# Patient Record
Sex: Female | Born: 1935
Health system: Southern US, Community
[De-identification: ages and names within clinical notes are randomized; demographics above are authoritative.]

## PROBLEM LIST (undated history)

## (undated) DIAGNOSIS — F0390 Unspecified dementia without behavioral disturbance: Secondary | ICD-10-CM

## (undated) DIAGNOSIS — M660 Rupture of popliteal cyst: Secondary | ICD-10-CM

## (undated) DIAGNOSIS — M5136 Other intervertebral disc degeneration, lumbar region: Secondary | ICD-10-CM

## (undated) DIAGNOSIS — I10 Essential (primary) hypertension: Secondary | ICD-10-CM

## (undated) DIAGNOSIS — M51369 Other intervertebral disc degeneration, lumbar region without mention of lumbar back pain or lower extremity pain: Secondary | ICD-10-CM

## (undated) DIAGNOSIS — N200 Calculus of kidney: Secondary | ICD-10-CM

## (undated) DIAGNOSIS — G243 Spasmodic torticollis: Secondary | ICD-10-CM

## (undated) DIAGNOSIS — Z8619 Personal history of other infectious and parasitic diseases: Secondary | ICD-10-CM

## (undated) DIAGNOSIS — I2510 Atherosclerotic heart disease of native coronary artery without angina pectoris: Secondary | ICD-10-CM

## (undated) DIAGNOSIS — E785 Hyperlipidemia, unspecified: Secondary | ICD-10-CM

## (undated) HISTORY — PX: CHOLECYSTECTOMY: SHX55

## (undated) HISTORY — PX: CARPAL TUNNEL RELEASE: SHX101

## (undated) HISTORY — PX: KIDNEY STONE SURGERY: SHX686

## (undated) HISTORY — PX: ABDOMINAL HYSTERECTOMY: SHX81

## (undated) HISTORY — PX: LUMBAR LAMINECTOMY: SHX95

---

## 1996-03-23 HISTORY — PX: CORONARY ARTERY BYPASS GRAFT: SHX141

## 2003-03-14 ENCOUNTER — Encounter (HOSPITAL_COMMUNITY): Admission: RE | Admit: 2003-03-14 | Discharge: 2003-03-23 | Payer: Self-pay | Admitting: Orthopedic Surgery

## 2003-03-27 ENCOUNTER — Encounter (HOSPITAL_COMMUNITY): Admission: RE | Admit: 2003-03-27 | Discharge: 2003-04-26 | Payer: Self-pay | Admitting: Orthopedic Surgery

## 2010-06-19 DIAGNOSIS — I498 Other specified cardiac arrhythmias: Secondary | ICD-10-CM

## 2011-05-22 DIAGNOSIS — R0602 Shortness of breath: Secondary | ICD-10-CM

## 2011-05-22 DIAGNOSIS — R079 Chest pain, unspecified: Secondary | ICD-10-CM

## 2011-05-25 ENCOUNTER — Inpatient Hospital Stay (HOSPITAL_COMMUNITY)
Admission: AD | Admit: 2011-05-25 | Discharge: 2011-05-27 | DRG: 287 | Disposition: A | Discharge status: Home / Self Care | Payer: Medicare Other | Source: Other Acute Inpatient Hospital | Attending: Cardiology | Admitting: Cardiology

## 2011-05-25 ENCOUNTER — Encounter (HOSPITAL_COMMUNITY): Payer: Self-pay | Admitting: Cardiology

## 2011-05-25 DIAGNOSIS — Z951 Presence of aortocoronary bypass graft: Secondary | ICD-10-CM

## 2011-05-25 DIAGNOSIS — Z79899 Other long term (current) drug therapy: Secondary | ICD-10-CM

## 2011-05-25 DIAGNOSIS — E785 Hyperlipidemia, unspecified: Secondary | ICD-10-CM

## 2011-05-25 DIAGNOSIS — I2510 Atherosclerotic heart disease of native coronary artery without angina pectoris: Secondary | ICD-10-CM

## 2011-05-25 DIAGNOSIS — K449 Diaphragmatic hernia without obstruction or gangrene: Secondary | ICD-10-CM | POA: Diagnosis present

## 2011-05-25 DIAGNOSIS — D649 Anemia, unspecified: Secondary | ICD-10-CM | POA: Diagnosis present

## 2011-05-25 DIAGNOSIS — I10 Essential (primary) hypertension: Secondary | ICD-10-CM | POA: Diagnosis present

## 2011-05-25 DIAGNOSIS — M5136 Other intervertebral disc degeneration, lumbar region: Secondary | ICD-10-CM | POA: Insufficient documentation

## 2011-05-25 DIAGNOSIS — K219 Gastro-esophageal reflux disease without esophagitis: Secondary | ICD-10-CM | POA: Diagnosis present

## 2011-05-25 DIAGNOSIS — R079 Chest pain, unspecified: Principal | ICD-10-CM | POA: Diagnosis present

## 2011-05-25 DIAGNOSIS — F4321 Adjustment disorder with depressed mood: Secondary | ICD-10-CM | POA: Diagnosis present

## 2011-05-25 DIAGNOSIS — Z7982 Long term (current) use of aspirin: Secondary | ICD-10-CM

## 2011-05-25 DIAGNOSIS — Z833 Family history of diabetes mellitus: Secondary | ICD-10-CM

## 2011-05-25 DIAGNOSIS — F0390 Unspecified dementia without behavioral disturbance: Secondary | ICD-10-CM | POA: Insufficient documentation

## 2011-05-25 DIAGNOSIS — N200 Calculus of kidney: Secondary | ICD-10-CM | POA: Insufficient documentation

## 2011-05-25 HISTORY — DX: Hyperlipidemia, unspecified: E78.5

## 2011-05-25 HISTORY — DX: Calculus of kidney: N20.0

## 2011-05-25 HISTORY — DX: Spasmodic torticollis: G24.3

## 2011-05-25 HISTORY — DX: Other intervertebral disc degeneration, lumbar region without mention of lumbar back pain or lower extremity pain: M51.369

## 2011-05-25 HISTORY — DX: Essential (primary) hypertension: I10

## 2011-05-25 HISTORY — DX: Unspecified dementia, unspecified severity, without behavioral disturbance, psychotic disturbance, mood disturbance, and anxiety: F03.90

## 2011-05-25 HISTORY — DX: Atherosclerotic heart disease of native coronary artery without angina pectoris: I25.10

## 2011-05-25 HISTORY — DX: Other intervertebral disc degeneration, lumbar region: M51.36

## 2011-05-25 HISTORY — DX: Personal history of other infectious and parasitic diseases: Z86.19

## 2011-05-25 HISTORY — DX: Rupture of popliteal cyst: M66.0

## 2011-05-25 LAB — CARDIAC PANEL(CRET KIN+CKTOT+MB+TROPI): Total CK: 167 U/L (ref 7–177)

## 2011-05-25 LAB — COMPREHENSIVE METABOLIC PANEL
ALT: 10 U/L (ref 0–35)
Alkaline Phosphatase: 78 U/L (ref 39–117)
CO2: 25 mEq/L (ref 19–32)
Calcium: 9 mg/dL (ref 8.4–10.5)
GFR calc Af Amer: 53 mL/min — ABNORMAL LOW (ref >90)
GFR calc non Af Amer: 46 mL/min — ABNORMAL LOW (ref >90)
Glucose, Bld: 109 mg/dL — ABNORMAL HIGH (ref 70–99)
Sodium: 139 mEq/L (ref 135–145)
Total Bilirubin: 0.2 mg/dL — ABNORMAL LOW (ref 0.3–1.2)

## 2011-05-25 LAB — CBC
HCT: 29.3 % — ABNORMAL LOW (ref 36.0–46.0)
MCV: 86.4 fL (ref 78.0–100.0)
Platelets: 217 10*3/uL (ref 150–400)
RBC: 3.39 MIL/uL — ABNORMAL LOW (ref 3.87–5.11)
WBC: 4.4 10*3/uL (ref 4.0–10.5)

## 2011-05-25 LAB — DIFFERENTIAL
Eosinophils Relative: 3 % (ref 0–5)
Lymphocytes Relative: 36 % (ref 12–46)
Lymphs Abs: 1.6 10*3/uL (ref 0.7–4.0)

## 2011-05-25 LAB — MRSA PCR SCREENING: MRSA by PCR: NEGATIVE

## 2011-05-25 MED ORDER — NITROGLYCERIN 0.4 MG SL SUBL: 0.4000 mg | SUBLINGUAL_TABLET | SUBLINGUAL | Status: DC | PRN

## 2011-05-25 MED ORDER — DIAZEPAM 5 MG PO TABS
5.0000 mg | ORAL_TABLET | ORAL | Status: AC
  Administered 2011-05-26: 5 mg via ORAL
  Filled 2011-05-25: qty 1

## 2011-05-25 MED ORDER — ASPIRIN 81 MG PO CHEW: 81.0000 mg | CHEWABLE_TABLET | Freq: Every day | ORAL | Status: DC

## 2011-05-25 MED ORDER — SODIUM CHLORIDE 0.9 % IV SOLN
INTRAVENOUS | Status: DC
  Administered 2011-05-25: via INTRAVENOUS

## 2011-05-25 MED ORDER — ASPIRIN EC 81 MG PO TBEC
81.0000 mg | DELAYED_RELEASE_TABLET | Freq: Every day | ORAL | Status: DC
  Administered 2011-05-26 – 2011-05-27 (×2): 81 mg via ORAL
  Filled 2011-05-25 (×2): qty 1

## 2011-05-25 MED ORDER — OMEGA-3-ACID ETHYL ESTERS 1 G PO CAPS
2.0000 g | ORAL_CAPSULE | Freq: Two times a day (BID) | ORAL | Status: DC
  Administered 2011-05-25 – 2011-05-27 (×4): 2 g via ORAL
  Filled 2011-05-25 (×6): qty 2

## 2011-05-25 MED ORDER — LISINOPRIL 20 MG PO TABS
20.0000 mg | ORAL_TABLET | Freq: Every day | ORAL | Status: DC
  Administered 2011-05-25 – 2011-05-27 (×3): 20 mg via ORAL
  Filled 2011-05-25 (×3): qty 1

## 2011-05-25 MED ORDER — ACETAMINOPHEN 325 MG PO TABS: 650.0000 mg | ORAL_TABLET | ORAL | Status: DC | PRN

## 2011-05-25 MED ORDER — ONDANSETRON HCL 4 MG/2ML IJ SOLN
4.0000 mg | Freq: Four times a day (QID) | INTRAMUSCULAR | Status: DC | PRN
  Administered 2011-05-25: 4 mg via INTRAVENOUS
  Filled 2011-05-25: qty 2

## 2011-05-25 MED ORDER — MECLIZINE HCL 12.5 MG PO TABS
12.5000 mg | ORAL_TABLET | Freq: Every day | ORAL | Status: DC | PRN
  Filled 2011-05-25: qty 1

## 2011-05-25 MED ORDER — NITROGLYCERIN IN D5W 200-5 MCG/ML-% IV SOLN
10.0000 ug/min | INTRAVENOUS | Status: DC
  Administered 2011-05-25: 10 ug/min via INTRAVENOUS

## 2011-05-25 MED ORDER — PANTOPRAZOLE SODIUM 40 MG PO TBEC
40.0000 mg | DELAYED_RELEASE_TABLET | Freq: Every day | ORAL | Status: DC
  Administered 2011-05-25 – 2011-05-27 (×3): 40 mg via ORAL
  Filled 2011-05-25 (×3): qty 1

## 2011-05-25 MED ORDER — ENOXAPARIN SODIUM 40 MG/0.4ML ~~LOC~~ SOLN
40.0000 mg | SUBCUTANEOUS | Status: DC
  Administered 2011-05-25 – 2011-05-26 (×2): 40 mg via SUBCUTANEOUS
  Filled 2011-05-25 (×4): qty 0.4

## 2011-05-25 MED ORDER — GEMFIBROZIL 600 MG PO TABS
600.0000 mg | ORAL_TABLET | Freq: Two times a day (BID) | ORAL | Status: DC
  Administered 2011-05-26 – 2011-05-27 (×3): 600 mg via ORAL
  Filled 2011-05-25 (×6): qty 1

## 2011-05-25 MED ORDER — VITAMIN C 500 MG PO TABS
500.0000 mg | ORAL_TABLET | Freq: Every day | ORAL | Status: DC
  Administered 2011-05-25 – 2011-05-27 (×3): 500 mg via ORAL
  Filled 2011-05-25 (×3): qty 1

## 2011-05-25 NOTE — H&P (Signed)
Admit date: 05/25/2011 Name:  Gloria Rowland Medical record number: [id number] DOB/Age:  76-Jul-1934  76 y.o.  Primary Cardiologist: Dr. Viann Fish  Primary Physician: Dr. Loreen Freud  Chief complaint/reason for admission:  Chest pain and shortness of breath  HPI:  This 78 year old female has a history of coronary artery disease with mammary bypass grafting to the LAD and diagonal branch in 1998. At that time she had insignificant disease involving her right coronary artery and circumflex. She said negative Cardiolite since then the last being in 2008. She has had significant anxiety as well as chronic low back pain and has been diagnosed with dementia. She has been on Aricept and Celexa over the past year. She developed dyspnea while in church about a week ago and presented to [facility] on March 1 with worsening dyspnea and was noted to be hyperventilating. She complained of anterior chest pain and pressure like pain radiating through to her back. She had a mildly elevated d-dimer but a negative CT scan and had an echocardiogram during pain that was normal. Over the weekend she is continued to complain of chest discomfort with normal EKGs during pain and was transferred here for further evaluation.   Past Medical History  Diagnosis Date  . CAD (coronary artery disease)   . Hyperlipidemia   . Hypertension   . Nephrolithiasis   . Lumbar degenerative disc disease   . Spasmodic torticollis   . History of hepatitis   . Baker's cyst, ruptured     Past Surgical History  Procedure Date  . Coronary artery bypass graft 1998    LIMA to LAD-Dx  . Carpal tunnel release   . Abdominal hysterectomy   . Lumbar laminectomy   . Kidney stone surgery   . Cholecystectomy     Allergies: is allergic to aspirin and codeine.    Medications:  Prior to Admission medications   Medication Sig Start Date End Date Taking? Authorizing Provider  aspirin 81 MG chewable tablet Chew 81 mg by mouth  daily.   Yes Historical Provider, MD  citalopram (CELEXA) 10 MG tablet Take 10 mg by mouth daily.   Yes Historical Provider, MD  donepezil (ARICEPT) 10 MG tablet Take 10 mg by mouth daily.   Yes Historical Provider, MD  gemfibrozil (LOPID) 600 MG tablet Take 600 mg by mouth 2 (two) times daily before a meal.   Yes Historical Provider, MD  lisinopril (PRINIVIL,ZESTRIL) 20 MG tablet Take 20 mg by mouth daily.   Yes Historical Provider, MD  meclizine (ANTIVERT) 12.5 MG tablet Take 12.5 mg by mouth daily as needed. For headache,dizziness   Yes Historical Provider, MD  nitroGLYCERIN (NITROSTAT) 0.4 MG SL tablet Place 0.4 mg under the tongue every 5 (five) minutes as needed. For chest pain   Yes Historical Provider, MD  omega-3 acid ethyl esters (LOVAZA) 1 G capsule Take 2 g by mouth 2 (two) times daily.   Yes Historical Provider, MD  omeprazole (PRILOSEC) 20 MG capsule Take 20 mg by mouth 2 (two) times daily.   Yes Historical Provider, MD  vitamin C (ASCORBIC ACID) 500 MG tablet Take 500 mg by mouth daily.   Yes Historical Provider, MD   Family History:  Positive for heart disease and diabetes  Social History:   reports that she has never smoked. She has never used smokeless tobacco. She reports that she does not drink alcohol or use illicit drugs.   History   Social History Narrative   Divorced, remarried.  Review of Systems:  Psychological ROS: Significant history of depression as well as anxiety over the past. Respiratory ROS: She has significant dyspnea and exertion gives out with most any level of activity. Cardiovascular ROS: She does not have any significant palpitations but has had some mild bradycardia. No PND or orthopnea. Gastrointestinal ROS: She does have some reflux and has significant burping at times. Genito-Urinary ROS: She has urinary incontinence and wears a pessary. Musculoskeletal ROS: Significant low back pain that she is limited with and has affected her  gait Neurological ROS: She does have some memory loss and was placed on Aricept but was recently taken off of this. Other than as noted above, the remainder of the review of systems is normal  Physical Exam: BP 118/63  Pulse 55  Temp(Src) 98 F (36.7 C) (Oral)  Resp 14  Ht 5\' 5"  (1.651 m)  Wt 65 kg (143 lb 4.8 oz)  BMI 23.85 kg/m2  SpO2 99% General appearance: alert, appears older than stated age and no distress Head: Normocephalic, without obvious abnormality, atraumatic Eyes: conjunctivae/corneas clear. PERRL, EOM's intact. Fundi benign. Throat: lips, mucosa, and tongue normal; teeth and gums normal Neck: no adenopathy, no carotid bruit, no JVD and supple, symmetrical, trachea midline Back: symmetric, no curvature. ROM normal. No CVA tenderness. Lungs: clear to auscultation bilaterally Heart: regular rate and rhythm, S1, S2 normal, no murmur, click, rub or gallop Abdomen: soft, non-tender; bowel sounds normal; no masses,  no organomegaly Pelvic: deferred Extremities: No edema present. Complains of mild tenderness in her lower legs Pulses: Posterior tibial pulses are 2+ bilaterally dorsalis pedis are 1+, no femoral bruits noted Skin: Skin color, texture, turgor normal. No rashes or lesions Neurologic: Grossly normal she is oriented to date as well as the hospital  EKG: Normal  IMPRESSIONS:  1. Ongoing chest pain with some features suggestive of unstable angina. Despite a negative echocardiogram and EKG during pain, she has ongoing symptoms of dyspnea and chest pain and we will need to reassess her for coronary artery disease. 2. Anemia 3. Hypertension 4. Hyperlipidemia under treatment 5. Dementia 6 French anxiety and depression 7. History of coronary artery disease with severe LAD disease treated with mammary graft to the LAD  PLAN: She is admitted to a step down bed because of ongoing chest pain. We will plan to proceed with cardiac catheterization in the morning.  Cardiaccatheterization was discussed with the patient fully including risks of myocardial infarction, death, stroke, bleeding, arrhythmia, dye allergy, renal insufficiency or bleeding.  The patient understands and is willing to proceed. She does have anemia and we will need to reevaluate her for this. Add proton pump inhibitor.   Signed: Darden Palmer MD [facility]  05/25/2011, 5:33 PM

## 2011-05-26 ENCOUNTER — Encounter (HOSPITAL_COMMUNITY)
Admission: AD | Disposition: A | Discharge status: Home / Self Care | Payer: Self-pay | Source: Other Acute Inpatient Hospital | Attending: Cardiology

## 2011-05-26 ENCOUNTER — Encounter (HOSPITAL_COMMUNITY): Payer: Self-pay | Admitting: Cardiology

## 2011-05-26 HISTORY — PX: LEFT HEART CATHETERIZATION WITH CORONARY/GRAFT ANGIOGRAM: SHX5450

## 2011-05-26 LAB — LIPID PANEL
Cholesterol: 187 mg/dL (ref 0–200)
LDL Cholesterol: 97 mg/dL (ref 0–99)
VLDL: 50 mg/dL — ABNORMAL HIGH (ref 0–40)

## 2011-05-26 SURGERY — LEFT HEART CATHETERIZATION WITH CORONARY/GRAFT ANGIOGRAM
Anesthesia: LOCAL

## 2011-05-26 MED ORDER — MAGNESIUM HYDROXIDE 400 MG/5ML PO SUSP
30.0000 mL | Freq: Every day | ORAL | Status: DC | PRN
  Administered 2011-05-26: 30 mL via ORAL
  Filled 2011-05-26: qty 30

## 2011-05-26 MED ORDER — ACETAMINOPHEN 325 MG PO TABS: 650.0000 mg | ORAL_TABLET | ORAL | Status: DC | PRN

## 2011-05-26 MED ORDER — HEPARIN (PORCINE) IN NACL 2-0.9 UNIT/ML-% IJ SOLN
INTRAMUSCULAR | Status: AC
  Filled 2011-05-26: qty 2000

## 2011-05-26 MED ORDER — AMLODIPINE BESYLATE 5 MG PO TABS
5.0000 mg | ORAL_TABLET | Freq: Every day | ORAL | Status: DC
  Administered 2011-05-26 – 2011-05-27 (×2): 5 mg via ORAL
  Filled 2011-05-26 (×2): qty 1

## 2011-05-26 MED ORDER — NITROGLYCERIN 0.2 MG/ML ON CALL CATH LAB
INTRAVENOUS | Status: AC
  Filled 2011-05-26: qty 1

## 2011-05-26 MED ORDER — SODIUM CHLORIDE 0.9 % IV SOLN: 1.0000 mL/kg/h | INTRAVENOUS | Status: DC

## 2011-05-26 MED ORDER — LIDOCAINE HCL (PF) 1 % IJ SOLN
INTRAMUSCULAR | Status: AC
  Filled 2011-05-26: qty 30

## 2011-05-26 MED ORDER — SODIUM CHLORIDE 0.9 % IV SOLN
1.0000 mL/kg/h | INTRAVENOUS | Status: AC
  Administered 2011-05-26: 65 mL/h via INTRAVENOUS

## 2011-05-26 MED FILL — Morphine Sulfate Inj 10 MG/ML: INTRAMUSCULAR | Qty: 1 | Status: AC

## 2011-05-26 MED FILL — Nitroglycerin SL Tab 0.4 MG: SUBLINGUAL | Qty: 1 | Status: AC

## 2011-05-26 MED FILL — Nitroglycerin IV Soln 200 MCG/ML in D5W: INTRAVENOUS | Qty: 250 | Status: AC

## 2011-05-26 NOTE — Progress Notes (Signed)
Pt returned back from Cath Lab, VSS, pt resting and comfortable, pt assessment done

## 2011-05-26 NOTE — Consult Note (Signed)
[facility] Consult Note  Referring Provider: No ref. provider found Primary Care Physician:  Ignatius Specking., MD, MD Primary Gastroenterologist:  Dr.  Antony Contras Complaint: Shortness of breath and chest tightness HPI: Gloria Rowland is an 78 y.o. white female.  who was transferred here from [facility] after a workup for the above symptoms. She first noted some very transient chest pain while in church 9 days ago. This subsided but since then she began to notice a chest tightness radiating to the back with shortness of breath. She notes no radiation to the arms and no association to meals or bowel movements. She does not have any dysphagia. She has chronic reflux and is familiar with those symptoms and they are well suppressed on Prevacid that she has been taking twice a day for some time. After her symptoms persisted she was seen by her primary physician who admitted her for a work up about 4 days ago. She had several tests outlined by Dr. Donnie Aho apparently including an abdominal and possibly chest CT scan and echocardiogram there were all unrevealing. It was decided to transfer her here for cardiac catheterization which was done today and is apparently normal.  Her cardiac catheterization apparently revealed no specific abnormality we're consulted for possible esophageal or GI source of her pain. She's had a cholecystectomy in the remote past. As mentioned above there is no association of her pain to meals and she has been able to eat normally previous and clearly distinguish as her current symptoms from familiar reflux.     Past Medical History  Diagnosis Date  . CAD (coronary artery disease)   . Hyperlipidemia   . Hypertension   . Nephrolithiasis   . Lumbar degenerative disc disease   . Spasmodic torticollis   . History of hepatitis   . Baker's cyst, ruptured     Past Surgical History  Procedure Date  . Coronary artery bypass graft 1998    LIMA to LAD-Dx  . Carpal  tunnel release   . Abdominal hysterectomy   . Lumbar laminectomy   . Kidney stone surgery   . Cholecystectomy     Medications Prior to Admission  Medication Dose Route Frequency Provider Last Rate Last Dose  . 0.9 %  sodium chloride infusion  1 mL/kg/hr Intravenous Continuous W Ashley Royalty., MD 65 mL/hr at 05/26/11 1050 65 mL/hr at 05/26/11 1050  . acetaminophen (TYLENOL) tablet 650 mg  650 mg Oral Q4H PRN W Ashley Royalty., MD      . aspirin EC tablet 81 mg  81 mg Oral Daily W Ashley Royalty., MD   81 mg at 05/26/11 1049  . diazepam (VALIUM) tablet 5 mg  5 mg Oral On Call W Ashley Royalty., MD   5 mg at 05/26/11 [phone]  . enoxaparin (LOVENOX) injection 40 mg  40 mg Subcutaneous Q24H W Ashley Royalty., MD   40 mg at 05/25/11 1837  . gemfibrozil (LOPID) tablet 600 mg  600 mg Oral BID AC W Ashley Royalty., MD   600 mg at 05/26/11 1049  . heparin 2-0.9 UNIT/ML-% infusion           . lidocaine (XYLOCAINE) 1 % injection           . lisinopril (PRINIVIL,ZESTRIL) tablet 20 mg  20 mg Oral Daily W Ashley Royalty., MD   20 mg at 05/26/11 1049  . meclizine (ANTIVERT) tablet 12.5 mg  12.5 mg Oral Daily PRN W Viann Fish  Montez Hageman., MD      . nitroGLYCERIN (NITROSTAT) SL tablet 0.4 mg  0.4 mg Sublingual Q5 Min x 3 PRN W Ashley Royalty., MD      . nitroGLYCERIN (NTG ON-CALL) 0.2 mg/mL injection           . nitroGLYCERIN 0.2 mg/mL in dextrose 5 % infusion  10 mcg/min Intravenous Continuous W Ashley Royalty., MD 3 mL/hr at 05/26/11 0600 10 mcg/min at 05/26/11 0600  . omega-3 acid ethyl esters (LOVAZA) capsule 2 g  2 g Oral BID W Ashley Royalty., MD   2 g at 05/26/11 1049  . ondansetron (ZOFRAN) injection 4 mg  4 mg Intravenous Q6H PRN W Ashley Royalty., MD   4 mg at 05/25/11 2013  . pantoprazole (PROTONIX) EC tablet 40 mg  40 mg Oral Q1200 W Ashley Royalty., MD   40 mg at 05/25/11 1836  . vitamin C (ASCORBIC ACID) tablet 500 mg  500 mg Oral Daily W Ashley Royalty., MD    500 mg at 05/26/11 1049  . DISCONTD: 0.9 %  sodium chloride infusion   Intravenous Continuous W Ashley Royalty., MD 50 mL/hr at 05/25/11 2335    . DISCONTD: 0.9 %  sodium chloride infusion  1 mL/kg/hr Intravenous Continuous W Ashley Royalty., MD      . DISCONTD: acetaminophen (TYLENOL) tablet 650 mg  650 mg Oral Q4H PRN W Ashley Royalty., MD      . DISCONTD: aspirin chewable tablet 81 mg  81 mg Oral Daily W Ashley Royalty., MD      . DISCONTD: nitroGLYCERIN (NITROSTAT) SL tablet 0.4 mg  0.4 mg Sublingual Q5 min PRN W Ashley Royalty., MD       No current outpatient prescriptions on file as of 05/26/2011.    Allergies:  Allergies  Allergen Reactions  . Aspirin     Upset stomach  . Codeine     Upset stomach    Family History  Problem Relation Age of Onset  . Diabetes Sister   . Diabetes Sister     Social History:  reports that she has never smoked. She has never used smokeless tobacco. She reports that she does not drink alcohol or use illicit drugs.  Negative except for as above   Blood pressure 135/81, pulse 46, temperature 98.6 F (37 C), temperature source Oral, resp. rate 17, height 5\' 5"  (1.651 m), weight 65 kg (143 lb 4.8 oz), SpO2 100.00%. Head: Normocephalic, without obvious abnormality, atraumatic Neck: no adenopathy, no carotid bruit, no JVD, supple, symmetrical, trachea midline and thyroid not enlarged, symmetric, no tenderness/mass/nodules Resp: clear to auscultation bilaterally Cardio: regular rate and rhythm, S1, S2 normal, no murmur, click, rub or gallop GI: Abdomen soft nondistended with normoactive bowel sounds no hepatomegaly masses or guarding. Extremities: extremities normal, atraumatic, no cyanosis or edema  Results for orders placed during the hospital encounter of 05/25/11 (from the past 48 hour(s))  MRSA PCR SCREENING     Status: Normal   Collection Time   05/25/11  4:15 PM      Component Value Range Comment   MRSA by PCR NEGATIVE  NEGATIVE     CARDIAC PANEL(CRET KIN+CKTOT+MB+TROPI)     Status: Normal   Collection Time   05/25/11  6:09 PM      Component Value Range Comment   Total CK 167  7 - 177 (U/L)    CK, MB 3.3  0.3 - 4.0 (ng/mL)  Troponin I <0.30  <0.30 (ng/mL)    Relative Index 2.0  0.0 - 2.5    PROTIME-INR     Status: Normal   Collection Time   05/25/11  6:09 PM      Component Value Range Comment   Prothrombin Time 14.2  11.6 - 15.2 (seconds)    INR 1.08  0.00 - 1.49    APTT     Status: Normal   Collection Time   05/25/11  6:09 PM      Component Value Range Comment   aPTT 29  24 - 37 (seconds)   CBC     Status: Abnormal   Collection Time   05/25/11  6:09 PM      Component Value Range Comment   WBC 4.4  4.0 - 10.5 (K/uL)    RBC 3.39 (*) 3.87 - 5.11 (MIL/uL)    Hemoglobin 10.0 (*) 12.0 - 15.0 (g/dL)    HCT 16.1 (*) 09.6 - 46.0 (%)    MCV 86.4  78.0 - 100.0 (fL)    MCH 29.5  26.0 - 34.0 (pg)    MCHC 34.1  30.0 - 36.0 (g/dL)    RDW 04.5  40.9 - 81.1 (%)    Platelets 217  150 - 400 (K/uL)   DIFFERENTIAL     Status: Normal   Collection Time   05/25/11  6:09 PM      Component Value Range Comment   Neutrophils Relative 51  43 - 77 (%)    Neutro Abs 2.3  1.7 - 7.7 (K/uL)    Lymphocytes Relative 36  12 - 46 (%)    Lymphs Abs 1.6  0.7 - 4.0 (K/uL)    Monocytes Relative 10  3 - 12 (%)    Monocytes Absolute 0.4  0.1 - 1.0 (K/uL)    Eosinophils Relative 3  0 - 5 (%)    Eosinophils Absolute 0.1  0.0 - 0.7 (K/uL)    Basophils Relative 0  0 - 1 (%)    Basophils Absolute 0.0  0.0 - 0.1 (K/uL)   COMPREHENSIVE METABOLIC PANEL     Status: Abnormal   Collection Time   05/25/11  6:09 PM      Component Value Range Comment   Sodium 139  135 - 145 (mEq/L)    Potassium 3.6  3.5 - 5.1 (mEq/L)    Chloride 104  96 - 112 (mEq/L)    CO2 25  19 - 32 (mEq/L)    Glucose, Bld 109 (*) 70 - 99 (mg/dL)    BUN 18  6 - 23 (mg/dL)    Creatinine, Ser 9.14 (*) 0.50 - 1.10 (mg/dL)    Calcium 9.0  8.4 - 10.5 (mg/dL)    Total Protein 6.6  6.0  - 8.3 (g/dL)    Albumin 3.3 (*) 3.5 - 5.2 (g/dL)    AST 17  0 - 37 (U/L)    ALT 10  0 - 35 (U/L)    Alkaline Phosphatase 78  39 - 117 (U/L)    Total Bilirubin 0.2 (*) 0.3 - 1.2 (mg/dL)    GFR calc non Af Amer 46 (*) >90 (mL/min)    GFR calc Af Amer 53 (*) >90 (mL/min)   HEMOGLOBIN A1C     Status: Abnormal   Collection Time   05/25/11  6:09 PM      Component Value Range Comment   Hemoglobin A1C 6.0 (*) <5.7 (%)    Mean Plasma Glucose 126 (*) <117 (  mg/dL)   LIPID PANEL     Status: Abnormal   Collection Time   05/26/11  4:00 AM      Component Value Range Comment   Cholesterol 187  0 - 200 (mg/dL)    Triglycerides 161 (*) <150 (mg/dL)    HDL 40  >09 (mg/dL)    Total CHOL/HDL Ratio 4.7      VLDL 50 (*) 0 - 40 (mg/dL)    LDL Cholesterol 97  0 - 99 (mg/dL)    No results found.  Assessment: Chest discomfort and dyspnea with no features to suggest an esophageal or otherwise gastrointestinal etiology. Plan:  As she is already on double dose proton pump inhibitor and her symptoms being atypical for an esophageal etiology, no definite recommendations from a GI standpoint.. It would not be unreasonable to obtain an upper GI series to rule out a large hiatal hernia although I would expect this would've been picked up on the CT scan which apparently was done at the referring hospital. Please call to discuss if needed at [phone]. Will followup tomorrow as well. Jyra Lagares C 05/26/2011, 11:40 AM

## 2011-05-26 NOTE — Interval H&P Note (Signed)
History and Physical Interval Note:  05/26/2011 7:47 AM  Gloria Rowland  has presented today for surgery, with the diagnosis of chest pain  The various methods of treatment have been discussed with the patient and family. After consideration of risks, benefits and other options for treatment, the patient has consented to  Procedure(s) (LRB): LEFT HEART CATHETERIZATION WITH CORONARY/GRAFT ANGIOGRAM (N/A) as a surgical intervention .  The patients' history has been reviewed, patient examined, no change in status, stable for surgery.  I have reviewed the patients' chart and labs.  Questions were answered to the patient's satisfaction.     Gloria Rowland,W SPENCER    Patient seen and examined.  No interval change in history and exam since last note.  Stable for procedure.  Darden Palmer. MD [facility]  05/26/2011

## 2011-05-26 NOTE — CV Procedure (Signed)
Cardiac Catheterization Report   Gloria Rowland  [birth year]  [id number]  05/26/2011   PROCEDURE:  Left heart catheterization with selective coronary angiography, left ventriculogram.  INDICATIONS:  Unstable angina, prior coronary bypass grafting  The risks, benefits, and details of the procedure were explained to the patient.  The patient verbalized understanding and wanted to proceed.  Informed written consent was obtained.  PROCEDURE TECHNIQUE:  After Xylocaine anesthesia a 1F sheath was placed in the right femoral artery with a single anterior needle wall stick.   Left coronary angiography was done using a Judkins L4 guide catheter.  Right coronary angiography was done using a no torque Judkins R4 guide catheter. A mammary catheter was selected using a internal mammary catheter. A pigtail catheter was placed the right ventricle and a 30 cc ventriculogram was performed and she was taken to the holding area for sheath removal. She tolerated the procedure well.   CONTRAST:  Total of 80 cc.  COMPLICATIONS:  None.    HEMODYNAMICS:  Aortic postcontrast 152/69 LV postcontrast 152/6-14.  There was no gradient between the left ventricle and aorta.    ANGIOGRAPHIC DATA:    CORONARY ARTERIES:   Arise and distribute normally.  Right dominant. Significant coronary calcification is noted.  Left main coronary artery: Mild calcification of the distal left main with mild 10- 20% narrowing..  Left anterior descending: Calcified and occluded proximally. Fills by collaterals from the sequential mammary graft.  Circumflex coronary artery: Calcified with moderate ectasia and tortuosity but no significant focal narrowing noted..  Right coronary artery: Calcified somewhat. There is a moderate 40% ostial stenosis. Moderate irregularities noted but no significant obstructive stenosis is noted.  Internal mammary graft to LAD/diagonal: Widely patent.  LEFT  VENTRICULOGRAM:  Performed in the 30 RAO projection.  The aortic valve is normal. There is dense mitral annular calcification noted. The left ventricle is normal in size with normal wall motion. The estimated ejection fraction is 60%.   IMPRESSIONS:  1. Widely patent internal mammary graft done sequentially to the LAD and diagonal branch with occlusion of the LAD 2. Calcification and mild coronary disease involving the circumflex and right coronary artery 3. Normal left ventricle.Marland Kitchen  RECOMMENDATION:  Evaluation for other sources of pain.Darden Palmer MD [facility]

## 2011-05-26 NOTE — Progress Notes (Signed)
Subjective:  Still c/o mild chest pain. Dysphagia at times but controlled with Prevacid.  Spoke to Dr. Madilyn Fireman. Plan to get a barium swallow tomorrow.  Also has anemia that will need to be investigated.  Objective:  Vital Signs in the last 24 hours: BP 114/56  Pulse 57  Temp(Src) 98.4 F (36.9 C) (Oral)  Resp 18  Ht 5\' 5"  (1.651 m)  Wt 65 kg (143 lb 4.8 oz)  BMI 23.85 kg/m2  SpO2 100%  Physical Exam: Pleasant WF in NAD Extremities:  Cath site OK  Intake/Output from previous day: 03/04 0701 - 03/05 0700 In: 452 [I.V.:450; IV Piggyback:2] Out: 725 [Urine:725]  Lab Results: Basic Metabolic Panel:  [facility] 05/25/11 1809  NA 139  K 3.6  CL 104  CO2 25  GLUCOSE 109*  BUN 18  CREATININE 1.13*    CBC:  [facility] 05/25/11 1809  WBC 4.4  NEUTROABS 2.3  HGB 10.0*  HCT 29.3*  MCV 86.4  PLT 217    PROTIME: Lab Results  Component Value Date   INR 1.08 05/25/2011    Assessment/Plan:  1. Chest pain of unknown etiology grafts patent and no new CAD,   2. Anemia  Plan:  Move to floor and check UGI,  Also add calcium blocker for possible spasm.  Darden Palmer.  MD [facility] 05/26/2011, 4:17 PM

## 2011-05-27 ENCOUNTER — Inpatient Hospital Stay (HOSPITAL_COMMUNITY): Payer: Medicare Other

## 2011-05-27 ENCOUNTER — Encounter (HOSPITAL_COMMUNITY): Payer: Self-pay | Admitting: Cardiology

## 2011-05-27 DIAGNOSIS — K449 Diaphragmatic hernia without obstruction or gangrene: Secondary | ICD-10-CM

## 2011-05-27 LAB — CBC
HCT: 29.7 % — ABNORMAL LOW (ref 36.0–46.0)
MCV: 88.7 fL (ref 78.0–100.0)
RDW: 14 % (ref 11.5–15.5)
WBC: 3.9 10*3/uL — ABNORMAL LOW (ref 4.0–10.5)

## 2011-05-27 MED ORDER — PANTOPRAZOLE SODIUM 40 MG PO TBEC: 40.0000 mg | DELAYED_RELEASE_TABLET | Freq: Two times a day (BID) | ORAL | Status: DC

## 2011-05-27 MED ORDER — AMLODIPINE BESYLATE 5 MG PO TABS: 5.0000 mg | ORAL_TABLET | Freq: Every day | ORAL | Status: DC

## 2011-05-27 NOTE — Discharge Summary (Signed)
Physician Discharge Summary  Patient ID: HAJRA PORT MRN: [id number] DOB/AGE: 05-24-1932 78 y.o.  Admit date: 05/25/2011 Discharge date: 05/27/2011  Primary Physician: Dr. Loreen Freud  Primary Discharge Diagnosis: 1. Chest pain of undetermined etiology-catheterization showed stable coronary artery disease  Secondary Discharge Diagnosis: 2. Dementia 3. Anemia of undetermined etiology 4. Situational stress and depression 5. Coronary artery disease with previous bypass graft with a mammary graft to LAD 6. Hypertension currently controlled 7. Hiatal hernia  Procedures: Cardiac catheterization, upper GI series  Consults:  Dr. Jonny Ruiz Hayes-gastroenterology  [facility] Course: This 78 year old female has a history of coronary artery disease with mammary bypass grafting to the LAD and diagonal branch in 1998. At that time she had insignificant disease involving her right coronary artery and circumflex. She said negative Cardiolite since then the last being in 2008. She has had significant anxiety as well as chronic low back pain and has been diagnosed with dementia. She has been on Aricept and Celexa over the past year. She developed dyspnea while in church about a week ago and presented to [facility] on March 1 with worsening dyspnea and was noted to be hyperventilating. She complained of anterior chest pain and pressure like pain radiating through to her back. She had a mildly elevated d-dimer but a negative CT scan and had an echocardiogram during pain that was normal. Over the weekend she continued to complain of chest discomfort with normal EKGs during pain and was transferred here for further evaluation in light of not being able to control her pain. She had has had significant dementia and had some episodes of impulsive behavior a year or so ago.  The patient continued to have chest discomfort on intravenous nitroglycerin. She was taken to the cardiac catheterization laboratory on 3/5  and had a patent sequential mammary graft to the LAD and diagonal. There was some calcification and mild disease involving the right coronary artery at the ostium as well as the circumflex coronary artery but no significant obstructive stenosis was noted He LAD was occluded. Her nitroglycerin was tapered and she was placed on high-dose proton pump inhibitor. She was seen by Dr. Dorena Cookey who recommended an upper GI series in light of some dysphagia but did not recommend endoscopy.  The upper GI series showed a small hiatal hernia relatively normal peristalsis and no evidence of ulcer or mass. It should be noted that her Aricept was discontinued prior to transfer here because of bradycardia and after conversation with her primary physician we will leave her off of this. It should be noted that she remained anemic while she was in the hospital and an outpatient anemia workup should be done.  Discharge Exam: Blood pressure 114/53, pulse 93, temperature 97.6 F (36.4 C), temperature source Oral, resp. rate 18, height 5\' 5"  (1.651 m), weight 65 kg (143 lb 4.8 oz), SpO2 97.00%.   Catheterization site is clean and dry, her lungs were clear  Labs: CBC:   Lab Results  Component Value Date   WBC 3.9* 05/27/2011   HGB 9.9* 05/27/2011   HCT 29.7* 05/27/2011   MCV 88.7 05/27/2011   PLT 196 05/27/2011   CMP:  Lab 05/25/11 1809  NA 139  K 3.6  CL 104  CO2 25  BUN 18  CREATININE 1.13*  CALCIUM 9.0  PROT 6.6  BILITOT 0.2*  ALKPHOS 78  ALT 10  AST 17  GLUCOSE 109*   Lipid Panel     Component Value Date/Time   CHOL  187 05/26/2011 0400   TRIG 249* 05/26/2011 0400   HDL 40 05/26/2011 0400   CHOLHDL 4.7 05/26/2011 0400   VLDL 50* 05/26/2011 0400   LDLCALC 97 05/26/2011 0400   Cardiac Enzymes:  Basename 05/25/11 1809  CKTOTAL 167  CKMB 3.3  CKMBINDEX --  TROPONINI <0.30   Discharge Medications:  Zaina, Jenkin  Home Medication Instructions RUE:[id number]   Printed on:05/27/11 1227  Medication  Information                    aspirin 81 MG chewable tablet Chew 81 mg by mouth daily.           gemfibrozil (LOPID) 600 MG tablet Take 600 mg by mouth 2 (two) times daily before a meal.           citalopram (CELEXA) 10 MG tablet Take 10 mg by mouth daily.           lisinopril (PRINIVIL,ZESTRIL) 20 MG tablet Take 20 mg by mouth daily.           vitamin C (ASCORBIC ACID) 500 MG tablet Take 500 mg by mouth daily.           omega-3 acid ethyl esters (LOVAZA) 1 G capsule Take 2 g by mouth 2 (two) times daily.           nitroGLYCERIN (NITROSTAT) 0.4 MG SL tablet Place 0.4 mg under the tongue every 5 (five) minutes as needed. For chest pain           meclizine (ANTIVERT) 12.5 MG tablet Take 12.5 mg by mouth daily as needed. For headache,dizziness           amLODipine (NORVASC) 5 MG tablet Take 1 tablet (5 mg total) by mouth daily.           pantoprazole (PROTONIX) 40 MG tablet Take 1 tablet (40 mg total) by mouth 2 (two) times daily.             Followup plans and appointments:  Followup with Dr. Sherril Croon in one week. In addition she should follow up with me in the next several months. She needs to have an outpatient anemia workup done through Dr. Sherril Croon. She will be discharged on amlodipine as well as a higher dose of pantoprazole.    Time spent with patient to include physician time:  35 minutes  Signed: W. Ashley Royalty MD [facility] 05/27/2011, 12:27 PM

## 2011-05-27 NOTE — Progress Notes (Signed)
   CARE MANAGEMENT NOTE 05/27/2011  Patient:  Gloria Rowland, Gloria Rowland   Account Number:  [id number]  Date Initiated:  05/27/2011  Documentation initiated by:  GRAVES-BIGELOW,Journiee Feldkamp  Subjective/Objective Assessment:   Pt admitted with cp. CM received referral for medication assistance and home needs. Pt is form home with husband and they are pretty active. pt has walker at  home. Pt has great family support. She gets meds at [facility] and then via mail order     Action/Plan:   CM discussed cost with her and her co pay is not an issue- she can afford medications. Pt stated will not need any HH care at this time and if she feels she needs it she ca ask MD Vias for referral.   Anticipated DC Date:  05/27/2011   Anticipated DC Plan:  HOME/SELF CARE      DC Planning Services  CM consult      Choice offered to / List presented to:             Status of service:  Completed, signed off Medicare Important Message given?   (If response is "NO", the following Medicare IM given date fields will be blank) Date Medicare IM given:   Date Additional Medicare IM given:    Discharge Disposition:  HOME/SELF CARE  Per UR Regulation:    Comments:  05-27-11 [address] Tomi Bamberger, RN,BSN [phone]  No other needs addressed by CM at this time.

## 2014-03-01 ENCOUNTER — Encounter (HOSPITAL_COMMUNITY): Payer: Self-pay | Admitting: Cardiology

## 2014-09-28 ENCOUNTER — Other Ambulatory Visit: Payer: Self-pay | Admitting: Otolaryngology

## 2014-09-28 DIAGNOSIS — H9313 Tinnitus, bilateral: Secondary | ICD-10-CM

## 2014-09-28 DIAGNOSIS — H903 Sensorineural hearing loss, bilateral: Secondary | ICD-10-CM

## 2014-10-12 ENCOUNTER — Ambulatory Visit
Admission: RE | Admit: 2014-10-12 | Discharge: 2014-10-12 | Disposition: A | Discharge status: Home / Self Care | Payer: Medicare Other | Source: Ambulatory Visit | Attending: Otolaryngology | Admitting: Otolaryngology

## 2014-10-12 DIAGNOSIS — H9313 Tinnitus, bilateral: Secondary | ICD-10-CM

## 2014-10-12 DIAGNOSIS — H903 Sensorineural hearing loss, bilateral: Secondary | ICD-10-CM

## 2014-10-12 MED ORDER — GADOBENATE DIMEGLUMINE 529 MG/ML IV SOLN
10.0000 mL | Freq: Once | INTRAVENOUS | Status: AC | PRN
  Administered 2014-10-12: 10 mL via INTRAVENOUS

## 2015-04-01 DIAGNOSIS — H2512 Age-related nuclear cataract, left eye: Secondary | ICD-10-CM | POA: Diagnosis not present

## 2015-04-01 DIAGNOSIS — H25812 Combined forms of age-related cataract, left eye: Secondary | ICD-10-CM | POA: Diagnosis not present

## 2015-08-13 DIAGNOSIS — I2510 Atherosclerotic heart disease of native coronary artery without angina pectoris: Secondary | ICD-10-CM | POA: Diagnosis not present

## 2015-08-13 DIAGNOSIS — I10 Essential (primary) hypertension: Secondary | ICD-10-CM | POA: Diagnosis not present

## 2015-08-13 DIAGNOSIS — E785 Hyperlipidemia, unspecified: Secondary | ICD-10-CM | POA: Diagnosis not present

## 2015-08-27 DIAGNOSIS — I10 Essential (primary) hypertension: Secondary | ICD-10-CM | POA: Diagnosis not present

## 2015-08-27 DIAGNOSIS — F0390 Unspecified dementia without behavioral disturbance: Secondary | ICD-10-CM | POA: Diagnosis not present

## 2015-08-27 DIAGNOSIS — E7800 Pure hypercholesterolemia, unspecified: Secondary | ICD-10-CM | POA: Diagnosis not present

## 2015-10-17 DIAGNOSIS — F0390 Unspecified dementia without behavioral disturbance: Secondary | ICD-10-CM | POA: Diagnosis not present

## 2015-10-17 DIAGNOSIS — E7800 Pure hypercholesterolemia, unspecified: Secondary | ICD-10-CM | POA: Diagnosis not present

## 2015-10-17 DIAGNOSIS — I10 Essential (primary) hypertension: Secondary | ICD-10-CM | POA: Diagnosis not present

## 2015-11-14 DIAGNOSIS — I10 Essential (primary) hypertension: Secondary | ICD-10-CM | POA: Diagnosis not present

## 2015-11-14 DIAGNOSIS — E7800 Pure hypercholesterolemia, unspecified: Secondary | ICD-10-CM | POA: Diagnosis not present

## 2015-11-14 DIAGNOSIS — F0390 Unspecified dementia without behavioral disturbance: Secondary | ICD-10-CM | POA: Diagnosis not present

## 2015-12-17 DIAGNOSIS — Z299 Encounter for prophylactic measures, unspecified: Secondary | ICD-10-CM | POA: Diagnosis not present

## 2015-12-17 DIAGNOSIS — Z0000 Encounter for general adult medical examination without abnormal findings: Secondary | ICD-10-CM | POA: Diagnosis not present

## 2015-12-17 DIAGNOSIS — E7800 Pure hypercholesterolemia, unspecified: Secondary | ICD-10-CM | POA: Diagnosis not present

## 2015-12-17 DIAGNOSIS — Z1389 Encounter for screening for other disorder: Secondary | ICD-10-CM | POA: Diagnosis not present

## 2015-12-17 DIAGNOSIS — Z1211 Encounter for screening for malignant neoplasm of colon: Secondary | ICD-10-CM | POA: Diagnosis not present

## 2015-12-17 DIAGNOSIS — Z79899 Other long term (current) drug therapy: Secondary | ICD-10-CM | POA: Diagnosis not present

## 2015-12-17 DIAGNOSIS — R5383 Other fatigue: Secondary | ICD-10-CM | POA: Diagnosis not present

## 2015-12-17 DIAGNOSIS — Z7189 Other specified counseling: Secondary | ICD-10-CM | POA: Diagnosis not present

## 2016-01-13 DIAGNOSIS — Z23 Encounter for immunization: Secondary | ICD-10-CM | POA: Diagnosis not present

## 2016-01-20 DIAGNOSIS — I10 Essential (primary) hypertension: Secondary | ICD-10-CM | POA: Diagnosis not present

## 2016-01-20 DIAGNOSIS — E7800 Pure hypercholesterolemia, unspecified: Secondary | ICD-10-CM | POA: Diagnosis not present

## 2016-01-20 DIAGNOSIS — F0390 Unspecified dementia without behavioral disturbance: Secondary | ICD-10-CM | POA: Diagnosis not present

## 2016-02-19 DIAGNOSIS — R195 Other fecal abnormalities: Secondary | ICD-10-CM | POA: Diagnosis not present

## 2016-02-20 DIAGNOSIS — F0390 Unspecified dementia without behavioral disturbance: Secondary | ICD-10-CM | POA: Diagnosis not present

## 2016-02-20 DIAGNOSIS — E7800 Pure hypercholesterolemia, unspecified: Secondary | ICD-10-CM | POA: Diagnosis not present

## 2016-02-20 DIAGNOSIS — I10 Essential (primary) hypertension: Secondary | ICD-10-CM | POA: Diagnosis not present

## 2016-02-21 DIAGNOSIS — F419 Anxiety disorder, unspecified: Secondary | ICD-10-CM | POA: Diagnosis not present

## 2016-02-21 DIAGNOSIS — J069 Acute upper respiratory infection, unspecified: Secondary | ICD-10-CM | POA: Diagnosis not present

## 2016-02-21 DIAGNOSIS — R21 Rash and other nonspecific skin eruption: Secondary | ICD-10-CM | POA: Diagnosis not present

## 2016-02-21 DIAGNOSIS — I10 Essential (primary) hypertension: Secondary | ICD-10-CM | POA: Diagnosis not present

## 2016-02-21 DIAGNOSIS — Z299 Encounter for prophylactic measures, unspecified: Secondary | ICD-10-CM | POA: Diagnosis not present

## 2016-03-09 DIAGNOSIS — F0390 Unspecified dementia without behavioral disturbance: Secondary | ICD-10-CM | POA: Diagnosis not present

## 2016-03-09 DIAGNOSIS — E7800 Pure hypercholesterolemia, unspecified: Secondary | ICD-10-CM | POA: Diagnosis not present

## 2016-03-09 DIAGNOSIS — I10 Essential (primary) hypertension: Secondary | ICD-10-CM | POA: Diagnosis not present

## 2016-03-12 DIAGNOSIS — Z79899 Other long term (current) drug therapy: Secondary | ICD-10-CM | POA: Diagnosis not present

## 2016-03-12 DIAGNOSIS — F0390 Unspecified dementia without behavioral disturbance: Secondary | ICD-10-CM | POA: Diagnosis not present

## 2016-03-12 DIAGNOSIS — K6289 Other specified diseases of anus and rectum: Secondary | ICD-10-CM | POA: Diagnosis not present

## 2016-03-12 DIAGNOSIS — Q438 Other specified congenital malformations of intestine: Secondary | ICD-10-CM | POA: Diagnosis not present

## 2016-03-12 DIAGNOSIS — Z951 Presence of aortocoronary bypass graft: Secondary | ICD-10-CM | POA: Diagnosis not present

## 2016-03-12 DIAGNOSIS — Z800 Family history of malignant neoplasm of digestive organs: Secondary | ICD-10-CM | POA: Diagnosis not present

## 2016-03-12 DIAGNOSIS — Z90710 Acquired absence of both cervix and uterus: Secondary | ICD-10-CM | POA: Diagnosis not present

## 2016-03-12 DIAGNOSIS — R195 Other fecal abnormalities: Secondary | ICD-10-CM | POA: Diagnosis not present

## 2016-03-12 DIAGNOSIS — I10 Essential (primary) hypertension: Secondary | ICD-10-CM | POA: Diagnosis not present

## 2016-03-12 DIAGNOSIS — Z886 Allergy status to analgesic agent status: Secondary | ICD-10-CM | POA: Diagnosis not present

## 2016-03-12 DIAGNOSIS — Z7982 Long term (current) use of aspirin: Secondary | ICD-10-CM | POA: Diagnosis not present

## 2016-03-12 DIAGNOSIS — Z9049 Acquired absence of other specified parts of digestive tract: Secondary | ICD-10-CM | POA: Diagnosis not present

## 2016-03-12 DIAGNOSIS — K219 Gastro-esophageal reflux disease without esophagitis: Secondary | ICD-10-CM | POA: Diagnosis not present

## 2016-03-12 DIAGNOSIS — Z8249 Family history of ischemic heart disease and other diseases of the circulatory system: Secondary | ICD-10-CM | POA: Diagnosis not present

## 2016-05-05 DIAGNOSIS — E7800 Pure hypercholesterolemia, unspecified: Secondary | ICD-10-CM | POA: Diagnosis not present

## 2016-05-05 DIAGNOSIS — F0390 Unspecified dementia without behavioral disturbance: Secondary | ICD-10-CM | POA: Diagnosis not present

## 2016-05-05 DIAGNOSIS — I10 Essential (primary) hypertension: Secondary | ICD-10-CM | POA: Diagnosis not present

## 2016-05-11 DIAGNOSIS — Z789 Other specified health status: Secondary | ICD-10-CM | POA: Diagnosis not present

## 2016-05-11 DIAGNOSIS — R1013 Epigastric pain: Secondary | ICD-10-CM | POA: Diagnosis not present

## 2016-05-11 DIAGNOSIS — E876 Hypokalemia: Secondary | ICD-10-CM | POA: Diagnosis not present

## 2016-05-11 DIAGNOSIS — E781 Pure hyperglyceridemia: Secondary | ICD-10-CM | POA: Diagnosis not present

## 2016-05-11 DIAGNOSIS — Z713 Dietary counseling and surveillance: Secondary | ICD-10-CM | POA: Diagnosis not present

## 2016-05-11 DIAGNOSIS — I10 Essential (primary) hypertension: Secondary | ICD-10-CM | POA: Diagnosis not present

## 2016-05-11 DIAGNOSIS — Z6820 Body mass index (BMI) 20.0-20.9, adult: Secondary | ICD-10-CM | POA: Diagnosis not present

## 2016-05-11 DIAGNOSIS — Z299 Encounter for prophylactic measures, unspecified: Secondary | ICD-10-CM | POA: Diagnosis not present

## 2016-05-13 DIAGNOSIS — Z961 Presence of intraocular lens: Secondary | ICD-10-CM | POA: Diagnosis not present

## 2016-05-13 DIAGNOSIS — H04123 Dry eye syndrome of bilateral lacrimal glands: Secondary | ICD-10-CM | POA: Diagnosis not present

## 2016-05-13 DIAGNOSIS — H25811 Combined forms of age-related cataract, right eye: Secondary | ICD-10-CM | POA: Diagnosis not present

## 2016-05-13 DIAGNOSIS — H26492 Other secondary cataract, left eye: Secondary | ICD-10-CM | POA: Diagnosis not present

## 2016-05-27 DIAGNOSIS — Z961 Presence of intraocular lens: Secondary | ICD-10-CM | POA: Diagnosis not present

## 2016-05-27 DIAGNOSIS — H26492 Other secondary cataract, left eye: Secondary | ICD-10-CM | POA: Diagnosis not present

## 2016-06-09 DIAGNOSIS — F0390 Unspecified dementia without behavioral disturbance: Secondary | ICD-10-CM | POA: Diagnosis not present

## 2016-06-09 DIAGNOSIS — I10 Essential (primary) hypertension: Secondary | ICD-10-CM | POA: Diagnosis not present

## 2016-06-09 DIAGNOSIS — E7800 Pure hypercholesterolemia, unspecified: Secondary | ICD-10-CM | POA: Diagnosis not present

## 2016-07-10 DIAGNOSIS — I10 Essential (primary) hypertension: Secondary | ICD-10-CM | POA: Diagnosis not present

## 2016-07-10 DIAGNOSIS — E7800 Pure hypercholesterolemia, unspecified: Secondary | ICD-10-CM | POA: Diagnosis not present

## 2016-07-10 DIAGNOSIS — F0390 Unspecified dementia without behavioral disturbance: Secondary | ICD-10-CM | POA: Diagnosis not present

## 2016-08-11 DIAGNOSIS — I10 Essential (primary) hypertension: Secondary | ICD-10-CM | POA: Diagnosis not present

## 2016-08-11 DIAGNOSIS — E785 Hyperlipidemia, unspecified: Secondary | ICD-10-CM | POA: Diagnosis not present

## 2016-08-11 DIAGNOSIS — F0390 Unspecified dementia without behavioral disturbance: Secondary | ICD-10-CM | POA: Diagnosis not present

## 2016-08-11 DIAGNOSIS — I2510 Atherosclerotic heart disease of native coronary artery without angina pectoris: Secondary | ICD-10-CM | POA: Diagnosis not present

## 2016-08-11 DIAGNOSIS — E7800 Pure hypercholesterolemia, unspecified: Secondary | ICD-10-CM | POA: Diagnosis not present

## 2016-09-24 DIAGNOSIS — I10 Essential (primary) hypertension: Secondary | ICD-10-CM | POA: Diagnosis not present

## 2016-09-24 DIAGNOSIS — E7800 Pure hypercholesterolemia, unspecified: Secondary | ICD-10-CM | POA: Diagnosis not present

## 2016-09-24 DIAGNOSIS — F0390 Unspecified dementia without behavioral disturbance: Secondary | ICD-10-CM | POA: Diagnosis not present

## 2016-10-26 DIAGNOSIS — F0390 Unspecified dementia without behavioral disturbance: Secondary | ICD-10-CM | POA: Diagnosis not present

## 2016-10-26 DIAGNOSIS — E7800 Pure hypercholesterolemia, unspecified: Secondary | ICD-10-CM | POA: Diagnosis not present

## 2016-10-26 DIAGNOSIS — I10 Essential (primary) hypertension: Secondary | ICD-10-CM | POA: Diagnosis not present

## 2016-12-02 DIAGNOSIS — R5383 Other fatigue: Secondary | ICD-10-CM | POA: Diagnosis not present

## 2016-12-02 DIAGNOSIS — Z6821 Body mass index (BMI) 21.0-21.9, adult: Secondary | ICD-10-CM | POA: Diagnosis not present

## 2016-12-02 DIAGNOSIS — R1013 Epigastric pain: Secondary | ICD-10-CM | POA: Diagnosis not present

## 2016-12-02 DIAGNOSIS — E781 Pure hyperglyceridemia: Secondary | ICD-10-CM | POA: Diagnosis not present

## 2016-12-02 DIAGNOSIS — K5909 Other constipation: Secondary | ICD-10-CM | POA: Diagnosis not present

## 2016-12-02 DIAGNOSIS — F419 Anxiety disorder, unspecified: Secondary | ICD-10-CM | POA: Diagnosis not present

## 2016-12-02 DIAGNOSIS — F0390 Unspecified dementia without behavioral disturbance: Secondary | ICD-10-CM | POA: Diagnosis not present

## 2016-12-02 DIAGNOSIS — G629 Polyneuropathy, unspecified: Secondary | ICD-10-CM | POA: Diagnosis not present

## 2016-12-02 DIAGNOSIS — I10 Essential (primary) hypertension: Secondary | ICD-10-CM | POA: Diagnosis not present

## 2016-12-02 DIAGNOSIS — Z299 Encounter for prophylactic measures, unspecified: Secondary | ICD-10-CM | POA: Diagnosis not present

## 2016-12-07 ENCOUNTER — Encounter (INDEPENDENT_AMBULATORY_CARE_PROVIDER_SITE_OTHER): Payer: Self-pay | Admitting: Internal Medicine

## 2016-12-07 ENCOUNTER — Encounter (INDEPENDENT_AMBULATORY_CARE_PROVIDER_SITE_OTHER): Payer: Self-pay

## 2016-12-10 DIAGNOSIS — T192XXA Foreign body in vulva and vagina, initial encounter: Secondary | ICD-10-CM | POA: Diagnosis not present

## 2016-12-10 DIAGNOSIS — Z6821 Body mass index (BMI) 21.0-21.9, adult: Secondary | ICD-10-CM | POA: Diagnosis not present

## 2016-12-21 DIAGNOSIS — F0390 Unspecified dementia without behavioral disturbance: Secondary | ICD-10-CM | POA: Diagnosis not present

## 2016-12-21 DIAGNOSIS — E7800 Pure hypercholesterolemia, unspecified: Secondary | ICD-10-CM | POA: Diagnosis not present

## 2016-12-21 DIAGNOSIS — I10 Essential (primary) hypertension: Secondary | ICD-10-CM | POA: Diagnosis not present

## 2016-12-24 ENCOUNTER — Encounter (INDEPENDENT_AMBULATORY_CARE_PROVIDER_SITE_OTHER): Payer: Self-pay | Admitting: *Deleted

## 2016-12-24 ENCOUNTER — Encounter (INDEPENDENT_AMBULATORY_CARE_PROVIDER_SITE_OTHER): Payer: Self-pay | Admitting: Internal Medicine

## 2016-12-24 ENCOUNTER — Other Ambulatory Visit (INDEPENDENT_AMBULATORY_CARE_PROVIDER_SITE_OTHER): Payer: Self-pay | Admitting: Internal Medicine

## 2016-12-24 ENCOUNTER — Ambulatory Visit (INDEPENDENT_AMBULATORY_CARE_PROVIDER_SITE_OTHER): Payer: Medicare Other | Admitting: Internal Medicine

## 2016-12-24 VITALS — BP 160/60 | HR 56 | Temp 98.2°F | Wt 124.5 lb

## 2016-12-24 DIAGNOSIS — R1013 Epigastric pain: Secondary | ICD-10-CM | POA: Diagnosis not present

## 2016-12-24 DIAGNOSIS — R634 Abnormal weight loss: Secondary | ICD-10-CM

## 2016-12-24 DIAGNOSIS — R1010 Upper abdominal pain, unspecified: Secondary | ICD-10-CM

## 2016-12-24 LAB — CREATININE, SERUM: CREATININE: 1.13 mg/dL — AB (ref 0.60–0.88)

## 2016-12-24 NOTE — Progress Notes (Addendum)
Subjective:    Patient ID: Gloria Rowland, female    DOB: Mar 19, 1933, 83 y.o.   MRN: [id number]  HPI Referred by Dr. Sherril Croon for weight loss/abdominal pain/EGD. She has epigastric pain. She states she is in a lot of pain.  Her mother and mother's sister had stomach cancer. She has had the pain for 6 months.  The pain worsens after she eats. She burps frequently. She feels a lot of pressure.  Her normal weight is 124.5. She says she had lost weight but has gained her weight back about 2 months ago.  No nausea, vomiting or diarrhea.  She is drinking Ensure.  Usually has a BM every day.   Significant hx for heart disease.  12/02/2016 H and H 10.7 and 31.5, albumin 4.1, total bili 0.4,. ALP 62, AST 20, ALT 7 Her last colonoscopy was in 2010 by Dr. Karilyn Cota (average risk): normal.   Small external hemorrhoids. Very redundant colon with diffuse changes of melanosis coli.   Family sister of colon cancer in a sister that died at age 32. Diagnosed at age 13.Review of Systems Past Medical History:  Diagnosis Date  . Baker's cyst, ruptured   . CAD (coronary artery disease)   . Dementia   . History of hepatitis   . Hyperlipidemia   . Hypertension   . Lumbar degenerative disc disease   . Nephrolithiasis   . Spasmodic torticollis     Past Surgical History:  Procedure Laterality Date  . ABDOMINAL HYSTERECTOMY    . CARPAL TUNNEL RELEASE    . CHOLECYSTECTOMY    . CORONARY ARTERY BYPASS GRAFT  1998   LIMA to LAD-Dx  . KIDNEY STONE SURGERY    . LEFT HEART CATHETERIZATION WITH CORONARY/GRAFT ANGIOGRAM N/A 05/26/2011   Procedure: LEFT HEART CATHETERIZATION WITH Isabel Caprice;  Surgeon: Othella Boyer, MD;  Location: [facility] CATH LAB;  Service: Cardiovascular;  Laterality: N/A;  . LUMBAR LAMINECTOMY      Allergies  Allergen Reactions  . Aspirin     Upset stomach  . Codeine     Upset stomach    Current Outpatient Prescriptions on File Prior to Visit  Medication Sig Dispense Refill  .  aspirin 81 MG chewable tablet Chew 81 mg by mouth daily.    . citalopram (CELEXA) 10 MG tablet Take 10 mg by mouth daily.    Marland Kitchen gemfibrozil (LOPID) 600 MG tablet Take 600 mg by mouth 2 (two) times daily before a meal.    . lisinopril (PRINIVIL,ZESTRIL) 20 MG tablet Take 20 mg by mouth daily.    . meclizine (ANTIVERT) 12.5 MG tablet Take 12.5 mg by mouth daily as needed. For headache,dizziness    . amLODipine (NORVASC) 5 MG tablet Take 1 tablet (5 mg total) by mouth daily. 30 tablet 12  . pantoprazole (PROTONIX) 40 MG tablet Take 1 tablet (40 mg total) by mouth 2 (two) times daily. 60 tablet 12   No current facility-administered medications on file prior to visit.         Objective:   Physical Exam Blood pressure (!) 160/60, pulse (!) 56, temperature 98.2 F (36.8 C), weight 124 lb 8 oz (56.5 kg). Alert and oriented. Skin warm and dry. Oral mucosa is moist.   . Sclera anicteric, conjunctivae is pink. Thyroid not enlarged. No cervical lymphadenopathy. Lungs clear. Heart regular rate and rhythm.  Abdomen is soft. Bowel sounds are positive. No hepatomegaly. No abdominal masses felt. No tenderness.  No edema to lower extremities.  Assessment & Plan:  Weight loss. Epigastric pain. Family hx of stomach cancer. Ct abdomen/pelvis with Cm.    PUD needs to be ruled out.  EGD. The risks of bleeding, perforation and infection were reviewed with patient.

## 2016-12-24 NOTE — Patient Instructions (Addendum)
The risks of bleeding, perforation and infection were reviewed with patient. Continue the Protonix. 

## 2016-12-24 NOTE — Addendum Note (Signed)
Addended by: Len Blalock on: 12/24/2016 10:15 AM   Modules accepted: Orders

## 2016-12-30 DIAGNOSIS — E7800 Pure hypercholesterolemia, unspecified: Secondary | ICD-10-CM | POA: Diagnosis not present

## 2016-12-30 DIAGNOSIS — Z299 Encounter for prophylactic measures, unspecified: Secondary | ICD-10-CM | POA: Diagnosis not present

## 2016-12-30 DIAGNOSIS — R5383 Other fatigue: Secondary | ICD-10-CM | POA: Diagnosis not present

## 2016-12-30 DIAGNOSIS — Z23 Encounter for immunization: Secondary | ICD-10-CM | POA: Diagnosis not present

## 2016-12-30 DIAGNOSIS — I10 Essential (primary) hypertension: Secondary | ICD-10-CM | POA: Diagnosis not present

## 2016-12-30 DIAGNOSIS — Z79899 Other long term (current) drug therapy: Secondary | ICD-10-CM | POA: Diagnosis not present

## 2016-12-30 DIAGNOSIS — Z1331 Encounter for screening for depression: Secondary | ICD-10-CM | POA: Diagnosis not present

## 2016-12-30 DIAGNOSIS — F419 Anxiety disorder, unspecified: Secondary | ICD-10-CM | POA: Diagnosis not present

## 2016-12-30 DIAGNOSIS — Z0000 Encounter for general adult medical examination without abnormal findings: Secondary | ICD-10-CM | POA: Diagnosis not present

## 2016-12-30 DIAGNOSIS — Z7189 Other specified counseling: Secondary | ICD-10-CM | POA: Diagnosis not present

## 2016-12-30 DIAGNOSIS — Z1339 Encounter for screening examination for other mental health and behavioral disorders: Secondary | ICD-10-CM | POA: Diagnosis not present

## 2016-12-30 DIAGNOSIS — Z6821 Body mass index (BMI) 21.0-21.9, adult: Secondary | ICD-10-CM | POA: Diagnosis not present

## 2017-01-04 ENCOUNTER — Ambulatory Visit (HOSPITAL_COMMUNITY)
Admission: RE | Admit: 2017-01-04 | Discharge: 2017-01-04 | Disposition: A | Discharge status: Home / Self Care | Payer: Medicare Other | Source: Ambulatory Visit | Attending: Internal Medicine | Admitting: Internal Medicine

## 2017-01-04 DIAGNOSIS — Z9049 Acquired absence of other specified parts of digestive tract: Secondary | ICD-10-CM | POA: Insufficient documentation

## 2017-01-04 DIAGNOSIS — M419 Scoliosis, unspecified: Secondary | ICD-10-CM | POA: Insufficient documentation

## 2017-01-04 DIAGNOSIS — Z90710 Acquired absence of both cervix and uterus: Secondary | ICD-10-CM | POA: Diagnosis not present

## 2017-01-04 DIAGNOSIS — M47816 Spondylosis without myelopathy or radiculopathy, lumbar region: Secondary | ICD-10-CM | POA: Insufficient documentation

## 2017-01-04 DIAGNOSIS — M48061 Spinal stenosis, lumbar region without neurogenic claudication: Secondary | ICD-10-CM | POA: Insufficient documentation

## 2017-01-04 DIAGNOSIS — R1013 Epigastric pain: Secondary | ICD-10-CM | POA: Diagnosis not present

## 2017-01-04 DIAGNOSIS — R634 Abnormal weight loss: Secondary | ICD-10-CM | POA: Diagnosis not present

## 2017-01-04 DIAGNOSIS — N1330 Unspecified hydronephrosis: Secondary | ICD-10-CM | POA: Insufficient documentation

## 2017-01-04 DIAGNOSIS — R935 Abnormal findings on diagnostic imaging of other abdominal regions, including retroperitoneum: Secondary | ICD-10-CM | POA: Insufficient documentation

## 2017-01-04 DIAGNOSIS — R1010 Upper abdominal pain, unspecified: Secondary | ICD-10-CM | POA: Diagnosis not present

## 2017-01-04 DIAGNOSIS — I700 Atherosclerosis of aorta: Secondary | ICD-10-CM | POA: Insufficient documentation

## 2017-01-04 DIAGNOSIS — R109 Unspecified abdominal pain: Secondary | ICD-10-CM | POA: Diagnosis not present

## 2017-01-04 MED ORDER — IOPAMIDOL (ISOVUE-300) INJECTION 61%
75.0000 mL | Freq: Once | INTRAVENOUS | Status: AC | PRN
  Administered 2017-01-04: 75 mL via INTRAVENOUS

## 2017-01-07 ENCOUNTER — Telehealth (INDEPENDENT_AMBULATORY_CARE_PROVIDER_SITE_OTHER): Payer: Self-pay | Admitting: Internal Medicine

## 2017-01-07 NOTE — Telephone Encounter (Signed)
Patient's daughter, Gloria Rowland called for CT results.  Work # - [phone]

## 2017-01-07 NOTE — Telephone Encounter (Signed)
Resutts given to daughter.

## 2017-01-13 DIAGNOSIS — Z6821 Body mass index (BMI) 21.0-21.9, adult: Secondary | ICD-10-CM | POA: Diagnosis not present

## 2017-01-13 DIAGNOSIS — T192XXA Foreign body in vulva and vagina, initial encounter: Secondary | ICD-10-CM | POA: Diagnosis not present

## 2017-01-15 ENCOUNTER — Encounter (HOSPITAL_COMMUNITY): Payer: Self-pay | Admitting: *Deleted

## 2017-01-15 ENCOUNTER — Encounter (HOSPITAL_COMMUNITY)
Admission: RE | Disposition: A | Discharge status: Home / Self Care | Payer: Self-pay | Source: Ambulatory Visit | Attending: Internal Medicine

## 2017-01-15 ENCOUNTER — Ambulatory Visit (HOSPITAL_COMMUNITY)
Admission: RE | Admit: 2017-01-15 | Discharge: 2017-01-15 | Disposition: A | Discharge status: Home / Self Care | Payer: Medicare Other | Source: Ambulatory Visit | Attending: Internal Medicine | Admitting: Internal Medicine

## 2017-01-15 DIAGNOSIS — F0390 Unspecified dementia without behavioral disturbance: Secondary | ICD-10-CM | POA: Insufficient documentation

## 2017-01-15 DIAGNOSIS — Z951 Presence of aortocoronary bypass graft: Secondary | ICD-10-CM | POA: Diagnosis not present

## 2017-01-15 DIAGNOSIS — D649 Anemia, unspecified: Secondary | ICD-10-CM

## 2017-01-15 DIAGNOSIS — R630 Anorexia: Secondary | ICD-10-CM | POA: Insufficient documentation

## 2017-01-15 DIAGNOSIS — E785 Hyperlipidemia, unspecified: Secondary | ICD-10-CM | POA: Insufficient documentation

## 2017-01-15 DIAGNOSIS — N1330 Unspecified hydronephrosis: Secondary | ICD-10-CM

## 2017-01-15 DIAGNOSIS — R1013 Epigastric pain: Secondary | ICD-10-CM | POA: Insufficient documentation

## 2017-01-15 DIAGNOSIS — R933 Abnormal findings on diagnostic imaging of other parts of digestive tract: Secondary | ICD-10-CM | POA: Insufficient documentation

## 2017-01-15 DIAGNOSIS — I10 Essential (primary) hypertension: Secondary | ICD-10-CM | POA: Diagnosis not present

## 2017-01-15 DIAGNOSIS — Z79899 Other long term (current) drug therapy: Secondary | ICD-10-CM | POA: Insufficient documentation

## 2017-01-15 DIAGNOSIS — R634 Abnormal weight loss: Secondary | ICD-10-CM | POA: Insufficient documentation

## 2017-01-15 DIAGNOSIS — Z6822 Body mass index (BMI) 22.0-22.9, adult: Secondary | ICD-10-CM | POA: Insufficient documentation

## 2017-01-15 DIAGNOSIS — K449 Diaphragmatic hernia without obstruction or gangrene: Secondary | ICD-10-CM | POA: Insufficient documentation

## 2017-01-15 DIAGNOSIS — Z7982 Long term (current) use of aspirin: Secondary | ICD-10-CM | POA: Diagnosis not present

## 2017-01-15 DIAGNOSIS — K228 Other specified diseases of esophagus: Secondary | ICD-10-CM | POA: Diagnosis not present

## 2017-01-15 DIAGNOSIS — I2510 Atherosclerotic heart disease of native coronary artery without angina pectoris: Secondary | ICD-10-CM | POA: Diagnosis not present

## 2017-01-15 HISTORY — PX: ESOPHAGOGASTRODUODENOSCOPY: SHX5428

## 2017-01-15 LAB — CBC
HEMATOCRIT: 28.2 % — AB (ref 36.0–46.0)
Hemoglobin: 9.7 g/dL — ABNORMAL LOW (ref 12.0–15.0)
MCH: 31.3 pg (ref 26.0–34.0)
MCHC: 34.4 g/dL (ref 30.0–36.0)
MCV: 91 fL (ref 78.0–100.0)
Platelets: 186 10*3/uL (ref 150–400)
RBC: 3.1 MIL/uL — ABNORMAL LOW (ref 3.87–5.11)
RDW: 13.3 % (ref 11.5–15.5)
WBC: 4 10*3/uL (ref 4.0–10.5)

## 2017-01-15 LAB — CREATININE, SERUM
Creatinine, Ser: 0.98 mg/dL (ref 0.44–1.00)
GFR calc Af Amer: >60 mL/min (ref >60)
GFR, EST NON AFRICAN AMERICAN: 52 mL/min — AB (ref >60)

## 2017-01-15 LAB — HEPATIC FUNCTION PANEL
ALT: 12 U/L — ABNORMAL LOW (ref 14–54)
AST: 15 U/L (ref 15–41)
Albumin: 3.5 g/dL (ref 3.5–5.0)
Alkaline Phosphatase: 48 U/L (ref 38–126)
BILIRUBIN DIRECT: 0.1 mg/dL (ref 0.1–0.5)
BILIRUBIN TOTAL: 0.6 mg/dL (ref 0.3–1.2)
Indirect Bilirubin: 0.5 mg/dL (ref 0.3–0.9)
Total Protein: 5.9 g/dL — ABNORMAL LOW (ref 6.5–8.1)

## 2017-01-15 SURGERY — EGD (ESOPHAGOGASTRODUODENOSCOPY)
Anesthesia: Moderate Sedation

## 2017-01-15 MED ORDER — LIDOCAINE VISCOUS 2 % MT SOLN
OROMUCOSAL | Status: DC | PRN
  Administered 2017-01-15: 4 mL via OROMUCOSAL

## 2017-01-15 MED ORDER — MIDAZOLAM HCL 5 MG/5ML IJ SOLN
INTRAMUSCULAR | Status: AC
  Filled 2017-01-15: qty 10

## 2017-01-15 MED ORDER — MIDAZOLAM HCL 5 MG/5ML IJ SOLN
INTRAMUSCULAR | Status: DC | PRN
  Administered 2017-01-15 (×3): 1 mg via INTRAVENOUS

## 2017-01-15 MED ORDER — LIDOCAINE VISCOUS 2 % MT SOLN
OROMUCOSAL | Status: AC
  Filled 2017-01-15: qty 15

## 2017-01-15 MED ORDER — MEPERIDINE HCL 50 MG/ML IJ SOLN
INTRAMUSCULAR | Status: AC
  Filled 2017-01-15: qty 1

## 2017-01-15 MED ORDER — MEPERIDINE HCL 50 MG/ML IJ SOLN
INTRAMUSCULAR | Status: DC | PRN
  Administered 2017-01-15 (×2): 25 mg via INTRAVENOUS

## 2017-01-15 MED ORDER — SODIUM CHLORIDE 0.9 % IV SOLN
INTRAVENOUS | Status: DC
  Administered 2017-01-15: 1000 mL via INTRAVENOUS

## 2017-01-15 MED ORDER — STERILE WATER FOR IRRIGATION IR SOLN
Status: DC | PRN
  Administered 2017-01-15: 11:00:00

## 2017-01-15 NOTE — Discharge Instructions (Signed)
Resume usual medications and diet. No driving for 24 hours. Physician will call with results of CBC LFTs and serum creatinine.   Upper Endoscopy, Care After Refer to this sheet in the next few weeks. These instructions provide you with information about caring for yourself after your procedure. Your health care provider may also give you more specific instructions. Your treatment has been planned according to current medical practices, but problems sometimes occur. Call your health care provider if you have any problems or questions after your procedure. What can I expect after the procedure? After the procedure, it is common to have:  A sore throat.  Bloating.  Nausea.  Follow these instructions at home:  Follow instructions from your health care provider about what to eat or drink after your procedure.  Return to your normal activities as told by your health care provider. Ask your health care provider what activities are safe for you.  Take over-the-counter and prescription medicines only as told by your health care provider.  Do not drive for 24 hours if you received a sedative.  Keep all follow-up visits as told by your health care provider. This is important. Contact a health care provider if:  You have a sore throat that lasts longer than one day.  You have trouble swallowing. Get help right away if:  You have a fever.  You vomit blood or your vomit looks like coffee grounds.  You have bloody, black, or tarry stools.  You have a severe sore throat or you cannot swallow.  You have difficulty breathing.  You have severe pain in your chest or belly. This information is not intended to replace advice given to you by your health care provider. Make sure you discuss any questions you have with your health care provider. Document Released: 09/08/2011 Document Revised: 08/15/2015 Document Reviewed: 12/20/2014 Elsevier Interactive Patient Education  2017 [employer]

## 2017-01-15 NOTE — H&P (Signed)
Gloria Rowland is an 83 y.o. female.   Chief Complaint: Patient is here for EGD. HPI: Patient is 83 year old Caucasian female with multiple medical problems who presents with several month history of epigastric/upper abdominal pain associated with anorexia and some weight loss. She denies dysphagia melena or rectal bleeding. Recent lab studies pertinent for anemia with hemoglobin low 10 range. LFTs are normal. Abdominopelvic CT from 2 weeks ago revealed soft tissue density at GE junction, dilated bile duct measuring 15 mm and left hydronephrosis. She is undergoing diagnostic EGD. She is on low-dose aspirin but does not take other OTC NSAIDs.  Past Medical History:  Diagnosis Rowland  . Baker's cyst, ruptured   . CAD (coronary artery disease)   . Dementia   . History of hepatitis   . Hyperlipidemia   . Hypertension   . Lumbar degenerative disc disease   . Nephrolithiasis   . Spasmodic torticollis     Past Surgical History:  Procedure Laterality Rowland  . ABDOMINAL HYSTERECTOMY    . CARPAL TUNNEL RELEASE    . CHOLECYSTECTOMY    . CORONARY ARTERY BYPASS GRAFT  1998   LIMA to LAD-Dx  . KIDNEY STONE SURGERY    . LEFT HEART CATHETERIZATION WITH CORONARY/GRAFT ANGIOGRAM N/A 05/26/2011   Procedure: LEFT HEART CATHETERIZATION WITH Isabel Caprice;  Surgeon: Othella Boyer, MD;  Location: [facility] CATH LAB;  Service: Cardiovascular;  Laterality: N/A;  . LUMBAR LAMINECTOMY      Family History  Problem Relation Age of Onset  . Diabetes Sister   . Diabetes Sister    Social History:  reports that she has never smoked. She has never used smokeless tobacco. She reports that she does not drink alcohol or use drugs.  Allergies:  Allergies  Allergen Reactions  . Aspirin     Upset stomach  . Codeine     Upset stomach    Medications Prior to Admission  Medication Sig Dispense Refill  . amLODipine (NORVASC) 5 MG tablet Take 5 mg by mouth daily.    Marland Kitchen aspirin EC 81 MG tablet Take 81 mg by  mouth daily.    . citalopram (CELEXA) 10 MG tablet Take 10 mg by mouth daily.    . Garlic 1000 MG CAPS Take 1,000 mg by mouth 2 (two) times daily.    Marland Kitchen gemfibrozil (LOPID) 600 MG tablet Take 600 mg by mouth daily with breakfast.     . lisinopril (PRINIVIL,ZESTRIL) 20 MG tablet Take 20 mg by mouth 2 (two) times daily.     . memantine (NAMENDA) 10 MG tablet Take 10 mg by mouth 2 (two) times daily.    . Omega-3 Fatty Acids (FISH OIL) 1200 MG CAPS Take 1,200 mg by mouth 2 (two) times daily.    Marland Kitchen oxymetazoline (AFRIN) 0.05 % nasal spray Place 1 spray into both nostrils 2 (two) times daily as needed for congestion.    . pantoprazole (PROTONIX) 40 MG tablet Take 20 mg by mouth 2 (two) times daily.     Bertram Gala Glycol-Propyl Glycol (LUBRICANT EYE DROPS) 0.4-0.3 % SOLN Place 1-2 drops into both eyes 2 (two) times daily.    . polyethylene glycol powder (GLYCOLAX/MIRALAX) powder Take 17 g by mouth daily as needed. For constipation.  3  . rosuvastatin (CRESTOR) 10 MG tablet Take 10 mg by mouth daily.    . vitamin C (ASCORBIC ACID) 500 MG tablet Take 500 mg by mouth daily.    . Meclizine HCl 25 MG CHEW Chew 25 mg  by mouth 3 (three) times daily as needed (for vertigo/dizziness).      No results found for this or any previous visit (from the past 48 hour(s)). No results found.  ROS  Blood pressure (!) 187/76, pulse (!) 57, temperature 97.7 F (36.5 C), resp. rate 16, height 5\' 1"  (1.549 m), weight 121 lb (54.9 kg), SpO2 99 %. Physical Exam  Constitutional:  Well-developed thin Caucasian female in NAD.  HENT:  Mouth/Throat: Oropharynx is clear and moist.  Eyes: Conjunctivae are normal. No scleral icterus.  Neck: No thyromegaly present.  Cardiovascular: Normal rate, regular rhythm and normal heart sounds.   No murmur heard. Respiratory: Effort normal and breath sounds normal.  GI:  Abdomen is flat and symmetrical. It is soft with mild midepigastric tenderness. No organomegaly or masses.   Musculoskeletal: She exhibits no edema.  Lymphadenopathy:    She has no cervical adenopathy.  Neurological: She is alert.  Skin: Skin is warm and dry.     Assessment/Plan Epigastric pain anorexia and weight loss. Soft tissue density at GE junction noted on CT. Diagnostic EGD.  Lionel DecemberNajeeb Zenora Karpel, MD 01/15/2017, 11:04 AM

## 2017-01-15 NOTE — Op Note (Signed)
[facility] Patient Name: Gloria Rowland Procedure Date: 01/15/2017 10:52 AM MRN: [id number] Date of Birth: 01/12/1933 Attending MD: Lionel DecemberNajeeb Niomie Englert , MD CSN: [id number] Age: 83 Admit Type: Outpatient Procedure:                Upper GI endoscopy Indications:              Epigastric abdominal pain, Abnormal CT (soft tissue                            density at GE junction). Providers:                Lionel DecemberNajeeb Sharonna Vinje, MD, Criselda PeachesLurae B. Patsy LagerAlbert RN, RN, Virgie DadAngela                            Collins, Burke Keelsrisann Tilley, Technician Referring MD:             Ignatius Speckinghruv B. Vyas, MD Medicines:                Lidocaine spray, Meperidine 50 mg IV, Midazolam 3                            mg IV Complications:            No immediate complications. Estimated Blood Loss:     Estimated blood loss: none. Procedure:                Pre-Anesthesia Assessment:                           - Prior to the procedure, a History and Physical                            was performed, and patient medications and                            allergies were reviewed. The patient's tolerance of                            previous anesthesia was also reviewed. The risks                            and benefits of the procedure and the sedation                            options and risks were discussed with the patient.                            All questions were answered, and informed consent                            was obtained. Prior Anticoagulants: The patient                            last took aspirin 5 days prior to the procedure.  ASA Grade Assessment: III - A patient with severe                            systemic disease. After reviewing the risks and                            benefits, the patient was deemed in satisfactory                            condition to undergo the procedure.                           After obtaining informed consent, the endoscope was                            passed  under direct vision. Throughout the                            procedure, the patient's blood pressure, pulse, and                            oxygen saturations were monitored continuously. The                            [id number]) scope was introduced through the                            mouth, and advanced to the second part of duodenum.                            The upper GI endoscopy was somewhat difficult due                            to the patient's discomfort during the procedure.                            Successful completion of the procedure was aided by                            withdrawing the scope and replacing with the                            'babyscope'. The patient tolerated the procedure                            well. Scope In: 11:16:51 AM Scope Out: 11:28:12 AM Total Procedure Duration: 0 hours 11 minutes 21 seconds  Findings:      The examined esophagus was normal.      The Z-line was irregular and was found 40 cm from the incisors.      A 2 cm hiatal hernia was present.      The entire examined stomach was normal.      The duodenal bulb and second portion of the duodenum were normal. Impression:               -  Normal esophagus.                           - Z-line irregular, 40 cm from the incisors.                           - 2 cm hiatal hernia.                           - Normal stomach.                           - Normal duodenal bulb and second portion of the                            duodenum.                           - No specimens collected. Moderate Sedation:      Moderate (conscious) sedation was administered by the endoscopy nurse       and supervised by the endoscopist. The following parameters were       monitored: oxygen saturation, heart rate, blood pressure, CO2       capnography and response to care. Total physician intraservice time was       17 minutes. Recommendation:           - Patient has a contact number available for                             emergencies. The signs and symptoms of potential                            delayed complications were discussed with the                            patient. Return to normal activities tomorrow.                            Written discharge instructions were provided to the                            patient.                           - Resume previous diet today.                           - Continue present medications.                           - Resume aspirin at prior dose today.                           - Will check CBC, LFTs and serum creatinine today. Procedure Code(s):        --- Professional ---  81191, Esophagogastroduodenoscopy, flexible,                            transoral; diagnostic, including collection of                            specimen(s) by brushing or washing, when performed                            (separate procedure)                           99152, Moderate sedation services provided by the                            same physician or other qualified health care                            professional performing the diagnostic or                            therapeutic service that the sedation supports,                            requiring the presence of an independent trained                            observer to assist in the monitoring of the                            patient's level of consciousness and physiological                            status; initial 15 minutes of intraservice time,                            patient age 81 years or older Diagnosis Code(s):        --- Professional ---                           K22.8, Other specified diseases of esophagus                           K44.9, Diaphragmatic hernia without obstruction or                            gangrene                           R10.13, Epigastric pain                           R93.3, Abnormal findings on diagnostic imaging of                             other parts of digestive tract CPT copyright  2016 American Medical Association. All rights reserved. The codes documented in this report are preliminary and upon coder review may  be revised to meet current compliance requirements. Lionel December, MD Lionel December, MD 01/15/2017 11:39:42 AM This report has been signed electronically. Number of Addenda: 0

## 2017-01-18 ENCOUNTER — Telehealth (INDEPENDENT_AMBULATORY_CARE_PROVIDER_SITE_OTHER): Payer: Self-pay | Admitting: Internal Medicine

## 2017-01-18 NOTE — Telephone Encounter (Signed)
Gloria Rowland, patient's daughter called, stated that they didn't hear back about the labs for her liver.  She would like a call back letting her know if we have those results.  [phone]

## 2017-01-19 ENCOUNTER — Encounter (HOSPITAL_COMMUNITY): Payer: Self-pay | Admitting: Internal Medicine

## 2017-01-19 DIAGNOSIS — E2839 Other primary ovarian failure: Secondary | ICD-10-CM | POA: Diagnosis not present

## 2017-01-21 DIAGNOSIS — F0390 Unspecified dementia without behavioral disturbance: Secondary | ICD-10-CM | POA: Diagnosis not present

## 2017-01-21 DIAGNOSIS — E7800 Pure hypercholesterolemia, unspecified: Secondary | ICD-10-CM | POA: Diagnosis not present

## 2017-01-21 DIAGNOSIS — I10 Essential (primary) hypertension: Secondary | ICD-10-CM | POA: Diagnosis not present

## 2017-01-22 NOTE — Telephone Encounter (Signed)
Notes recorded by Malissa Hippoehman, Najeeb U, MD on 01/19/2017 at 5:23 PM EDT LFTs are normal. Serum creatinine is now normal. CT images reviewed with Janeece Riggersavid Clark yesterday. Small rounded density possibly in the pancreas and may be incidental. CT findings discussed with Dr. Sherril CroonVyas. No history of hydronephrosis. He recommended referring patient to [facility].

## 2017-01-22 NOTE — Telephone Encounter (Signed)
Patient's daughter was called and given the lab results. She stated that Dr.Rehman had aslo called her and gave her the results.

## 2017-01-25 DIAGNOSIS — Z886 Allergy status to analgesic agent status: Secondary | ICD-10-CM | POA: Diagnosis not present

## 2017-01-25 DIAGNOSIS — I10 Essential (primary) hypertension: Secondary | ICD-10-CM | POA: Diagnosis not present

## 2017-01-25 DIAGNOSIS — F329 Major depressive disorder, single episode, unspecified: Secondary | ICD-10-CM | POA: Diagnosis not present

## 2017-01-25 DIAGNOSIS — M1990 Unspecified osteoarthritis, unspecified site: Secondary | ICD-10-CM | POA: Diagnosis not present

## 2017-01-25 DIAGNOSIS — F0390 Unspecified dementia without behavioral disturbance: Secondary | ICD-10-CM | POA: Diagnosis not present

## 2017-01-25 DIAGNOSIS — Z87442 Personal history of urinary calculi: Secondary | ICD-10-CM | POA: Diagnosis not present

## 2017-01-25 DIAGNOSIS — K219 Gastro-esophageal reflux disease without esophagitis: Secondary | ICD-10-CM | POA: Diagnosis not present

## 2017-01-25 DIAGNOSIS — E785 Hyperlipidemia, unspecified: Secondary | ICD-10-CM | POA: Diagnosis not present

## 2017-01-25 DIAGNOSIS — T192XXA Foreign body in vulva and vagina, initial encounter: Secondary | ICD-10-CM | POA: Diagnosis not present

## 2017-01-25 DIAGNOSIS — I2510 Atherosclerotic heart disease of native coronary artery without angina pectoris: Secondary | ICD-10-CM | POA: Diagnosis not present

## 2017-01-25 DIAGNOSIS — Z79899 Other long term (current) drug therapy: Secondary | ICD-10-CM | POA: Diagnosis not present

## 2017-02-01 DIAGNOSIS — R41841 Cognitive communication deficit: Secondary | ICD-10-CM | POA: Diagnosis not present

## 2017-02-01 DIAGNOSIS — N12 Tubulo-interstitial nephritis, not specified as acute or chronic: Secondary | ICD-10-CM | POA: Diagnosis not present

## 2017-02-01 DIAGNOSIS — S79911A Unspecified injury of right hip, initial encounter: Secondary | ICD-10-CM | POA: Diagnosis not present

## 2017-02-01 DIAGNOSIS — Z951 Presence of aortocoronary bypass graft: Secondary | ICD-10-CM | POA: Diagnosis not present

## 2017-02-01 DIAGNOSIS — F0390 Unspecified dementia without behavioral disturbance: Secondary | ICD-10-CM | POA: Diagnosis not present

## 2017-02-01 DIAGNOSIS — E785 Hyperlipidemia, unspecified: Secondary | ICD-10-CM | POA: Diagnosis present

## 2017-02-01 DIAGNOSIS — D500 Iron deficiency anemia secondary to blood loss (chronic): Secondary | ICD-10-CM | POA: Diagnosis not present

## 2017-02-01 DIAGNOSIS — A4159 Other Gram-negative sepsis: Secondary | ICD-10-CM | POA: Diagnosis not present

## 2017-02-01 DIAGNOSIS — I25810 Atherosclerosis of coronary artery bypass graft(s) without angina pectoris: Secondary | ICD-10-CM | POA: Diagnosis not present

## 2017-02-01 DIAGNOSIS — I6789 Other cerebrovascular disease: Secondary | ICD-10-CM | POA: Diagnosis not present

## 2017-02-01 DIAGNOSIS — D649 Anemia, unspecified: Secondary | ICD-10-CM | POA: Diagnosis not present

## 2017-02-01 DIAGNOSIS — E873 Alkalosis: Secondary | ICD-10-CM | POA: Diagnosis present

## 2017-02-01 DIAGNOSIS — M6281 Muscle weakness (generalized): Secondary | ICD-10-CM | POA: Diagnosis not present

## 2017-02-01 DIAGNOSIS — Z79899 Other long term (current) drug therapy: Secondary | ICD-10-CM | POA: Diagnosis not present

## 2017-02-01 DIAGNOSIS — F329 Major depressive disorder, single episode, unspecified: Secondary | ICD-10-CM | POA: Diagnosis not present

## 2017-02-01 DIAGNOSIS — A419 Sepsis, unspecified organism: Secondary | ICD-10-CM | POA: Diagnosis not present

## 2017-02-01 DIAGNOSIS — M25552 Pain in left hip: Secondary | ICD-10-CM | POA: Diagnosis not present

## 2017-02-01 DIAGNOSIS — N3000 Acute cystitis without hematuria: Secondary | ICD-10-CM | POA: Diagnosis not present

## 2017-02-01 DIAGNOSIS — I10 Essential (primary) hypertension: Secondary | ICD-10-CM | POA: Diagnosis not present

## 2017-02-01 DIAGNOSIS — B961 Klebsiella pneumoniae [K. pneumoniae] as the cause of diseases classified elsewhere: Secondary | ICD-10-CM | POA: Diagnosis present

## 2017-02-01 DIAGNOSIS — R278 Other lack of coordination: Secondary | ICD-10-CM | POA: Diagnosis not present

## 2017-02-01 DIAGNOSIS — R7881 Bacteremia: Secondary | ICD-10-CM | POA: Diagnosis not present

## 2017-02-01 DIAGNOSIS — Z888 Allergy status to other drugs, medicaments and biological substances status: Secondary | ICD-10-CM | POA: Diagnosis not present

## 2017-02-01 DIAGNOSIS — R064 Hyperventilation: Secondary | ICD-10-CM | POA: Diagnosis not present

## 2017-02-01 DIAGNOSIS — E876 Hypokalemia: Secondary | ICD-10-CM | POA: Diagnosis not present

## 2017-02-01 DIAGNOSIS — R9431 Abnormal electrocardiogram [ECG] [EKG]: Secondary | ICD-10-CM | POA: Diagnosis not present

## 2017-02-01 DIAGNOSIS — K219 Gastro-esophageal reflux disease without esophagitis: Secondary | ICD-10-CM | POA: Diagnosis present

## 2017-02-01 DIAGNOSIS — Z886 Allergy status to analgesic agent status: Secondary | ICD-10-CM | POA: Diagnosis not present

## 2017-02-01 DIAGNOSIS — N390 Urinary tract infection, site not specified: Secondary | ICD-10-CM | POA: Diagnosis not present

## 2017-02-01 DIAGNOSIS — Z9181 History of falling: Secondary | ICD-10-CM | POA: Diagnosis not present

## 2017-02-01 DIAGNOSIS — R262 Difficulty in walking, not elsewhere classified: Secondary | ICD-10-CM | POA: Diagnosis not present

## 2017-02-01 DIAGNOSIS — R251 Tremor, unspecified: Secondary | ICD-10-CM | POA: Diagnosis not present

## 2017-02-01 DIAGNOSIS — R509 Fever, unspecified: Secondary | ICD-10-CM | POA: Diagnosis not present

## 2017-02-01 DIAGNOSIS — S79912A Unspecified injury of left hip, initial encounter: Secondary | ICD-10-CM | POA: Diagnosis not present

## 2017-02-01 DIAGNOSIS — M25551 Pain in right hip: Secondary | ICD-10-CM | POA: Diagnosis not present

## 2017-02-01 DIAGNOSIS — M1990 Unspecified osteoarthritis, unspecified site: Secondary | ICD-10-CM | POA: Diagnosis present

## 2017-02-05 ENCOUNTER — Non-Acute Institutional Stay (SKILLED_NURSING_FACILITY): Payer: Medicare Other | Admitting: Internal Medicine

## 2017-02-05 ENCOUNTER — Inpatient Hospital Stay
Admission: RE | Admit: 2017-02-05 | Discharge: 2017-03-11 | Disposition: A | Discharge status: Home / Self Care | Payer: Medicare Other | Source: Ambulatory Visit | Attending: Internal Medicine | Admitting: Internal Medicine

## 2017-02-05 DIAGNOSIS — R41841 Cognitive communication deficit: Secondary | ICD-10-CM | POA: Diagnosis not present

## 2017-02-05 DIAGNOSIS — F329 Major depressive disorder, single episode, unspecified: Secondary | ICD-10-CM

## 2017-02-05 DIAGNOSIS — N390 Urinary tract infection, site not specified: Secondary | ICD-10-CM | POA: Diagnosis not present

## 2017-02-05 DIAGNOSIS — A419 Sepsis, unspecified organism: Secondary | ICD-10-CM

## 2017-02-05 DIAGNOSIS — E873 Alkalosis: Secondary | ICD-10-CM | POA: Diagnosis not present

## 2017-02-05 DIAGNOSIS — I10 Essential (primary) hypertension: Secondary | ICD-10-CM | POA: Diagnosis not present

## 2017-02-05 DIAGNOSIS — A4159 Other Gram-negative sepsis: Secondary | ICD-10-CM | POA: Diagnosis not present

## 2017-02-05 DIAGNOSIS — R7881 Bacteremia: Secondary | ICD-10-CM | POA: Diagnosis not present

## 2017-02-05 DIAGNOSIS — Z87440 Personal history of urinary (tract) infections: Secondary | ICD-10-CM | POA: Diagnosis not present

## 2017-02-05 DIAGNOSIS — B961 Klebsiella pneumoniae [K. pneumoniae] as the cause of diseases classified elsewhere: Secondary | ICD-10-CM | POA: Diagnosis not present

## 2017-02-05 DIAGNOSIS — D649 Anemia, unspecified: Secondary | ICD-10-CM | POA: Diagnosis not present

## 2017-02-05 DIAGNOSIS — F32A Depression, unspecified: Secondary | ICD-10-CM

## 2017-02-05 DIAGNOSIS — I2510 Atherosclerotic heart disease of native coronary artery without angina pectoris: Secondary | ICD-10-CM | POA: Diagnosis not present

## 2017-02-05 DIAGNOSIS — R278 Other lack of coordination: Secondary | ICD-10-CM | POA: Diagnosis not present

## 2017-02-05 DIAGNOSIS — E785 Hyperlipidemia, unspecified: Secondary | ICD-10-CM | POA: Diagnosis not present

## 2017-02-05 DIAGNOSIS — M6281 Muscle weakness (generalized): Secondary | ICD-10-CM | POA: Diagnosis not present

## 2017-02-05 DIAGNOSIS — R262 Difficulty in walking, not elsewhere classified: Secondary | ICD-10-CM | POA: Diagnosis not present

## 2017-02-05 DIAGNOSIS — F0390 Unspecified dementia without behavioral disturbance: Secondary | ICD-10-CM | POA: Diagnosis not present

## 2017-02-05 DIAGNOSIS — Z9181 History of falling: Secondary | ICD-10-CM | POA: Diagnosis not present

## 2017-02-05 DIAGNOSIS — D500 Iron deficiency anemia secondary to blood loss (chronic): Secondary | ICD-10-CM | POA: Diagnosis not present

## 2017-02-05 NOTE — Progress Notes (Signed)
This is an acute visit.  Level of CARE skilled.  [facility]    Chief complaint acute visit status post hospitalization for sepsis with history of UTI.  History of present nasal-sinus congestion-she denies any fevers or chills.  Patient is a pleasant 83 year old female here for rehab after hospitalization for sepsis secondary to UTI.  She presented to the hospital with fever chills and tremors.  White count was not elevated but she was febrile.  Is found to have a UTI with Klebsiella pneumonia.  She also had increased confusion.  Patient also apparently fell and complained of hip pain but x-ray was negative.  She was given IV antibiotics initially and Rocephin was eventually changed to Zosyn.   she has been discharged on Ceftin for an additional 7 days   she also had hypokalemia and anemia was noted-she did require transfusion apparently her hemoglobin was 8.7 it did rise before discharge up to 11.1  Her potassium also was supplemented and on discharge was 4.1    Currently she is resting in bed comfortably she is on Norvasc 5 mg a day and lisinopril 20 mg twice daily with a history of hypertension.  Systolic is somewhat elevated at times I got 170 manually this afternoon earlier was 128 and before that was in the 170s-- appears there is some variability--we will continue to monitor.  She also has a history of dementia which appears to be fairly mild she is on Namenda twice daily.  Currently she has no acute complaints other than feeling that she has some nasal-sinus congestion-she denies any fever chills or shortness of breath or increased cough beyond baseline.  Past Medical History:  Diagnosis Date  . Baker's cyst, ruptured   . CAD (coronary artery disease)   . Dementia   . History of hepatitis   . Hyperlipidemia   . Hypertension   . Lumbar degenerative disc disease   . Nephrolithiasis   . Spasmodic torticollis          Past Surgical  History:  Procedure Laterality Date  . ABDOMINAL HYSTERECTOMY    . CARPAL TUNNEL RELEASE    . CHOLECYSTECTOMY    . CORONARY ARTERY BYPASS GRAFT  1998   LIMA to LAD-Dx  . KIDNEY STONE SURGERY    . LEFT HEART CATHETERIZATION WITH CORONARY/GRAFT ANGIOGRAM N/A 05/26/2011   Procedure: LEFT HEART CATHETERIZATION WITH Isabel CapriceORONARY/GRAFT ANGIOGRAM;  Surgeon: Othella BoyerWilliam S Tilley, MD;  [facility];  Service: Cardiovascular;  Laterality: N/A;  . LUMBAR LAMINECTOMY           Allergies  Allergen Reactions  . Aspirin     Upset stomach  . Codeine     Upset stomach     Family history she does have a sister who died of colon cancer at age 671   Medications.  Celexa 10 mg nightly.  Crestor 10 mg daily.  Ecotrin 81 mg daily.  Lisinopril 20 mg twice daily.  Namenda 10 mg twice daily.  Norvasc 5 mg daily.  Protonix 40 mg twice daily.  Vitamin D 50,000 units q. weekly on Monday.  Tylenol 650 mg every 6 hours as needed pain.  Ceftin 500 mg 1 tab p.o. twice daily for 7 days.  Robitussin-DM 10 mL every 4 hours as needed cough  Review of systems.  General denies any fever or chills.  Skin does not complain of rashes or itching.  Eyes does not complain of any visual changes.  Ears nose mouth and  throat is not complaining of any sore throat or difficulty swallowing does complain of feeling some nasal-sinus congestion.  Respiratory denies shortness of breath or increased cough from baseline.  Cardiac denies any chest pain or significant lower extremity edema.  GU--says prior to treatment she had significant burning  almost made her want to cry but now says dysuria is much improved  Musculoskeletal is not complaining of joint pain does complain of some weakness.  Neurologic does not complain of dizziness headache or syncope.  Psych does not complain of overt depression or anxiety she is on Celexa for depression as well as Namenda for  dementia.  Physical exam.  Temperature is 97.8--pulse 80 respirations 18 blood pressure taken manually 170/86-again she has some variability as noted above.  In general this is a pleasant somewhat frail elderly female in no distress lying comfortably in bed.  Her skin is warm and dry.  Eyes sclera and conjunctive are clear pupils appear reactive to light visual acuity appears grossly intact.  Nose I could not really appreciate any active drainage.  Oropharynx is clear mucous membranes moist do not note any increased erythema beyond normal.  Chest is clear to auscultation there is no labored breathing.  Heart is regular rate and rhythm without murmur gallop or rub.  Abdomen is soft somewhat protuberant nontender with positive bowel sounds.  GU could not really appreciate suprapubic tenderness.  Musculoskeletal she moves all extremities x4 limited exam since she is in bed strength appears to be grossly intact all 4 extremities.  Neurologic is grossly her speech is clear no lateralizing findings.  Psych she appears grossly alert and oriented I would classify her dementia as fairly mild.  Labs.  Liver 16 2018.  WBC 6.5 hemoglobin 11.1.  Sodium 142 potassium 4.1 BUN 17 creatinine 1.03.  Assessment and plan.  1.  History of sepsis-UTI-she is completing treatment for Klebsiella pneumonia- she has 7 days left of Ceftin-she says her symptoms are considerably improved.  2.  Hypertension at this point will monitor she has variable systolics as noted above-she is on Norvasc 5 mg a day as well as lisinopril 20 mg twice daily.  3.-She continues on Namenda dementia-this appears to be quite mild at this point will monitor.  4-allergic rhinitis?  Will treat with Flonase 1 spray twice daily for 5 days and monitor she does not report any increase cough or congestion or shortness of breath but this will have to be watched.  5.-Depression she continues on Celexa at this appears to be well  controlled at this point.  6.  History of hyperlipidemia she does continue on a statin-.  7.  Anemia status post transfusion hemoglobin was 11.1 on discharge will update this first laboratory day next week.  8.  History of hypokalemia again will update a metabolic panel first laboratory day next week.  - [id number] of note greater than 40 minutes spent assessing patient- reviewing her chart.    Reviewed her labs-coordinating and formulating a plan of care for numerous diagnoses-of note greater than 50% of time spent coordinating plan of care:

## 2017-02-08 ENCOUNTER — Encounter: Payer: Self-pay | Admitting: Internal Medicine

## 2017-02-08 ENCOUNTER — Encounter (HOSPITAL_COMMUNITY)
Admission: RE | Admit: 2017-02-08 | Discharge: 2017-02-08 | Disposition: A | Discharge status: Home / Self Care | Payer: Medicare Other | Source: Skilled Nursing Facility | Attending: Internal Medicine | Admitting: Internal Medicine

## 2017-02-08 ENCOUNTER — Non-Acute Institutional Stay (SKILLED_NURSING_FACILITY): Payer: Medicare Other | Admitting: Internal Medicine

## 2017-02-08 DIAGNOSIS — E785 Hyperlipidemia, unspecified: Secondary | ICD-10-CM

## 2017-02-08 DIAGNOSIS — F0390 Unspecified dementia without behavioral disturbance: Secondary | ICD-10-CM | POA: Diagnosis not present

## 2017-02-08 DIAGNOSIS — N390 Urinary tract infection, site not specified: Secondary | ICD-10-CM

## 2017-02-08 DIAGNOSIS — I10 Essential (primary) hypertension: Secondary | ICD-10-CM | POA: Diagnosis not present

## 2017-02-08 DIAGNOSIS — B961 Klebsiella pneumoniae [K. pneumoniae] as the cause of diseases classified elsewhere: Secondary | ICD-10-CM | POA: Diagnosis not present

## 2017-02-08 DIAGNOSIS — I2510 Atherosclerotic heart disease of native coronary artery without angina pectoris: Secondary | ICD-10-CM

## 2017-02-08 DIAGNOSIS — B9689 Other specified bacterial agents as the cause of diseases classified elsewhere: Secondary | ICD-10-CM

## 2017-02-08 DIAGNOSIS — E876 Hypokalemia: Secondary | ICD-10-CM | POA: Insufficient documentation

## 2017-02-08 LAB — BASIC METABOLIC PANEL
Anion gap: 4 — ABNORMAL LOW (ref 5–15)
BUN: 17 mg/dL (ref 6–20)
CHLORIDE: 106 mmol/L (ref 101–111)
CO2: 29 mmol/L (ref 22–32)
CREATININE: 0.9 mg/dL (ref 0.44–1.00)
Calcium: 8.3 mg/dL — ABNORMAL LOW (ref 8.9–10.3)
GFR calc Af Amer: >60 mL/min (ref >60)
GFR calc non Af Amer: 58 mL/min — ABNORMAL LOW (ref >60)
GLUCOSE: 106 mg/dL — AB (ref 65–99)
Potassium: 3.9 mmol/L (ref 3.5–5.1)
Sodium: 139 mmol/L (ref 135–145)

## 2017-02-08 LAB — CBC WITH DIFFERENTIAL/PLATELET
Basophils Absolute: 0 10*3/uL (ref 0.0–0.1)
Basophils Relative: 0 %
EOS ABS: 0.2 10*3/uL (ref 0.0–0.7)
EOS PCT: 3 %
HCT: 32.7 % — ABNORMAL LOW (ref 36.0–46.0)
HEMOGLOBIN: 10.6 g/dL — AB (ref 12.0–15.0)
LYMPHS ABS: 1.4 10*3/uL (ref 0.7–4.0)
Lymphocytes Relative: 19 %
MCH: 29.5 pg (ref 26.0–34.0)
MCHC: 32.4 g/dL (ref 30.0–36.0)
MCV: 91.1 fL (ref 78.0–100.0)
MONO ABS: 0.8 10*3/uL (ref 0.1–1.0)
MONOS PCT: 12 %
NEUTROS PCT: 66 %
Neutro Abs: 4.6 10*3/uL (ref 1.7–7.7)
Platelets: 299 10*3/uL (ref 150–400)
RBC: 3.59 MIL/uL — ABNORMAL LOW (ref 3.87–5.11)
RDW: 13.5 % (ref 11.5–15.5)
WBC: 7 10*3/uL (ref 4.0–10.5)

## 2017-02-08 NOTE — Progress Notes (Signed)
Provider:   Location:  [facility]   Place of Service:  SNF (31)  PCP: Ignatius SpeckingVyas, Dhruv B, MD Patient Care Team: Ignatius SpeckingVyas, Dhruv B, MD as PCP - General (Internal Medicine) Othella Boyerilley, William S, MD as Consulting Physician (Cardiology)  Extended Emergency Contact Information Primary Emergency Contact: [facility],Frank Address: [address]          [place], [place] [address] [place] Home Phone: [phone] Relation: Spouse Secondary Emergency Contact: Charyl Dancerraven,Diane  [place] Home Phone: [phone] Mobile Phone: [phone] Relation: Daughter  Code Status:  Goals of Care: Advanced Directive information Advanced Directives 01/15/2017  Does Patient Have a Medical Advance Directive? Yes  Type of Advance Directive Living will;Healthcare Power of Attorney  Copy of Healthcare Power of Attorney in Chart? Yes  Pre-existing out of facility DNR order (yellow form or pink MOST form) -      Chief Complaint  Patient presents with  . New Admit To SNF    HPI: Patient is a 83 y.o. female seen today for admission to the SNF for therapy.after she was admitted to Hospital from 11/12-11/16 for Urosepsis.  Patient has h/o CAD S/P CABG in 1998, Hypertension , hyperlipidemia and Dementia  According to the patient she fell while going to the bathroom. And her husband brought her to the ED. She was admitted to the hospital with the diagnosis of Urosepsis , and Altered mental status. She was treated with Antibiotics.ans Fluids She was discharged on . Cefuroxime for 7 more days   She also was found to have anemia with Hgb of 8.7 with no active bleed. She was transfused Blood. Patient is doing well in facility . She was c/o Low back pain mainly  after Therapy. She denies any fever or chills. Patient lives with his Husband. Her daughters help her. She was not driving before . Walks with cane sometimes.   Past Medical History:  Diagnosis Date  . Baker's cyst, ruptured    . CAD (coronary artery disease)   . Dementia   . History of hepatitis   . Hyperlipidemia   . Hypertension   . Lumbar degenerative disc disease   . Nephrolithiasis   . Spasmodic torticollis    Past Surgical History:  Procedure Laterality Date  . ABDOMINAL HYSTERECTOMY    . CARPAL TUNNEL RELEASE    . CHOLECYSTECTOMY    . CORONARY ARTERY BYPASS GRAFT  1998   LIMA to LAD-Dx  . ESOPHAGOGASTRODUODENOSCOPY (EGD) N/A 01/15/2017   Performed by Malissa Hippoehman, Najeeb U, MD at [facility]  . KIDNEY STONE SURGERY    . LEFT HEART CATHETERIZATION WITH CORONARY/GRAFT ANGIOGRAM N/A 05/26/2011   Performed by Othella Boyerilley, William S, [facility]  . LUMBAR LAMINECTOMY      reports that  has never smoked. she has never used smokeless tobacco. She reports that she does not drink alcohol or use drugs. Social History   Socioeconomic History  . Marital status: Married    Spouse name: Not on file  . Number of children: Not on file  . Years of education: Not on file  . Highest education level: Not on file  Social Needs  . Financial resource strain: Not on file  . Food insecurity - worry: Not on file  . Food insecurity - inability: Not on file  . Transportation needs - medical: Not on file  . Transportation needs - non-medical: Not on file  Occupational History  . Not on file  Tobacco  Use  . Smoking status: Never Smoker  . Smokeless tobacco: Never Used  Substance and Sexual Activity  . Alcohol use: No  . Drug use: No  . Sexual activity: Not on file  Other Topics Concern  . Not on file  Social History Narrative   Divorced, remarried.     Functional Status Survey:    Family History  Problem Relation Age of Onset  . Diabetes Sister   . Diabetes Sister     Health Maintenance  Topic Date Due  . Janet BerlinETANUS/TDAP  05/10/1951  . DEXA SCAN  05/11/1999  . PNA vac Low Risk Adult (1 of 2 - PCV13) 05/11/1999  . INFLUENZA VACCINE  10/21/2016    Allergies  Allergen Reactions  . Aspirin      Upset stomach  . Codeine     Upset stomach    Outpatient Encounter Medications as of 02/08/2017  Medication Sig  . amLODipine (NORVASC) 5 MG tablet Take 5 mg by mouth daily.  Marland Kitchen. aspirin EC 81 MG tablet Take 81 mg by mouth daily.  . citalopram (CELEXA) 10 MG tablet Take 10 mg by mouth daily.  . Garlic 1000 MG CAPS Take 1,000 mg by mouth 2 (two) times daily.  Marland Kitchen. gemfibrozil (LOPID) 600 MG tablet Take 600 mg by mouth daily with breakfast.   . lisinopril (PRINIVIL,ZESTRIL) 20 MG tablet Take 20 mg by mouth 2 (two) times daily.   . memantine (NAMENDA) 10 MG tablet Take 10 mg by mouth 2 (two) times daily.  . Omega-3 Fatty Acids (FISH OIL) 1200 MG CAPS Take 1,200 mg by mouth 2 (two) times daily.  Marland Kitchen. oxymetazoline (AFRIN) 0.05 % nasal spray Place 1 spray into both nostrils 2 (two) times daily as needed for congestion.  . pantoprazole (PROTONIX) 40 MG tablet Take 20 mg by mouth 2 (two) times daily.   Bertram Gala. Polyethyl Glycol-Propyl Glycol (LUBRICANT EYE DROPS) 0.4-0.3 % SOLN Place 1-2 drops into both eyes 2 (two) times daily.  . polyethylene glycol powder (GLYCOLAX/MIRALAX) powder Take 17 g by mouth daily as needed. For constipation.  . rosuvastatin (CRESTOR) 10 MG tablet Take 10 mg by mouth daily.  . vitamin C (ASCORBIC ACID) 500 MG tablet Take 500 mg by mouth daily.  . [DISCONTINUED] Meclizine HCl 25 MG CHEW Chew 25 mg by mouth 3 (three) times daily as needed (for vertigo/dizziness).   No facility-administered encounter medications on file as of 02/08/2017.     Review of Systems  Review of Systems  Constitutional: Negative for activity change, appetite change, chills, diaphoresis, fatigue and fever.  HENT: Negative for mouth sores, postnasal drip, rhinorrhea, sinus pain and sore throat.   Respiratory: Negative for apnea, cough, chest tightness, shortness of breath and wheezing.   Cardiovascular: Negative for chest pain, palpitations and leg swelling.  Gastrointestinal: Negative for abdominal  distention, abdominal pain, constipation, diarrhea, nausea and vomiting.  Genitourinary: Negative for dysuria and frequency.  Musculoskeletal: Negative for arthralgias, joint swelling and myalgias.  Skin: Negative for rash.  Neurological: Negative for dizziness, syncope, weakness, light-headedness and numbness.  Psychiatric/Behavioral: Negative for behavioral problems, confusion and sleep disturbance.     Vitals:   02/08/17 1057  BP: (!) 147/70  Pulse: (!) 56  Resp: 18  Temp: 98.5 F (36.9 C)   There is no height or weight on file to calculate BMI. Physical Exam  Constitutional: She is oriented to person, place, and time. She appears well-developed and well-nourished.  HENT:  Head: Normocephalic.  Mouth/Throat: Oropharynx is clear  and moist.  Eyes: Pupils are equal, round, and reactive to light.  Neck: Neck supple.  Cardiovascular: Normal rate and normal heart sounds.  No murmur heard. Pulmonary/Chest: Effort normal and breath sounds normal. No respiratory distress. She has no wheezes. She has no rales.  Abdominal: Soft. Bowel sounds are normal. She exhibits no distension. There is no tenderness. There is no rebound.  Musculoskeletal:  Has mild edema in Both LE  Lymphadenopathy:    She has no cervical adenopathy.  Neurological: She is alert and oriented to person, place, and time.  Had good strength in all extremities with no focal deficit.   Skin: Skin is warm and dry. No rash noted. No erythema.  Psychiatric: She has a normal mood and affect. Her behavior is normal. Thought content normal.    Labs reviewed: Basic Metabolic Panel: Recent Labs    12/24/16 1022 01/15/17 1134 02/08/17 0545  NA  --   --  139  K  --   --  3.9  CL  --   --  106  CO2  --   --  29  GLUCOSE  --   --  106*  BUN  --   --  17  CREATININE 1.13* 0.98 0.90  CALCIUM  --   --  8.3*   Liver Function Tests: Recent Labs    01/15/17 1134  AST 15  ALT 12*  ALKPHOS 48  BILITOT 0.6  PROT 5.9*    ALBUMIN 3.5   No results for input(s): LIPASE, AMYLASE in the last 8760 hours. No results for input(s): AMMONIA in the last 8760 hours. CBC: Recent Labs    01/15/17 1134 02/08/17 0545  WBC 4.0 7.0  NEUTROABS  --  4.6  HGB 9.7* 10.6*  HCT 28.2* 32.7*  MCV 91.0 91.1  PLT 186 299   Cardiac Enzymes: No results for input(s): CKTOTAL, CKMB, CKMBINDEX, TROPONINI in the last 8760 hours. BNP: Invalid input(s): POCBNP Lab Results  Component Value Date   HGBA1C 6.0 (H) 05/25/2011   No results found for: TSH No results found for: VITAMINB12 No results found for: FOLATE No results found for: IRON, TIBC, FERRITIN  Imaging and Procedures obtained prior to SNF admission: No results found.  Assessment/Plan UTI due to Klebsiella species Patient in Cefuroxime for 7 days. No Symptoms now.  Doing well with therapy.  Essential hypertension BP mildly Elevated Continue to monitor. Om lisinopril and Norvasc.  CAD On Aspirin and statin   Dementia  She is on Namenda Anemia Was transfused in the hospital. HGB stable here. Per PCP  GI work up is planned as out patient She did have EGD did which was normal in 10/18 On High Dose of Protonix  Hyperlipidemia,  On Crestor  Disposition Plan for discharge home with her husband.   Family/ staff Communication:   Labs/tests ordered: Repeat CMP, CBC in 1 week.  Total time spent in this patient care encounter was _45 minutes; greater than 50% of the visit spent counseling patient, reviewing records , Labs and coordinating care for problems addressed at this encounter.

## 2017-02-15 ENCOUNTER — Encounter (HOSPITAL_COMMUNITY)
Admission: RE | Admit: 2017-02-15 | Discharge: 2017-02-15 | Disposition: A | Discharge status: Home / Self Care | Payer: Medicare Other | Source: Skilled Nursing Facility | Attending: Internal Medicine | Admitting: Internal Medicine

## 2017-02-15 LAB — COMPREHENSIVE METABOLIC PANEL
ALT: 12 U/L — AB (ref 14–54)
AST: 16 U/L (ref 15–41)
Albumin: 3 g/dL — ABNORMAL LOW (ref 3.5–5.0)
Alkaline Phosphatase: 56 U/L (ref 38–126)
Anion gap: 7 (ref 5–15)
BUN: 28 mg/dL — AB (ref 6–20)
CHLORIDE: 103 mmol/L (ref 101–111)
CO2: 27 mmol/L (ref 22–32)
CREATININE: 0.93 mg/dL (ref 0.44–1.00)
Calcium: 8.4 mg/dL — ABNORMAL LOW (ref 8.9–10.3)
GFR calc Af Amer: >60 mL/min (ref >60)
GFR, EST NON AFRICAN AMERICAN: 56 mL/min — AB (ref >60)
GLUCOSE: 106 mg/dL — AB (ref 65–99)
Potassium: 3.7 mmol/L (ref 3.5–5.1)
SODIUM: 137 mmol/L (ref 135–145)
Total Bilirubin: 0.6 mg/dL (ref 0.3–1.2)
Total Protein: 6 g/dL — ABNORMAL LOW (ref 6.5–8.1)

## 2017-02-15 LAB — CBC
HCT: 31.9 % — ABNORMAL LOW (ref 36.0–46.0)
Hemoglobin: 10.2 g/dL — ABNORMAL LOW (ref 12.0–15.0)
MCH: 29.5 pg (ref 26.0–34.0)
MCHC: 32 g/dL (ref 30.0–36.0)
MCV: 92.2 fL (ref 78.0–100.0)
PLATELETS: 269 10*3/uL (ref 150–400)
RBC: 3.46 MIL/uL — ABNORMAL LOW (ref 3.87–5.11)
RDW: 14.1 % (ref 11.5–15.5)
WBC: 7.9 10*3/uL (ref 4.0–10.5)

## 2017-03-03 ENCOUNTER — Other Ambulatory Visit (HOSPITAL_COMMUNITY)
Admission: RE | Admit: 2017-03-03 | Discharge: 2017-03-03 | Disposition: A | Discharge status: Home / Self Care | Payer: Medicare Other | Source: Other Acute Inpatient Hospital | Attending: Internal Medicine | Admitting: Internal Medicine

## 2017-03-03 DIAGNOSIS — N390 Urinary tract infection, site not specified: Secondary | ICD-10-CM | POA: Insufficient documentation

## 2017-03-03 LAB — URINALYSIS, ROUTINE W REFLEX MICROSCOPIC
BILIRUBIN URINE: NEGATIVE
Glucose, UA: NEGATIVE mg/dL
Ketones, ur: NEGATIVE mg/dL
NITRITE: NEGATIVE
PROTEIN: 100 mg/dL — AB
Specific Gravity, Urine: 1.014 (ref 1.005–1.030)
pH: 6 (ref 5.0–8.0)

## 2017-03-05 LAB — URINE CULTURE

## 2017-03-06 ENCOUNTER — Encounter (HOSPITAL_COMMUNITY)
Admission: RE | Admit: 2017-03-06 | Discharge: 2017-03-06 | Disposition: A | Discharge status: Home / Self Care | Payer: Medicare Other | Source: Skilled Nursing Facility | Attending: Internal Medicine | Admitting: Internal Medicine

## 2017-03-06 DIAGNOSIS — E876 Hypokalemia: Secondary | ICD-10-CM | POA: Insufficient documentation

## 2017-03-06 LAB — URINALYSIS, ROUTINE W REFLEX MICROSCOPIC
BILIRUBIN URINE: NEGATIVE
Bacteria, UA: NONE SEEN
Glucose, UA: NEGATIVE mg/dL
KETONES UR: NEGATIVE mg/dL
Nitrite: NEGATIVE
PROTEIN: 100 mg/dL — AB
SQUAMOUS EPITHELIAL / LPF: NONE SEEN
Specific Gravity, Urine: 1.012 (ref 1.005–1.030)
pH: 6 (ref 5.0–8.0)

## 2017-03-09 LAB — URINE CULTURE: Culture: 50000 — AB

## 2017-03-10 ENCOUNTER — Non-Acute Institutional Stay (SKILLED_NURSING_FACILITY): Payer: Medicare Other | Admitting: Internal Medicine

## 2017-03-10 ENCOUNTER — Encounter: Payer: Self-pay | Admitting: Internal Medicine

## 2017-03-10 DIAGNOSIS — D649 Anemia, unspecified: Secondary | ICD-10-CM

## 2017-03-10 DIAGNOSIS — I2510 Atherosclerotic heart disease of native coronary artery without angina pectoris: Secondary | ICD-10-CM

## 2017-03-10 DIAGNOSIS — F0390 Unspecified dementia without behavioral disturbance: Secondary | ICD-10-CM | POA: Diagnosis not present

## 2017-03-10 DIAGNOSIS — I10 Essential (primary) hypertension: Secondary | ICD-10-CM | POA: Diagnosis not present

## 2017-03-10 DIAGNOSIS — Z87440 Personal history of urinary (tract) infections: Secondary | ICD-10-CM | POA: Diagnosis not present

## 2017-03-10 NOTE — Progress Notes (Signed)
Location:   [facility] Room Number: 129/P Place of Service:  SNF (31)  Provider: Edmon Crape  PCP: Ignatius Specking, MD Patient Care Team: Ignatius Specking, MD as PCP - General (Internal Medicine) Gloria Boyer, MD as Consulting Physician (Cardiology)  Extended Emergency Contact Information Primary Emergency Contact: [facility] Address: [address]          [place], [place] [address] [place] Home Phone: [phone] Relation: Spouse Secondary Emergency Contact: Charyl Dancer [place] Home Phone: [phone] Mobile Phone: [phone] Relation: Daughter  Code Status: Full Code Goals of care:  Advanced Directive information Advanced Directives 03/10/2017  Does Patient Have a Medical Advance Directive? Yes  Type of Advance Directive (No Data)  Does patient want to make changes to medical advance directive? No - Patient declined  Copy of Healthcare Power of Attorney in Chart? -  Pre-existing out of facility DNR order (yellow form or pink MOST form) -     Allergies  Allergen Reactions  . Aspirin     Upset stomach  . Codeine     Upset stomach    Chief Complaint  Patient presents with  . Discharge Note    Discharge Visit    HPI:  83 y.o. female seen today for discharge from facility tomorrow. She was hospitalized last month for urosepsis was treated with antibiotics this appears to have resolved here.  She also has a history of coronary artery disease status post CABG as well as hypertension hyperlipidemia and dementia.  Her stay here has been quite unremarkable she is now ambulating and appears to be doing well although she says she still has some weakness.  She has been afebrile vital signs appear to be generally stable I did get a systolic in the 150s this evening generally it appears her systolics are more in the 120s-150's --I do not see consistent elevations -she is on lisinopril and Norvasc.  She  did have a urine culture done which only showed 50,000 colonies of Oneita Kras is asymptomatic denies any dysuria fever chills  Regards to dementia this appears to be fairly mild she does live at home with her husband and appears to be doing quite well here.  She does have a history of anemia--there was no active bleeding in the hospital but her hemoglobin at one point was 8.7 and she did receive a transfusion hemoglobin appears to be stable here but will recheck that before discharge.  She also has a history of hypokalemia and this was apparently supplemented in the hospital as well last potassium was 3.7 on November 26 will recheck this before discharge as well.  Currently she is sitting in her room comfortably has no complaints she is just finished supper and appears she is eating most everything     Past Medical History:  Diagnosis Date  . Baker's cyst, ruptured   . CAD (coronary artery disease)   . Dementia   . History of hepatitis   . Hyperlipidemia   . Hypertension   . Lumbar degenerative disc disease   . Nephrolithiasis   . Spasmodic torticollis     Past Surgical History:  Procedure Laterality Date  . ABDOMINAL HYSTERECTOMY    . CARPAL TUNNEL RELEASE    . CHOLECYSTECTOMY    . CORONARY ARTERY BYPASS GRAFT  1998   LIMA to LAD-Dx  . ESOPHAGOGASTRODUODENOSCOPY N/A 01/15/2017   Procedure: ESOPHAGOGASTRODUODENOSCOPY (EGD);  Surgeon: Malissa Hippo, MD;  [facility];  Service: Endoscopy;  Laterality: N/A;  1:00  . KIDNEY STONE SURGERY    . LEFT HEART CATHETERIZATION WITH CORONARY/GRAFT ANGIOGRAM N/A 05/26/2011   Procedure: LEFT HEART CATHETERIZATION WITH Isabel Caprice;  Surgeon: Gloria Boyer, MD;  Location: [facility] CATH LAB;  Service: Cardiovascular;  Laterality: N/A;  . LUMBAR LAMINECTOMY        reports that  has never smoked. she has never used smokeless tobacco. She reports that she does not drink alcohol or use drugs. Social History    Socioeconomic History  . Marital status: Married    Spouse name: Not on file  . Number of children: Not on file  . Years of education: Not on file  . Highest education level: Not on file  Social Needs  . Financial resource strain: Not on file  . Food insecurity - worry: Not on file  . Food insecurity - inability: Not on file  . Transportation needs - medical: Not on file  . Transportation needs - non-medical: Not on file  Occupational History  . Not on file  Tobacco Use  . Smoking status: Never Smoker  . Smokeless tobacco: Never Used  Substance and Sexual Activity  . Alcohol use: No  . Drug use: No  . Sexual activity: Not on file  Other Topics Concern  . Not on file  Social History Narrative   Divorced, remarried.    Functional Status Survey:    Allergies  Allergen Reactions  . Aspirin     Upset stomach  . Codeine     Upset stomach    Pertinent  Health Maintenance Due  Topic Date Due  . INFLUENZA VACCINE  04/10/2017 (Originally 10/21/2016)  . DEXA SCAN  04/10/2017 (Originally 05/11/1999)  . PNA vac Low Risk Adult (1 of 2 - PCV13) 04/10/2017 (Originally 05/11/1999)    Medications: Outpatient Encounter Medications as of 03/10/2017  Medication Sig  . acetaminophen (TYLENOL) 650 MG CR tablet Take 650 mg by mouth every 6 (six) hours as needed for pain.  Marland Kitchen amLODipine (NORVASC) 5 MG tablet Take 5 mg by mouth daily.  Marland Kitchen aspirin EC 81 MG tablet Take 81 mg by mouth daily.  . cefUROXime (CEFTIN) 500 MG tablet Take 500 mg by mouth 2 (two) times daily with a meal.  . citalopram (CELEXA) 10 MG tablet Take 10 mg by mouth daily.  Marland Kitchen Dextromethorphan-Guaifenesin (ROBITUSSIN COUGH+CHEST CONG DM) 5-100 MG/5ML LIQD Give 10 ml as needed for cough every 4 hours prn  . lisinopril (PRINIVIL,ZESTRIL) 20 MG tablet Take 20 mg by mouth 2 (two) times daily.   . memantine (NAMENDA) 10 MG tablet Take 10 mg by mouth 2 (two) times daily.  . pantoprazole (PROTONIX) 40 MG tablet Take 20 mg by mouth  2 (two) times daily.   . polyethylene glycol powder (GLYCOLAX/MIRALAX) powder Take 17 g by mouth daily as needed. For constipation.  . Probiotic Product (RISA-BID PROBIOTIC PO) Take 1 tablet by mouth twice a day for ten days from 03/06/2017-03/15/2017  . rosuvastatin (CRESTOR) 10 MG tablet Take 10 mg by mouth daily.  . Vitamin D, Ergocalciferol, (DRISDOL) 50000 units CAPS capsule Take 50,000 Units by mouth every 7 (seven) days.  . [DISCONTINUED] Garlic 1000 MG CAPS Take 1,000 mg by mouth 2 (two) times daily.  . [DISCONTINUED] gemfibrozil (LOPID) 600 MG tablet Take 600 mg by mouth daily with breakfast.   . [DISCONTINUED] Omega-3 Fatty Acids (FISH OIL) 1200 MG CAPS Take 1,200 mg by mouth 2 (two) times daily.  . [  DISCONTINUED] oxymetazoline (AFRIN) 0.05 % nasal spray Place 1 spray into both nostrils 2 (two) times daily as needed for congestion.  . [DISCONTINUED] Polyethyl Glycol-Propyl Glycol (LUBRICANT EYE DROPS) 0.4-0.3 % SOLN Place 1-2 drops into both eyes 2 (two) times daily.  . [DISCONTINUED] vitamin C (ASCORBIC ACID) 500 MG tablet Take 500 mg by mouth daily.   No facility-administered encounter medications on file as of 03/10/2017.      Review of Systems In general she is not complaining of any fever chills weight appears to have been stable during her stay here.  Skin does not complain of rashes itching or diaphoresis.  Head ears eyes nose mouth and throat is not complaining of any visual changes or sore throat.  Respiratory denies shortness of breath or cough.  Cardiac does not complain of any chest pain has some mild lower extremity edema she says this is dependent edema whenever she has her feet down for an extended period of time.  GI is not complaining of any abdominal pain nausea vomiting diarrhea constipation.  GU does not complain of dysuria has completed treatment for UTI.  Musculoskeletal says she still bit weak but denies any joint pain she is now ambulatory.  With a  rolling walker  Neurologic does not complain of dizziness headache or syncope.  Psych has a history of dementia but this appears to be quite mild as well as depression which appears to be stable -- nursing staff has not reported any issues  I Physical Exam   She is afebrile pulse of 58 respirations 17 blood pressure taken manually 158/52-weight is 127 pound  In general this is a very pleasant somewhat frail-appearing elderly female in no distress.  Her skin is warm and dry.  Eyes visual acuity appears grossly intact sclera and conjunctive are clear.  Oropharynx is clear mucous membranes moist.  Chest is clear to auscultation there is no labored breathing.  Heart  is regular rate and rhythm borderline bradycardic in the high 50s without murmur gallop or rub she has trace lower extremity edema she describes this as chronic dependent edema which occurs when she has her feet in a dependent position for a  period of time  Abdomen is soft nontender with positive bowel sounds.  Musculoskeletal she is ambulatory has mild edema lower extremities bilaterally this is cool to touch nonerythematous nontender.  Neurologic is grossly intact her speech is clear no lateralizing findings.  Psych she is alert and oriented largely pleasant and appropriate I would classify her dementia as fairly mild    Labs reviewed: Basic Metabolic Panel: Recent Labs    01/15/17 1134 02/08/17 0545 02/15/17 0330  NA  --  139 137  K  --  3.9 3.7  CL  --  106 103  CO2  --  29 27  GLUCOSE  --  106* 106*  BUN  --  17 28*  CREATININE 0.98 0.90 0.93  CALCIUM  --  8.3* 8.4*   Liver Function Tests: Recent Labs    01/15/17 1134 02/15/17 0330  AST 15 16  ALT 12* 12*  ALKPHOS 48 56  BILITOT 0.6 0.6  PROT 5.9* 6.0*  ALBUMIN 3.5 3.0*   No results for input(s): LIPASE, AMYLASE in the last 8760 hours. No results for input(s): AMMONIA in the last 8760 hours. CBC: Recent Labs    01/15/17 1134  02/08/17 0545 02/15/17 0330  WBC 4.0 7.0 7.9  NEUTROABS  --  4.6  --   HGB 9.7*  10.6* 10.2*  HCT 28.2* 32.7* 31.9*  MCV 91.0 91.1 92.2  PLT 186 299 269   Cardiac Enzymes: No results for input(s): CKTOTAL, CKMB, CKMBINDEX, TROPONINI in the last 8760 hours. BNP: Invalid input(s): POCBNP CBG: No results for input(s): GLUCAP in the last 8760 hours.  Procedures and Imaging Studies During Stay: No results found.  Assessment/Plan:    : #1 history of UTI urosepsis-repeat urine culture only showed 50,000 colonies of Klebsiella she is asymptomatic-she has completed antibiotic will defer follow-up to primary care provider.  2.  Hypertension occasionally she will have systolics in the 150s but this does not appear to be consistent at this point would recommend continuing monitoring she is on lisinopril and Norvasc follow-up with primary care provider as needed.  3.  Coronary artery disease this is been asymptomatic during her stay here she is on aspirin and statin.  4.  History of dementia I would classify this is quite mild -- she is on Namenda.  5.  Anemia she did require transfusion in the hospital hemoglobin most recently was 10.2 on November 26 will update this before discharge she is on Protonix per primary care provider apparently GI workup is planned as an outpatient.  6.  History of hyperlipidemia continues on Crestor since her stay here was quite short was not aggressive pursuing a lipid panel.  7.  History of depression this is quite stable on Celexa.  8.  History of hypokalemia this was supplemented apparently in the hospital will recheck a BMP before discharge to ensure stability.  Again will update a CBC and metabolic panel before discharge tomorrow-she will be going home with her husband she will need continued PT and OT for strengthening as well as nursing support for her multiple medical issues-.  [id number]-of note greater than 30 minutes spent on this discharge  summary-greater than 50% of time spent coordinating a plan of care for numerous diagnoses

## 2017-03-11 ENCOUNTER — Encounter (HOSPITAL_COMMUNITY)
Admission: AD | Admit: 2017-03-11 | Discharge: 2017-03-11 | Disposition: A | Discharge status: Home / Self Care | Payer: Medicare Other | Source: Skilled Nursing Facility | Attending: Internal Medicine | Admitting: Internal Medicine

## 2017-03-11 DIAGNOSIS — Z9181 History of falling: Secondary | ICD-10-CM | POA: Insufficient documentation

## 2017-03-11 DIAGNOSIS — D500 Iron deficiency anemia secondary to blood loss (chronic): Secondary | ICD-10-CM | POA: Insufficient documentation

## 2017-03-11 DIAGNOSIS — R630 Anorexia: Secondary | ICD-10-CM | POA: Insufficient documentation

## 2017-03-11 DIAGNOSIS — R7881 Bacteremia: Secondary | ICD-10-CM | POA: Insufficient documentation

## 2017-03-11 DIAGNOSIS — N390 Urinary tract infection, site not specified: Secondary | ICD-10-CM | POA: Insufficient documentation

## 2017-03-11 DIAGNOSIS — R262 Difficulty in walking, not elsewhere classified: Secondary | ICD-10-CM | POA: Insufficient documentation

## 2017-03-11 LAB — CBC WITH DIFFERENTIAL/PLATELET
BASOS ABS: 0 10*3/uL (ref 0.0–0.1)
Basophils Relative: 0 %
Eosinophils Absolute: 0.1 10*3/uL (ref 0.0–0.7)
Eosinophils Relative: 2 %
HEMATOCRIT: 31.8 % — AB (ref 36.0–46.0)
Hemoglobin: 10.2 g/dL — ABNORMAL LOW (ref 12.0–15.0)
LYMPHS ABS: 1 10*3/uL (ref 0.7–4.0)
LYMPHS PCT: 26 %
MCH: 29.7 pg (ref 26.0–34.0)
MCHC: 32.1 g/dL (ref 30.0–36.0)
MCV: 92.7 fL (ref 78.0–100.0)
MONO ABS: 0.5 10*3/uL (ref 0.1–1.0)
Monocytes Relative: 14 %
NEUTROS ABS: 2.3 10*3/uL (ref 1.7–7.7)
Neutrophils Relative %: 58 %
Platelets: 233 10*3/uL (ref 150–400)
RBC: 3.43 MIL/uL — AB (ref 3.87–5.11)
RDW: 14.7 % (ref 11.5–15.5)
WBC: 3.9 10*3/uL — ABNORMAL LOW (ref 4.0–10.5)

## 2017-03-11 LAB — BASIC METABOLIC PANEL
ANION GAP: 9 (ref 5–15)
BUN: 28 mg/dL — AB (ref 6–20)
CO2: 27 mmol/L (ref 22–32)
Calcium: 8.6 mg/dL — ABNORMAL LOW (ref 8.9–10.3)
Chloride: 104 mmol/L (ref 101–111)
Creatinine, Ser: 1.04 mg/dL — ABNORMAL HIGH (ref 0.44–1.00)
GFR calc Af Amer: 56 mL/min — ABNORMAL LOW (ref >60)
GFR calc non Af Amer: 49 mL/min — ABNORMAL LOW (ref >60)
GLUCOSE: 102 mg/dL — AB (ref 65–99)
POTASSIUM: 4 mmol/L (ref 3.5–5.1)
Sodium: 140 mmol/L (ref 135–145)

## 2017-03-12 DIAGNOSIS — E7800 Pure hypercholesterolemia, unspecified: Secondary | ICD-10-CM | POA: Diagnosis not present

## 2017-03-12 DIAGNOSIS — I10 Essential (primary) hypertension: Secondary | ICD-10-CM | POA: Diagnosis not present

## 2017-03-12 DIAGNOSIS — F0390 Unspecified dementia without behavioral disturbance: Secondary | ICD-10-CM | POA: Diagnosis not present

## 2017-03-14 DIAGNOSIS — N3001 Acute cystitis with hematuria: Secondary | ICD-10-CM | POA: Diagnosis not present

## 2017-03-14 DIAGNOSIS — K219 Gastro-esophageal reflux disease without esophagitis: Secondary | ICD-10-CM | POA: Diagnosis not present

## 2017-03-14 DIAGNOSIS — F329 Major depressive disorder, single episode, unspecified: Secondary | ICD-10-CM | POA: Diagnosis present

## 2017-03-14 DIAGNOSIS — E860 Dehydration: Secondary | ICD-10-CM | POA: Diagnosis present

## 2017-03-14 DIAGNOSIS — R509 Fever, unspecified: Secondary | ICD-10-CM | POA: Diagnosis not present

## 2017-03-14 DIAGNOSIS — Z7982 Long term (current) use of aspirin: Secondary | ICD-10-CM | POA: Diagnosis not present

## 2017-03-14 DIAGNOSIS — F0390 Unspecified dementia without behavioral disturbance: Secondary | ICD-10-CM | POA: Diagnosis present

## 2017-03-14 DIAGNOSIS — N390 Urinary tract infection, site not specified: Secondary | ICD-10-CM | POA: Diagnosis present

## 2017-03-14 DIAGNOSIS — I10 Essential (primary) hypertension: Secondary | ICD-10-CM | POA: Diagnosis present

## 2017-03-14 DIAGNOSIS — R69 Illness, unspecified: Secondary | ICD-10-CM | POA: Diagnosis not present

## 2017-03-14 DIAGNOSIS — Z79899 Other long term (current) drug therapy: Secondary | ICD-10-CM | POA: Diagnosis not present

## 2017-03-14 DIAGNOSIS — I959 Hypotension, unspecified: Secondary | ICD-10-CM | POA: Diagnosis present

## 2017-03-14 DIAGNOSIS — E785 Hyperlipidemia, unspecified: Secondary | ICD-10-CM | POA: Diagnosis present

## 2017-03-14 DIAGNOSIS — Z888 Allergy status to other drugs, medicaments and biological substances status: Secondary | ICD-10-CM | POA: Diagnosis not present

## 2017-03-14 DIAGNOSIS — Z886 Allergy status to analgesic agent status: Secondary | ICD-10-CM | POA: Diagnosis not present

## 2017-03-24 DIAGNOSIS — I10 Essential (primary) hypertension: Secondary | ICD-10-CM | POA: Diagnosis not present

## 2017-03-24 DIAGNOSIS — Z9181 History of falling: Secondary | ICD-10-CM | POA: Diagnosis not present

## 2017-03-24 DIAGNOSIS — D649 Anemia, unspecified: Secondary | ICD-10-CM | POA: Diagnosis not present

## 2017-03-24 DIAGNOSIS — F0390 Unspecified dementia without behavioral disturbance: Secondary | ICD-10-CM | POA: Diagnosis not present

## 2017-03-24 DIAGNOSIS — Z87440 Personal history of urinary (tract) infections: Secondary | ICD-10-CM | POA: Diagnosis not present

## 2017-03-24 DIAGNOSIS — I2510 Atherosclerotic heart disease of native coronary artery without angina pectoris: Secondary | ICD-10-CM | POA: Diagnosis not present

## 2017-03-24 DIAGNOSIS — R531 Weakness: Secondary | ICD-10-CM | POA: Diagnosis not present

## 2017-03-24 DIAGNOSIS — Z951 Presence of aortocoronary bypass graft: Secondary | ICD-10-CM | POA: Diagnosis not present

## 2017-03-24 DIAGNOSIS — E785 Hyperlipidemia, unspecified: Secondary | ICD-10-CM | POA: Diagnosis not present

## 2017-03-25 DIAGNOSIS — N130 Hydronephrosis with ureteropelvic junction obstruction: Secondary | ICD-10-CM | POA: Diagnosis not present

## 2017-03-25 DIAGNOSIS — N3000 Acute cystitis without hematuria: Secondary | ICD-10-CM | POA: Diagnosis not present

## 2017-03-25 DIAGNOSIS — N3021 Other chronic cystitis with hematuria: Secondary | ICD-10-CM | POA: Diagnosis not present

## 2017-03-26 DIAGNOSIS — E7800 Pure hypercholesterolemia, unspecified: Secondary | ICD-10-CM | POA: Diagnosis not present

## 2017-03-26 DIAGNOSIS — Z6822 Body mass index (BMI) 22.0-22.9, adult: Secondary | ICD-10-CM | POA: Diagnosis not present

## 2017-03-26 DIAGNOSIS — Z299 Encounter for prophylactic measures, unspecified: Secondary | ICD-10-CM | POA: Diagnosis not present

## 2017-03-26 DIAGNOSIS — N390 Urinary tract infection, site not specified: Secondary | ICD-10-CM | POA: Diagnosis not present

## 2017-03-26 DIAGNOSIS — Z09 Encounter for follow-up examination after completed treatment for conditions other than malignant neoplasm: Secondary | ICD-10-CM | POA: Diagnosis not present

## 2017-03-26 DIAGNOSIS — I2510 Atherosclerotic heart disease of native coronary artery without angina pectoris: Secondary | ICD-10-CM | POA: Diagnosis not present

## 2017-04-15 DIAGNOSIS — E7800 Pure hypercholesterolemia, unspecified: Secondary | ICD-10-CM | POA: Diagnosis not present

## 2017-04-15 DIAGNOSIS — I10 Essential (primary) hypertension: Secondary | ICD-10-CM | POA: Diagnosis not present

## 2017-04-15 DIAGNOSIS — F0390 Unspecified dementia without behavioral disturbance: Secondary | ICD-10-CM | POA: Diagnosis not present

## 2017-05-01 ENCOUNTER — Emergency Department (HOSPITAL_COMMUNITY)
Admission: EM | Admit: 2017-05-01 | Discharge: 2017-05-02 | Disposition: A | Discharge status: Home / Self Care | Payer: Medicare Other | Attending: Emergency Medicine | Admitting: Emergency Medicine

## 2017-05-01 ENCOUNTER — Encounter (HOSPITAL_COMMUNITY): Payer: Self-pay | Admitting: Emergency Medicine

## 2017-05-01 ENCOUNTER — Emergency Department (HOSPITAL_COMMUNITY): Payer: Medicare Other

## 2017-05-01 DIAGNOSIS — I10 Essential (primary) hypertension: Secondary | ICD-10-CM | POA: Diagnosis not present

## 2017-05-01 DIAGNOSIS — Z79899 Other long term (current) drug therapy: Secondary | ICD-10-CM | POA: Diagnosis not present

## 2017-05-01 DIAGNOSIS — R4182 Altered mental status, unspecified: Secondary | ICD-10-CM | POA: Insufficient documentation

## 2017-05-01 DIAGNOSIS — R410 Disorientation, unspecified: Secondary | ICD-10-CM | POA: Diagnosis not present

## 2017-05-01 DIAGNOSIS — Z7982 Long term (current) use of aspirin: Secondary | ICD-10-CM | POA: Diagnosis not present

## 2017-05-01 DIAGNOSIS — R6883 Chills (without fever): Secondary | ICD-10-CM | POA: Diagnosis not present

## 2017-05-01 DIAGNOSIS — Z951 Presence of aortocoronary bypass graft: Secondary | ICD-10-CM | POA: Insufficient documentation

## 2017-05-01 DIAGNOSIS — R300 Dysuria: Secondary | ICD-10-CM | POA: Diagnosis not present

## 2017-05-01 DIAGNOSIS — I2510 Atherosclerotic heart disease of native coronary artery without angina pectoris: Secondary | ICD-10-CM | POA: Insufficient documentation

## 2017-05-01 DIAGNOSIS — R509 Fever, unspecified: Secondary | ICD-10-CM | POA: Diagnosis not present

## 2017-05-01 LAB — CBC WITH DIFFERENTIAL/PLATELET
BASOS ABS: 0 10*3/uL (ref 0.0–0.1)
Basophils Relative: 0 %
Eosinophils Absolute: 0 10*3/uL (ref 0.0–0.7)
Eosinophils Relative: 0 %
HEMATOCRIT: 26.8 % — AB (ref 36.0–46.0)
HEMOGLOBIN: 8.8 g/dL — AB (ref 12.0–15.0)
LYMPHS PCT: 8 %
Lymphs Abs: 0.8 10*3/uL (ref 0.7–4.0)
MCH: 29.6 pg (ref 26.0–34.0)
MCHC: 32.8 g/dL (ref 30.0–36.0)
MCV: 90.2 fL (ref 78.0–100.0)
MONO ABS: 0.7 10*3/uL (ref 0.1–1.0)
Monocytes Relative: 7 %
NEUTROS ABS: 8 10*3/uL — AB (ref 1.7–7.7)
NEUTROS PCT: 85 %
Platelets: 227 10*3/uL (ref 150–400)
RBC: 2.97 MIL/uL — AB (ref 3.87–5.11)
RDW: 13.5 % (ref 11.5–15.5)
WBC: 9.4 10*3/uL (ref 4.0–10.5)

## 2017-05-01 LAB — PROTIME-INR
INR: 1.07
Prothrombin Time: 13.8 seconds (ref 11.4–15.2)

## 2017-05-01 LAB — COMPREHENSIVE METABOLIC PANEL
ALK PHOS: 55 U/L (ref 38–126)
ALT: 13 U/L — ABNORMAL LOW (ref 14–54)
ANION GAP: 9 (ref 5–15)
AST: 18 U/L (ref 15–41)
Albumin: 3.3 g/dL — ABNORMAL LOW (ref 3.5–5.0)
BUN: 31 mg/dL — ABNORMAL HIGH (ref 6–20)
CO2: 22 mmol/L (ref 22–32)
Calcium: 8.5 mg/dL — ABNORMAL LOW (ref 8.9–10.3)
Chloride: 101 mmol/L (ref 101–111)
Creatinine, Ser: 1.66 mg/dL — ABNORMAL HIGH (ref 0.44–1.00)
GFR calc Af Amer: 32 mL/min — ABNORMAL LOW (ref >60)
GFR, EST NON AFRICAN AMERICAN: 28 mL/min — AB (ref >60)
GLUCOSE: 182 mg/dL — AB (ref 65–99)
POTASSIUM: 4.1 mmol/L (ref 3.5–5.1)
Sodium: 132 mmol/L — ABNORMAL LOW (ref 135–145)
TOTAL PROTEIN: 6.7 g/dL (ref 6.5–8.1)
Total Bilirubin: 0.5 mg/dL (ref 0.3–1.2)

## 2017-05-01 LAB — LACTIC ACID, PLASMA
LACTIC ACID, VENOUS: 0.7 mmol/L (ref 0.5–1.9)
LACTIC ACID, VENOUS: 0.6 mmol/L (ref 0.5–1.9)

## 2017-05-01 LAB — URINALYSIS, ROUTINE W REFLEX MICROSCOPIC
BILIRUBIN URINE: NEGATIVE
Glucose, UA: NEGATIVE mg/dL
HGB URINE DIPSTICK: NEGATIVE
KETONES UR: NEGATIVE mg/dL
Nitrite: NEGATIVE
Protein, ur: 100 mg/dL — AB
Specific Gravity, Urine: 1.01 (ref 1.005–1.030)
pH: 6 (ref 5.0–8.0)

## 2017-05-01 MED ORDER — DEXTROSE 5 % IV SOLN
1.0000 g | Freq: Once | INTRAVENOUS | Status: AC
  Administered 2017-05-01: 1 g via INTRAVENOUS
  Filled 2017-05-01: qty 10

## 2017-05-01 MED ORDER — SODIUM CHLORIDE 0.9 % IV SOLN: INTRAVENOUS | Status: DC

## 2017-05-01 MED ORDER — SODIUM CHLORIDE 0.9 % IV BOLUS (SEPSIS)
1000.0000 mL | Freq: Once | INTRAVENOUS | Status: AC
  Administered 2017-05-01: 1000 mL via INTRAVENOUS

## 2017-05-01 NOTE — ED Triage Notes (Signed)
Pt's daughter reports pt has been acting not quite herself for the past few days.  Has experienced fever, chills, and dysuria for the past few days.  Hx of same.

## 2017-05-01 NOTE — ED Notes (Signed)
Pt notified urine needed, does not need to urinate at this time

## 2017-05-02 MED ORDER — CEPHALEXIN 500 MG PO CAPS: 500.0000 mg | ORAL_CAPSULE | Freq: Four times a day (QID) | ORAL | 0 refills | Status: DC

## 2017-05-02 NOTE — Discharge Instructions (Signed)
Workup without any specific findings.  Possible early urinary tract infection.  Urine culture sent.  Take the Keflex as directed.  Follow-up with your doctors.  Return for any new or worse symptoms.

## 2017-05-02 NOTE — ED Provider Notes (Signed)
[facility] EMERGENCY DEPARTMENT Provider Note   CSN: [id number] Arrival date & time: 05/01/17  1834     History   Chief Complaint Chief Complaint  Patient presents with  . Dysuria    HPI Gloria Rowland is a 84 y.o. female.  Patient brought in by family members for concerns for urinary tract infection.  She has had frequent urinary tract infection she is on low-dose Septra for recurrent urinary tract infections followed by [facility].  She started not diet quite herself for the past few days.  Family had noted a little bit of fever and chills and does have a complaint of dysuria.  Temp upon arrival here was 100.3.  Not hypotensive oxygen saturations on room air were 97%.  Not tachycardic and respiratory rate was not up.      Past Medical History:  Diagnosis Date  . Baker's cyst, ruptured   . CAD (coronary artery disease)   . Dementia   . History of hepatitis   . Hyperlipidemia   . Hypertension   . Lumbar degenerative disc disease   . Nephrolithiasis   . Spasmodic torticollis     Patient Active Problem List   Diagnosis Date Noted  . Abdominal pain, epigastric 12/24/2016  . Hiatal hernia 05/27/2011  . CAD (coronary artery disease)   . S/P CABG (coronary artery bypass graft)   . Hyperlipidemia   . Hypertension   . Nephrolithiasis   . Lumbar degenerative disc disease   . Dementia     Past Surgical History:  Procedure Laterality Date  . ABDOMINAL HYSTERECTOMY    . CARPAL TUNNEL RELEASE    . CHOLECYSTECTOMY    . CORONARY ARTERY BYPASS GRAFT  1998   LIMA to LAD-Dx  . ESOPHAGOGASTRODUODENOSCOPY N/A 01/15/2017   Procedure: ESOPHAGOGASTRODUODENOSCOPY (EGD);  Surgeon: Malissa Hippoehman, Najeeb U, MD;  [facility];  Service: Endoscopy;  Laterality: N/A;  1:00  . KIDNEY STONE SURGERY    . LEFT HEART CATHETERIZATION WITH CORONARY/GRAFT ANGIOGRAM N/A 05/26/2011   Procedure: LEFT HEART CATHETERIZATION WITH Isabel CapriceORONARY/GRAFT ANGIOGRAM;  Surgeon: Othella BoyerWilliam S Tilley, MD;   [facility] C[facility] LAB;  Service: Cardiovascular;  Laterality: N/A;  . LUMBAR LAMINECTOMY      OB History    No data available       Home Medications    Prior to Admission medications   Medication Sig Start Date End Date Taking? Authorizing Provider  acetaminophen (TYLENOL) 650 MG CR tablet Take 650 mg by mouth every 6 (six) hours as needed for pain.   Yes [provider]  amLODipine (NORVASC) 5 MG tablet Take 5 mg by mouth daily.   Yes [provider]  aspirin EC 81 MG tablet Take 81 mg by mouth daily.   Yes [provider]  citalopram (CELEXA) 10 MG tablet Take 10 mg by mouth daily.   Yes [provider]  GEMFIBROZIL PO Take 300 mg by mouth 2 (two) times daily.   Yes [provider]  lisinopril (PRINIVIL,ZESTRIL) 20 MG tablet Take 20 mg by mouth 2 (two) times daily.    Yes [provider]  meclizine (ANTIVERT) 12.5 MG tablet Take 12.5 mg by mouth as needed for dizziness.   Yes [provider]  memantine (NAMENDA) 10 MG tablet Take 10 mg by mouth 2 (two) times daily.   Yes [provider]  pantoprazole (PROTONIX) 40 MG tablet Take 20 mg by mouth 2 (two) times daily.    Yes [provider]  rosuvastatin (CRESTOR) 10 MG tablet Take 10 mg by mouth daily.   Yes [provider]  trimethoprim (TRIMPEX) 100 MG tablet Take 1 tablet by mouth daily. 04/26/17  Yes [provider]  Vitamin D, Ergocalciferol, (DRISDOL) 50000 units CAPS capsule Take 50,000 Units by mouth every 7 (seven) days.   Yes [provider]  cefUROXime (CEFTIN) 500 MG tablet Take 500 mg by mouth 2 (two) times daily with a meal.    [provider]  cephALEXin (KEFLEX) 500 MG capsule Take 1 capsule (500 mg total) by mouth 4 (four) times daily. 05/02/17   Vanetta Mulders, MD  Dextromethorphan-Guaifenesin (ROBITUSSIN COUGH+CHEST CONG DM) 5-100 MG/5ML LIQD Give 10 ml as needed for cough every 4 hours prn    [provider]  polyethylene glycol powder (GLYCOLAX/MIRALAX) powder Take 17 g by mouth daily as needed. For constipation. 12/04/16   [provider]    Family History Family History  Problem Relation Age of Onset  . Diabetes Sister   . Diabetes Sister     Social History Social History   Tobacco Use  . Smoking status: Never Smoker  . Smokeless tobacco: Never Used  Substance Use Topics  . Alcohol use: No  . Drug use: No     Allergies   Aspirin and Codeine   Review of Systems Review of Systems  Constitutional: Positive for chills and fever.  HENT: Negative for congestion.   Eyes: Negative for visual disturbance.  Respiratory: Negative for shortness of breath.   Cardiovascular: Negative for chest pain.  Gastrointestinal: Negative for abdominal pain, nausea and vomiting.  Genitourinary: Positive for dysuria.  Musculoskeletal: Negative for back pain.  Neurological: Negative for headaches.  Hematological: Does not bruise/bleed easily.  Psychiatric/Behavioral: Positive for confusion.     Physical Exam Updated Vital Signs BP (!) 153/65   Pulse (!) 55   Temp 100.3 F (37.9 C) (Oral)   Resp 17   Ht 1.6 m (5\' 3" )   Wt 54.4 kg (120 lb)   SpO2 100%   BMI 21.26 kg/m   Physical Exam  Constitutional: She appears well-developed and well-nourished. No distress.  HENT:  Head: Normocephalic and atraumatic.  Mouth/Throat: Oropharynx is clear and moist.  Eyes: Conjunctivae and EOM are normal. Pupils are equal, round, and reactive to light.  Neck: Neck supple.  Cardiovascular: Normal rate and regular rhythm.  Pulmonary/Chest: Effort normal and breath sounds normal.  Abdominal: Soft. Bowel sounds are normal. There is no tenderness.  Musculoskeletal: Normal range of motion. She exhibits no edema.  Neurological: She is alert. No cranial nerve deficit or sensory deficit. She exhibits normal muscle tone. Coordination normal.  Skin: Skin is warm.  Nursing note and  vitals reviewed.    ED Treatments / Results  Labs (all labs ordered are listed, but only abnormal results are displayed) Labs Reviewed  COMPREHENSIVE METABOLIC PANEL - Abnormal; Notable for the following components:      Result Value   Sodium 132 (*)    Glucose, Bld 182 (*)    BUN 31 (*)    Creatinine, Ser 1.66 (*)    Calcium 8.5 (*)    Albumin 3.3 (*)    ALT 13 (*)    GFR calc non Af Amer 28 (*)    GFR calc Af Amer 32 (*)    All other components within normal limits  CBC WITH DIFFERENTIAL/PLATELET - Abnormal; Notable for the following components:   RBC 2.97 (*)    Hemoglobin 8.8 (*)  HCT 26.8 (*)    Neutro Abs 8.0 (*)    All other components within normal limits  URINALYSIS, ROUTINE W REFLEX MICROSCOPIC - Abnormal; Notable for the following components:   APPearance TURBID (*)    Protein, ur 100 (*)    Leukocytes, UA LARGE (*)    Bacteria, UA RARE (*)    Squamous Epithelial / LPF 0-5 (*)    Non Squamous Epithelial 0-5 (*)    All other components within normal limits  CULTURE, BLOOD (ROUTINE X 2)  CULTURE, BLOOD (ROUTINE X 2)  URINE CULTURE  PROTIME-INR  LACTIC ACID, PLASMA  LACTIC ACID, PLASMA    EKG  EKG Interpretation None       Radiology Dg Chest 2 View  Result Date: 05/01/2017 CLINICAL DATA:  RECURRENT UTI, HISTORY OF SEPSISPt's daughter reports pt has been acting not quite herself for the past few days. Has experienced fever, chills, and dysuria for the past few days. Hx of same. EXAM: CHEST  2 VIEW COMPARISON:  03/14/2017 FINDINGS: Stable changes from previous cardiac surgery. Cardiac silhouette is normal in size. No mediastinal or hilar masses. No evidence of adenopathy. Lungs are clear. No pleural effusion or pneumothorax. Skeletal structures are demineralized but intact. IMPRESSION: No acute cardiopulmonary disease. Electronically Signed   By: Amie Portland M.D.   On: 05/01/2017 19:23   Ct Head Wo Contrast  Result Date: 05/01/2017 CLINICAL DATA:  Acute  onset of fever, chills and dysuria. Altered mental status. EXAM: CT HEAD WITHOUT CONTRAST TECHNIQUE: Contiguous axial images were obtained from the base of the skull through the vertex without intravenous contrast. COMPARISON:  CT of the head performed 06/19/2010, and MRI of the brain performed 10/12/2014 FINDINGS: Brain: No evidence of acute infarction, hemorrhage, hydrocephalus, extra-axial collection or mass lesion / mass effect. Prominence of the ventricles and sulci reflects mild to moderate cortical volume loss. Mild cerebellar atrophy is noted. The brainstem and fourth ventricle are within normal limits. The basal ganglia are unremarkable in appearance. The cerebral hemispheres demonstrate grossly normal gray-white differentiation. No mass effect or midline shift is seen. Vascular: No hyperdense vessel or unexpected calcification. Skull: There is no evidence of fracture; visualized osseous structures are unremarkable in appearance. Sinuses/Orbits: The orbits are within normal limits. The paranasal sinuses and mastoid air cells are well-aerated. Other: No significant soft tissue abnormalities are seen. IMPRESSION: 1. No acute intracranial pathology seen on CT. 2. Mild to moderate cortical volume loss noted. Electronically Signed   By: Roanna Raider M.D.   On: 05/01/2017 23:47    Procedures Procedures (including critical care time)  Medications Ordered in ED Medications  0.9 %  sodium chloride infusion (not administered)  sodium chloride 0.9 % bolus 1,000 mL (1,000 mLs Intravenous New Bag/Given 05/01/17 2150)  cefTRIAXone (ROCEPHIN) 1 g in dextrose 5 % 50 mL IVPB (1 g Intravenous New Bag/Given 05/01/17 2354)     Initial Impression / Assessment and Plan / ED Course  I have reviewed the triage vital signs and the nursing notes.  Pertinent labs & imaging results that were available during my care of the patient were reviewed by me and considered in my medical decision making (see chart for  details).   Patient brought in by family members.  Patient not thinking as clearly as she usually has an family members are concerned that she has an infection.  Reporting fever chills and dysuria for the past few days.  Temp on arrival here was 100.3.  Not hypotensive oxygen  saturations were good.  Patient is on antibiotics prophylactically prevent recurrent urinary tract infections so she is on low dose Septra.  Is followed by [facility].  Extensive workup without any acute findings.  Urinalysis certainly not consistent with urinary tract infection but urine culture sent.  But due to her symptoms and her past history and no other findings patient did receive a dose of ceftriaxone and will be put on Keflex.  Urine culture pending.  Workup otherwise without any acute findings.  Chest x-ray negative for pneumonia.  Head CT without any acute abnormality patient did have evidence of some renal insufficiency and some anemia with a.  But nothing requiring blood transfusion lactic acids were negative.  No evidence of any serious infection symptoms not really consistent with flu.  Family very responsible and they will return for any new or worse symptoms.  Quite alert and there is really no significant mental status change.  He gets it subtle according to family.  Final Clinical Impressions(s) / ED Diagnoses   Final diagnoses:  Altered mental status, unspecified altered mental status type    ED Discharge Orders        Ordered    cephALEXin (KEFLEX) 500 MG capsule  4 times daily     05/02/17 0032       Vanetta Mulders, MD 05/03/17 [phone]

## 2017-05-04 LAB — URINE CULTURE

## 2017-05-05 ENCOUNTER — Telehealth: Payer: Self-pay | Admitting: Emergency Medicine

## 2017-05-05 DIAGNOSIS — E7800 Pure hypercholesterolemia, unspecified: Secondary | ICD-10-CM | POA: Diagnosis not present

## 2017-05-05 DIAGNOSIS — I10 Essential (primary) hypertension: Secondary | ICD-10-CM | POA: Diagnosis not present

## 2017-05-05 DIAGNOSIS — F0390 Unspecified dementia without behavioral disturbance: Secondary | ICD-10-CM | POA: Diagnosis not present

## 2017-05-05 NOTE — Telephone Encounter (Signed)
Post ED Visit - Positive Culture Follow-up  Culture report reviewed by antimicrobial stewardship pharmacist:  []  Enzo BiNathan Batchelder, Pharm.D. []  Celedonio MiyamotoJeremy Frens, Pharm.D., BCPS AQ-ID []  Garvin FilaMike Maccia, Pharm.D., BCPS [x]  Georgina PillionElizabeth Martin, [address]Ph[address] BCPS []  ClemonsMinh Pham, [address]Pharm[address]D., BCPS, AAHIVP []  Estella HuskMichelle Turner, Pharm.D., BCPS, AAHIVP []  Lysle Pearlachel Rumbarger, PharmD, BCPS []  Blake DivineShannon Parkey, PharmD []  Pollyann SamplesAndy Johnston, PharmD, BCPS  Positive urine culture Treated with cephalexin and trimethoprim, organism sensitive to the same and no further patient follow-up is required at this time.  Berle MullMiller, Leshay Desaulniers 05/05/2017, 11:02 AM

## 2017-05-06 LAB — CULTURE, BLOOD (ROUTINE X 2)
CULTURE: NO GROWTH
Culture: NO GROWTH
SPECIAL REQUESTS: ADEQUATE
Special Requests: ADEQUATE

## 2017-05-13 DIAGNOSIS — Z299 Encounter for prophylactic measures, unspecified: Secondary | ICD-10-CM | POA: Diagnosis not present

## 2017-05-13 DIAGNOSIS — Z6821 Body mass index (BMI) 21.0-21.9, adult: Secondary | ICD-10-CM | POA: Diagnosis not present

## 2017-05-13 DIAGNOSIS — N390 Urinary tract infection, site not specified: Secondary | ICD-10-CM | POA: Diagnosis not present

## 2017-05-13 DIAGNOSIS — Z713 Dietary counseling and surveillance: Secondary | ICD-10-CM | POA: Diagnosis not present

## 2017-05-18 DIAGNOSIS — N3021 Other chronic cystitis with hematuria: Secondary | ICD-10-CM | POA: Diagnosis not present

## 2017-05-18 DIAGNOSIS — N130 Hydronephrosis with ureteropelvic junction obstruction: Secondary | ICD-10-CM | POA: Diagnosis not present

## 2017-06-01 DIAGNOSIS — F0390 Unspecified dementia without behavioral disturbance: Secondary | ICD-10-CM | POA: Diagnosis not present

## 2017-06-01 DIAGNOSIS — I10 Essential (primary) hypertension: Secondary | ICD-10-CM | POA: Diagnosis not present

## 2017-06-01 DIAGNOSIS — E7800 Pure hypercholesterolemia, unspecified: Secondary | ICD-10-CM | POA: Diagnosis not present

## 2017-07-21 DIAGNOSIS — N3021 Other chronic cystitis with hematuria: Secondary | ICD-10-CM | POA: Diagnosis not present

## 2017-07-21 DIAGNOSIS — N130 Hydronephrosis with ureteropelvic junction obstruction: Secondary | ICD-10-CM | POA: Diagnosis not present

## 2017-08-10 DIAGNOSIS — I2510 Atherosclerotic heart disease of native coronary artery without angina pectoris: Secondary | ICD-10-CM | POA: Diagnosis not present

## 2017-08-10 DIAGNOSIS — I10 Essential (primary) hypertension: Secondary | ICD-10-CM | POA: Diagnosis not present

## 2017-08-10 DIAGNOSIS — E785 Hyperlipidemia, unspecified: Secondary | ICD-10-CM | POA: Diagnosis not present

## 2017-08-13 DIAGNOSIS — F0390 Unspecified dementia without behavioral disturbance: Secondary | ICD-10-CM | POA: Diagnosis not present

## 2017-08-13 DIAGNOSIS — I10 Essential (primary) hypertension: Secondary | ICD-10-CM | POA: Diagnosis not present

## 2017-08-13 DIAGNOSIS — E7800 Pure hypercholesterolemia, unspecified: Secondary | ICD-10-CM | POA: Diagnosis not present

## 2017-08-20 ENCOUNTER — Other Ambulatory Visit: Payer: Self-pay

## 2017-08-20 ENCOUNTER — Emergency Department (HOSPITAL_COMMUNITY)
Admission: EM | Admit: 2017-08-20 | Discharge: 2017-08-20 | Disposition: A | Discharge status: Home / Self Care | Payer: Medicare Other | Attending: Emergency Medicine | Admitting: Emergency Medicine

## 2017-08-20 ENCOUNTER — Encounter (HOSPITAL_COMMUNITY): Payer: Self-pay

## 2017-08-20 ENCOUNTER — Emergency Department (HOSPITAL_COMMUNITY): Payer: Medicare Other

## 2017-08-20 DIAGNOSIS — I2510 Atherosclerotic heart disease of native coronary artery without angina pectoris: Secondary | ICD-10-CM | POA: Insufficient documentation

## 2017-08-20 DIAGNOSIS — R300 Dysuria: Secondary | ICD-10-CM | POA: Insufficient documentation

## 2017-08-20 DIAGNOSIS — R1031 Right lower quadrant pain: Secondary | ICD-10-CM | POA: Insufficient documentation

## 2017-08-20 DIAGNOSIS — N1339 Other hydronephrosis: Secondary | ICD-10-CM | POA: Diagnosis not present

## 2017-08-20 DIAGNOSIS — Z7982 Long term (current) use of aspirin: Secondary | ICD-10-CM | POA: Insufficient documentation

## 2017-08-20 DIAGNOSIS — F0390 Unspecified dementia without behavioral disturbance: Secondary | ICD-10-CM | POA: Insufficient documentation

## 2017-08-20 DIAGNOSIS — Z79899 Other long term (current) drug therapy: Secondary | ICD-10-CM | POA: Diagnosis not present

## 2017-08-20 DIAGNOSIS — I10 Essential (primary) hypertension: Secondary | ICD-10-CM | POA: Diagnosis not present

## 2017-08-20 DIAGNOSIS — R531 Weakness: Secondary | ICD-10-CM | POA: Diagnosis not present

## 2017-08-20 LAB — URINALYSIS, ROUTINE W REFLEX MICROSCOPIC
Bilirubin Urine: NEGATIVE
GLUCOSE, UA: NEGATIVE mg/dL
Hgb urine dipstick: NEGATIVE
KETONES UR: NEGATIVE mg/dL
LEUKOCYTES UA: NEGATIVE
Nitrite: NEGATIVE
PH: 8 (ref 5.0–8.0)
Protein, ur: NEGATIVE mg/dL
SPECIFIC GRAVITY, URINE: 1.005 (ref 1.005–1.030)

## 2017-08-20 LAB — CBC WITH DIFFERENTIAL/PLATELET
BASOS ABS: 0 10*3/uL (ref 0.0–0.1)
Basophils Relative: 0 %
Eosinophils Absolute: 0.1 10*3/uL (ref 0.0–0.7)
Eosinophils Relative: 2 %
HEMATOCRIT: 30.3 % — AB (ref 36.0–46.0)
HEMOGLOBIN: 10.2 g/dL — AB (ref 12.0–15.0)
LYMPHS PCT: 21 %
Lymphs Abs: 1 10*3/uL (ref 0.7–4.0)
MCH: 29.9 pg (ref 26.0–34.0)
MCHC: 33.7 g/dL (ref 30.0–36.0)
MCV: 88.9 fL (ref 78.0–100.0)
MONO ABS: 0.4 10*3/uL (ref 0.1–1.0)
MONOS PCT: 9 %
NEUTROS ABS: 3.1 10*3/uL (ref 1.7–7.7)
Neutrophils Relative %: 68 %
Platelets: 210 10*3/uL (ref 150–400)
RBC: 3.41 MIL/uL — ABNORMAL LOW (ref 3.87–5.11)
RDW: 12.9 % (ref 11.5–15.5)
WBC: 4.6 10*3/uL (ref 4.0–10.5)

## 2017-08-20 LAB — BASIC METABOLIC PANEL
ANION GAP: 8 (ref 5–15)
BUN: 17 mg/dL (ref 6–20)
CALCIUM: 9.1 mg/dL (ref 8.9–10.3)
CHLORIDE: 103 mmol/L (ref 101–111)
CO2: 24 mmol/L (ref 22–32)
Creatinine, Ser: 1.02 mg/dL — ABNORMAL HIGH (ref 0.44–1.00)
GFR calc Af Amer: 57 mL/min — ABNORMAL LOW (ref >60)
GFR calc non Af Amer: 49 mL/min — ABNORMAL LOW (ref >60)
GLUCOSE: 133 mg/dL — AB (ref 65–99)
Potassium: 3.7 mmol/L (ref 3.5–5.1)
Sodium: 135 mmol/L (ref 135–145)

## 2017-08-20 MED ORDER — FENTANYL CITRATE (PF) 100 MCG/2ML IJ SOLN
50.0000 ug | Freq: Once | INTRAMUSCULAR | Status: AC
  Administered 2017-08-20: 50 ug via INTRAVENOUS
  Filled 2017-08-20: qty 2

## 2017-08-20 MED ORDER — IOHEXOL 300 MG/ML  SOLN
75.0000 mL | Freq: Once | INTRAMUSCULAR | Status: AC | PRN
  Administered 2017-08-20: 75 mL via INTRAVENOUS

## 2017-08-20 MED ORDER — HYDROCODONE-ACETAMINOPHEN 5-325 MG PO TABS: 1.0000 | ORAL_TABLET | Freq: Four times a day (QID) | ORAL | Status: DC | PRN

## 2017-08-20 MED ORDER — ONDANSETRON 4 MG PO TBDP: 4.0000 mg | ORAL_TABLET | Freq: Three times a day (TID) | ORAL | 0 refills | Status: DC | PRN

## 2017-08-20 MED ORDER — HYDROCODONE-ACETAMINOPHEN 5-325 MG PO TABS: 1.0000 | ORAL_TABLET | Freq: Four times a day (QID) | ORAL | 0 refills | Status: DC | PRN

## 2017-08-20 NOTE — Discharge Instructions (Addendum)
You were seen today for right-sided abdominal pain and urinary symptoms.  Your CT scan does not have an obvious source although there is some suggestion that you may have a stone that is not visible.  Given your symptoms, you will be treated for a kidney stone.  Take medications as prescribed.  Follow-up with your urologist.  If you develop worsening pain, fevers, intractable vomiting, you need to be reevaluated immediately.

## 2017-08-20 NOTE — ED Notes (Signed)
Water provided to pt.

## 2017-08-20 NOTE — ED Provider Notes (Signed)
[facility] EMERGENCY DEPARTMENT Provider Note   CSN: [id number] Arrival date & time: 08/20/17  0048     History   Chief Complaint Chief Complaint  Patient presents with  . Abdominal Pain    HPI Gloria Rowland is a 84 y.o. female.  HPI  This is an 84 year old female with history of coronary artery disease, hypertension, hyperlipidemia, kidney stones, recurrent UTI who presents with abdominal pain.  Patient reports 2 to 3-day history of worsening abdominal pain.  She states that sometimes the pain is all over but she has recurrent right charted sharp pain that is nonradiating.  It comes and goes.  It is not made better or worse by anything.  She does have a history of kidney stones but is unsure if this is similar to prior.  She reports dysuria.  No noted hematuria.  She denies any fevers.  Currently she rates her pain at 4 out of 10.  She took Tylenol at home with minimal relief.  She denies nausea, vomiting, diarrhea.  She denies chest pain or shortness of breath.  Past Medical History:  Diagnosis Date  . Baker's cyst, ruptured   . CAD (coronary artery disease)   . Dementia   . History of hepatitis   . Hyperlipidemia   . Hypertension   . Lumbar degenerative disc disease   . Nephrolithiasis   . Spasmodic torticollis     Patient Active Problem List   Diagnosis Date Noted  . Abdominal pain, epigastric 12/24/2016  . Hiatal hernia 05/27/2011  . CAD (coronary artery disease)   . S/P CABG (coronary artery bypass graft)   . Hyperlipidemia   . Hypertension   . Nephrolithiasis   . Lumbar degenerative disc disease   . Dementia     Past Surgical History:  Procedure Laterality Date  . ABDOMINAL HYSTERECTOMY    . CARPAL TUNNEL RELEASE    . CHOLECYSTECTOMY    . CORONARY ARTERY BYPASS GRAFT  1998   LIMA to LAD-Dx  . ESOPHAGOGASTRODUODENOSCOPY N/A 01/15/2017   Procedure: ESOPHAGOGASTRODUODENOSCOPY (EGD);  Surgeon: Malissa Hippo, MD;  Location: [facility] ENDO SUITE;  Service:  Endoscopy;  Laterality: N/A;  1:00  . KIDNEY STONE SURGERY    . LEFT HEART CATHETERIZATION WITH CORONARY/GRAFT ANGIOGRAM N/A 05/26/2011   Procedure: LEFT HEART CATHETERIZATION WITH Isabel Caprice;  Surgeon: Othella Boyer, MD;  Location: [facility] CATH LAB;  Service: Cardiovascular;  Laterality: N/A;  . LUMBAR LAMINECTOMY       OB History   None      Home Medications    Prior to Admission medications   Medication Sig Start Date End Date Taking? Authorizing Provider  acetaminophen (TYLENOL) 650 MG CR tablet Take 650 mg by mouth every 6 (six) hours as needed for pain.    [provider]  amLODipine (NORVASC) 5 MG tablet Take 5 mg by mouth daily.    [provider]  aspirin EC 81 MG tablet Take 81 mg by mouth daily.    [provider]  cefUROXime (CEFTIN) 500 MG tablet Take 500 mg by mouth 2 (two) times daily with a meal.    [provider]  cephALEXin (KEFLEX) 500 MG capsule Take 1 capsule (500 mg total) by mouth 4 (four) times daily. 05/02/17   Vanetta Mulders, MD  citalopram (CELEXA) 10 MG tablet Take 10 mg by mouth daily.    [provider]  Dextromethorphan-Guaifenesin (ROBITUSSIN COUGH+CHEST CONG DM) 5-100 MG/5ML LIQD Give 10 ml as needed for cough  every 4 hours prn    [provider]  GEMFIBROZIL PO Take 300 mg by mouth 2 (two) times daily.    [provider]  HYDROcodone-acetaminophen (NORCO/VICODIN) 5-325 MG tablet Take 1 tablet by mouth every 6 (six) hours as needed. 08/20/17   Galit Urich, Mayer Masker, MD  lisinopril (PRINIVIL,ZESTRIL) 20 MG tablet Take 20 mg by mouth 2 (two) times daily.     [provider]  meclizine (ANTIVERT) 12.5 MG tablet Take 12.5 mg by mouth as needed for dizziness.    [provider]  memantine (NAMENDA) 10 MG tablet Take 10 mg by mouth 2 (two) times daily.    [provider]  ondansetron (ZOFRAN ODT) 4 MG disintegrating tablet Take 1 tablet (4 mg total) by mouth every  8 (eight) hours as needed for nausea or vomiting. 08/20/17   Biagio Snelson, Mayer Masker, MD  pantoprazole (PROTONIX) 40 MG tablet Take 20 mg by mouth 2 (two) times daily.     [provider]  polyethylene glycol powder (GLYCOLAX/MIRALAX) powder Take 17 g by mouth daily as needed. For constipation. 12/04/16   [provider]  rosuvastatin (CRESTOR) 10 MG tablet Take 10 mg by mouth daily.    [provider]  trimethoprim (TRIMPEX) 100 MG tablet Take 1 tablet by mouth daily. 04/26/17   [provider]  Vitamin D, Ergocalciferol, (DRISDOL) 50000 units CAPS capsule Take 50,000 Units by mouth every 7 (seven) days.    [provider]    Family History Family History  Problem Relation Age of Onset  . Diabetes Sister   . Diabetes Sister     Social History Social History   Tobacco Use  . Smoking status: Never Smoker  . Smokeless tobacco: Never Used  Substance Use Topics  . Alcohol use: No  . Drug use: No     Allergies   Aspirin and Codeine   Review of Systems Review of Systems  Constitutional: Negative for fever.  Respiratory: Negative for shortness of breath.   Cardiovascular: Negative for chest pain.  Gastrointestinal: Positive for abdominal pain. Negative for diarrhea, nausea and vomiting.  Genitourinary: Positive for dysuria. Negative for hematuria.  Musculoskeletal: Positive for back pain.  Skin: Negative for rash.  All other systems reviewed and are negative.    Physical Exam Updated Vital Signs BP 135/75   Pulse (!) 56   Temp 97.7 F (36.5 C) (Oral)   Resp 18   Ht  (1.499 m)   Wt 54.4 kg (120 lb)   SpO2 98%   BMI 24.24 kg/m   Physical Exam  Constitutional: She is oriented to person, place, and time. She appears well-developed and well-nourished. No distress.  HENT:  Head: Normocephalic and atraumatic.  Eyes: Pupils are equal, round, and reactive to light.  Neck: Neck supple.  Cardiovascular: Normal rate, regular rhythm  and normal heart sounds.  Pulmonary/Chest: Effort normal. No respiratory distress. She has no wheezes.  Abdominal: Soft. Bowel sounds are normal. There is tenderness in the right lower quadrant. There is no CVA tenderness.  Right lower quadrant tenderness to palpation, no rebound or guarding, no CVA tenderness  Neurological: She is alert and oriented to person, place, and time.  Skin: Skin is warm and dry.  Psychiatric: She has a normal mood and affect.  Nursing note and vitals reviewed.    ED Treatments / Results  Labs (all labs ordered are listed, but only abnormal results are displayed) Labs Reviewed  CBC WITH DIFFERENTIAL/PLATELET - Abnormal;  Notable for the following components:      Result Value   RBC 3.41 (*)    Hemoglobin 10.2 (*)    HCT 30.3 (*)    All other components within normal limits  BASIC METABOLIC PANEL - Abnormal; Notable for the following components:   Glucose, Bld 133 (*)    Creatinine, Ser 1.02 (*)    GFR calc non Af Amer 49 (*)    GFR calc Af Amer 57 (*)    All other components within normal limits  URINALYSIS, ROUTINE W REFLEX MICROSCOPIC - Abnormal; Notable for the following components:   Color, Urine COLORLESS (*)    All other components within normal limits  URINE CULTURE    EKG None  Radiology Ct Abdomen Pelvis W Contrast  Result Date: 08/20/2017 CLINICAL DATA:  Acute onset of generalized abdominal pain and lower back pain. Increased urinary frequency. EXAM: CT ABDOMEN AND PELVIS WITH CONTRAST TECHNIQUE: Multidetector CT imaging of the abdomen and pelvis was performed using the standard protocol following bolus administration of intravenous contrast. CONTRAST:  75mL OMNIPAQUE IOHEXOL 300 MG/ML  SOLN COMPARISON:  CT of the abdomen and pelvis performed 01/04/2017 FINDINGS: Lower chest: The visualized lung bases are clear. The visualized portions of the mediastinum are unremarkable. Hepatobiliary: The liver is grossly unremarkable in appearance, with  minimal peripheral calcification. The patient is status post cholecystectomy, with clips noted along the gallbladder fossa. Prominence of the common bile duct and intrahepatic biliary ducts reflects prior cholecystectomy. Pancreas: A 2.2 cm cystic structure is noted at the head of the pancreas; the head of the pancreas projects upward. The remainder of the pancreas is unremarkable in appearance. Spleen: The spleen is unremarkable in appearance. Adrenals/Urinary Tract: The adrenal glands are unremarkable in appearance. There is severe chronic left-sided hydronephrosis and mild right-sided hydronephrosis. Left-sided hydronephrosis appears to reflect a stricture at the left ureteropelvic junction. No distal obstructing stones are identified. However, the right ureter is not well characterized. Stomach/Bowel: The stomach is unremarkable in appearance. The small bowel is within normal limits. The appendix is not visualized; there is no evidence for appendicitis. The colon is unremarkable in appearance. Vascular/Lymphatic: Diffuse calcification is seen along the abdominal aorta and its branches. The abdominal aorta is otherwise grossly unremarkable. The inferior vena cava is grossly unremarkable. No retroperitoneal lymphadenopathy is seen. No pelvic sidewall lymphadenopathy is identified. Reproductive: The bladder is significantly distended and grossly unremarkable. The patient is status post hysterectomy. No suspicious adnexal masses are seen. Other: No additional soft tissue abnormalities are seen. Musculoskeletal: No acute osseous abnormalities are identified. There is chronic atrophy of portions of the right gluteus and quadriceps musculature. IMPRESSION: 1. Severe chronic left-sided hydronephrosis and mild right-sided hydronephrosis. Left-sided hydronephrosis appears to reflect a stricture at the left ureteropelvic junction. An obstructing right ureteral stone cannot be excluded, as the right ureter is not well  characterized. Would correlate with the patient's symptoms. 2. 2.2 cm cystic structure noted at the head of the pancreas, relatively similar to 2018. Recommend follow up pre and post contrast MRI/MRCP or pancreatic protocol CT in 2 years. This recommendation follows ACR consensus guidelines: Management of Incidental Pancreatic Cysts: A White Paper of the ACR Incidental Findings Committee. J Am Coll Radiol 2017;14:911-923. 3. Chronic atrophy of portions of the right gluteus and quadriceps musculature. Aortic Atherosclerosis (ICD10-I70.0). Electronically Signed   By: Roanna Raider M.D.   On: 08/20/2017 03:03    Procedures Procedures (including critical care time)  Medications Ordered in ED Medications  HYDROcodone-acetaminophen (NORCO/VICODIN) 5-325 MG per tablet 1 tablet (has no administration in time range)  iohexol (OMNIPAQUE) 300 MG/ML solution 75 mL (75 mLs Intravenous Contrast Given 08/20/17 0232)  fentaNYL (SUBLIMAZE) injection 50 mcg (50 mcg Intravenous Given 08/20/17 0343)     Initial Impression / Assessment and Plan / ED Course  I have reviewed the triage vital signs and the nursing notes.  Pertinent labs & imaging results that were available during my care of the patient were reviewed by me and considered in my medical decision making (see chart for details).     Patient presents with abdominal pain and urinary symptoms.  She is overall nontoxic on exam.  Initial blood pressure elevated at 200 systolic.  She does have some reproducible tenderness on exam.  Considerations include pyelonephritis, appendicitis, kidney stones.  Patient was given pain and nausea medication.  Lab work obtained and largely reassuring.  Urinalysis does not show an obvious UTI.  CT scan obtained to better differentiate.  CT scan shows chronic left-sided hydronephrosis.  There is some right-sided hydronephrosis.  Ureter was not well visualized.  Question possible kidney stone.  This would correlate with the  patient's symptoms.  Given no other obvious source of symptoms, will treat empirically for a stone.  I discussed this with the patient and her daughters.  She does have a urologist.  Will discharge home with pain and nausea medication.  Follow-up closely with urology.  After history, exam, and medical workup I feel the patient has been appropriately medically screened and is safe for discharge home. Pertinent diagnoses were discussed with the patient. Patient was given return precautions.   Final Clinical Impressions(s) / ED Diagnoses   Final diagnoses:  Right lower quadrant pain    ED Discharge Orders        Ordered    ondansetron (ZOFRAN ODT) 4 MG disintegrating tablet  Every 8 hours PRN     08/20/17 0419    HYDROcodone-acetaminophen (NORCO/VICODIN) 5-325 MG tablet  Every 6 hours PRN     08/20/17 0422       Shon Baton, MD 08/20/17 0425

## 2017-08-20 NOTE — ED Triage Notes (Signed)
Pt reports abd pain and lower back pain for several days, states she has been urinating frequently.

## 2017-08-21 LAB — URINE CULTURE: CULTURE: NO GROWTH

## 2017-09-29 DIAGNOSIS — E7800 Pure hypercholesterolemia, unspecified: Secondary | ICD-10-CM | POA: Diagnosis not present

## 2017-09-29 DIAGNOSIS — I10 Essential (primary) hypertension: Secondary | ICD-10-CM | POA: Diagnosis not present

## 2017-09-29 DIAGNOSIS — F0390 Unspecified dementia without behavioral disturbance: Secondary | ICD-10-CM | POA: Diagnosis not present

## 2017-10-28 DIAGNOSIS — R109 Unspecified abdominal pain: Secondary | ICD-10-CM | POA: Diagnosis not present

## 2017-10-28 DIAGNOSIS — Z79899 Other long term (current) drug therapy: Secondary | ICD-10-CM | POA: Diagnosis not present

## 2017-10-28 DIAGNOSIS — Z299 Encounter for prophylactic measures, unspecified: Secondary | ICD-10-CM | POA: Diagnosis not present

## 2017-10-28 DIAGNOSIS — Z6820 Body mass index (BMI) 20.0-20.9, adult: Secondary | ICD-10-CM | POA: Diagnosis not present

## 2017-10-28 DIAGNOSIS — R1013 Epigastric pain: Secondary | ICD-10-CM | POA: Diagnosis not present

## 2017-10-28 DIAGNOSIS — F0390 Unspecified dementia without behavioral disturbance: Secondary | ICD-10-CM | POA: Diagnosis not present

## 2017-10-28 DIAGNOSIS — Z789 Other specified health status: Secondary | ICD-10-CM | POA: Diagnosis not present

## 2017-10-28 DIAGNOSIS — I10 Essential (primary) hypertension: Secondary | ICD-10-CM | POA: Diagnosis not present

## 2017-11-05 DIAGNOSIS — E7800 Pure hypercholesterolemia, unspecified: Secondary | ICD-10-CM | POA: Diagnosis not present

## 2017-11-05 DIAGNOSIS — I10 Essential (primary) hypertension: Secondary | ICD-10-CM | POA: Diagnosis not present

## 2017-11-05 DIAGNOSIS — F0390 Unspecified dementia without behavioral disturbance: Secondary | ICD-10-CM | POA: Diagnosis not present

## 2017-11-24 DIAGNOSIS — H25811 Combined forms of age-related cataract, right eye: Secondary | ICD-10-CM | POA: Diagnosis not present

## 2017-11-24 DIAGNOSIS — Z961 Presence of intraocular lens: Secondary | ICD-10-CM | POA: Diagnosis not present

## 2017-11-24 DIAGNOSIS — H04123 Dry eye syndrome of bilateral lacrimal glands: Secondary | ICD-10-CM | POA: Diagnosis not present

## 2017-12-07 ENCOUNTER — Other Ambulatory Visit: Payer: Self-pay

## 2017-12-07 ENCOUNTER — Emergency Department (HOSPITAL_COMMUNITY): Payer: Medicare Other

## 2017-12-07 ENCOUNTER — Encounter (HOSPITAL_COMMUNITY): Payer: Self-pay | Admitting: *Deleted

## 2017-12-07 ENCOUNTER — Emergency Department (HOSPITAL_COMMUNITY)
Admission: EM | Admit: 2017-12-07 | Discharge: 2017-12-07 | Disposition: A | Discharge status: Home / Self Care | Payer: Medicare Other | Attending: Emergency Medicine | Admitting: Emergency Medicine

## 2017-12-07 DIAGNOSIS — I2510 Atherosclerotic heart disease of native coronary artery without angina pectoris: Secondary | ICD-10-CM | POA: Diagnosis not present

## 2017-12-07 DIAGNOSIS — R112 Nausea with vomiting, unspecified: Secondary | ICD-10-CM | POA: Diagnosis not present

## 2017-12-07 DIAGNOSIS — R110 Nausea: Secondary | ICD-10-CM | POA: Diagnosis not present

## 2017-12-07 DIAGNOSIS — R109 Unspecified abdominal pain: Secondary | ICD-10-CM

## 2017-12-07 DIAGNOSIS — Z951 Presence of aortocoronary bypass graft: Secondary | ICD-10-CM | POA: Insufficient documentation

## 2017-12-07 DIAGNOSIS — F0390 Unspecified dementia without behavioral disturbance: Secondary | ICD-10-CM | POA: Diagnosis not present

## 2017-12-07 DIAGNOSIS — R1111 Vomiting without nausea: Secondary | ICD-10-CM | POA: Diagnosis not present

## 2017-12-07 DIAGNOSIS — K5900 Constipation, unspecified: Secondary | ICD-10-CM | POA: Diagnosis not present

## 2017-12-07 DIAGNOSIS — N1330 Unspecified hydronephrosis: Secondary | ICD-10-CM | POA: Diagnosis not present

## 2017-12-07 DIAGNOSIS — I10 Essential (primary) hypertension: Secondary | ICD-10-CM | POA: Diagnosis not present

## 2017-12-07 DIAGNOSIS — Z7982 Long term (current) use of aspirin: Secondary | ICD-10-CM | POA: Diagnosis not present

## 2017-12-07 LAB — COMPREHENSIVE METABOLIC PANEL
ALBUMIN: 4.2 g/dL (ref 3.5–5.0)
ALT: 11 U/L (ref 0–44)
AST: 21 U/L (ref 15–41)
Alkaline Phosphatase: 62 U/L (ref 38–126)
Anion gap: 12 (ref 5–15)
BUN: 19 mg/dL (ref 8–23)
CHLORIDE: 107 mmol/L (ref 98–111)
CO2: 22 mmol/L (ref 22–32)
Calcium: 9.5 mg/dL (ref 8.9–10.3)
Creatinine, Ser: 1.08 mg/dL — ABNORMAL HIGH (ref 0.44–1.00)
GFR calc Af Amer: 53 mL/min — ABNORMAL LOW (ref >60)
GFR, EST NON AFRICAN AMERICAN: 46 mL/min — AB (ref >60)
Glucose, Bld: 182 mg/dL — ABNORMAL HIGH (ref 70–99)
POTASSIUM: 3.2 mmol/L — AB (ref 3.5–5.1)
SODIUM: 141 mmol/L (ref 135–145)
Total Bilirubin: 0.5 mg/dL (ref 0.3–1.2)
Total Protein: 6.9 g/dL (ref 6.5–8.1)

## 2017-12-07 LAB — URINALYSIS, ROUTINE W REFLEX MICROSCOPIC
BILIRUBIN URINE: NEGATIVE
GLUCOSE, UA: 50 mg/dL — AB
HGB URINE DIPSTICK: NEGATIVE
Ketones, ur: 5 mg/dL — AB
LEUKOCYTES UA: NEGATIVE
Nitrite: NEGATIVE
PROTEIN: NEGATIVE mg/dL
Specific Gravity, Urine: 1.009 (ref 1.005–1.030)
pH: 7 (ref 5.0–8.0)

## 2017-12-07 LAB — CBC WITH DIFFERENTIAL/PLATELET
Basophils Absolute: 0 10*3/uL (ref 0.0–0.1)
Basophils Relative: 0 %
EOS PCT: 0 %
Eosinophils Absolute: 0 10*3/uL (ref 0.0–0.7)
HCT: 32.5 % — ABNORMAL LOW (ref 36.0–46.0)
Hemoglobin: 11 g/dL — ABNORMAL LOW (ref 12.0–15.0)
LYMPHS ABS: 0.9 10*3/uL (ref 0.7–4.0)
LYMPHS PCT: 17 %
MCH: 30.9 pg (ref 26.0–34.0)
MCHC: 33.8 g/dL (ref 30.0–36.0)
MCV: 91.3 fL (ref 78.0–100.0)
Monocytes Absolute: 0.3 10*3/uL (ref 0.1–1.0)
Monocytes Relative: 5 %
Neutro Abs: 4.2 10*3/uL (ref 1.7–7.7)
Neutrophils Relative %: 78 %
PLATELETS: 201 10*3/uL (ref 150–400)
RBC: 3.56 MIL/uL — AB (ref 3.87–5.11)
RDW: 13.2 % (ref 11.5–15.5)
WBC: 5.4 10*3/uL (ref 4.0–10.5)

## 2017-12-07 LAB — LIPASE, BLOOD: Lipase: 21 U/L (ref 11–51)

## 2017-12-07 MED ORDER — FENTANYL CITRATE (PF) 100 MCG/2ML IJ SOLN
50.0000 ug | Freq: Once | INTRAMUSCULAR | Status: AC
  Administered 2017-12-07: 50 ug via INTRAVENOUS
  Filled 2017-12-07: qty 2

## 2017-12-07 MED ORDER — BISACODYL 10 MG RE SUPP
10.0000 mg | Freq: Once | RECTAL | Status: AC
  Administered 2017-12-07: 10 mg via RECTAL
  Filled 2017-12-07: qty 1

## 2017-12-07 MED ORDER — ONDANSETRON HCL 4 MG PO TABS: 4.0000 mg | ORAL_TABLET | Freq: Three times a day (TID) | ORAL | 0 refills | Status: DC | PRN

## 2017-12-07 MED ORDER — ONDANSETRON HCL 4 MG/2ML IJ SOLN
4.0000 mg | Freq: Once | INTRAMUSCULAR | Status: AC
  Administered 2017-12-07: 4 mg via INTRAVENOUS
  Filled 2017-12-07: qty 2

## 2017-12-07 MED ORDER — SODIUM CHLORIDE 0.9 % IV BOLUS
1000.0000 mL | Freq: Once | INTRAVENOUS | Status: AC
  Administered 2017-12-07: 1000 mL via INTRAVENOUS

## 2017-12-07 MED ORDER — SODIUM CHLORIDE 0.9 % IV BOLUS
500.0000 mL | Freq: Once | INTRAVENOUS | Status: AC
  Administered 2017-12-07: 500 mL via INTRAVENOUS

## 2017-12-07 NOTE — ED Provider Notes (Signed)
[facility] EMERGENCY DEPARTMENT Provider Note   CSN: [id number] Arrival date & time: 12/07/17  0355  Time seen 04:02 AM   History   Chief Complaint Chief Complaint  Patient presents with  . Emesis    HPI Gloria Rowland is a 84 y.o. female.  HPI patient states she started vomiting at 4:30 PM on September 16.  She is vomited about 4-5 times.  She denies diarrhea.  She complains of right flank pain and pain across the middle of her abdomen but on exam her abdomen it is her upper abdomen.  She states that she has had this same abdominal pain constantly for a year.  She states she has had 4 CT scans that were "negative".  She states her right flank pain is getting worse.  She denies any fever.  Nobody else is been sick at home.  She states she has never seen a gastroenterologist or a urologist.  PCP Ignatius SpeckingVyas, Dhruv B, MD   Past Medical History:  Diagnosis Date  . Baker's cyst, ruptured   . CAD (coronary artery disease)   . Dementia   . History of hepatitis   . Hyperlipidemia   . Hypertension   . Lumbar degenerative disc disease   . Nephrolithiasis   . Spasmodic torticollis     Patient Active Problem List   Diagnosis Date Noted  . Abdominal pain, epigastric 12/24/2016  . Hiatal hernia 05/27/2011  . CAD (coronary artery disease)   . S/P CABG (coronary artery bypass graft)   . Hyperlipidemia   . Hypertension   . Nephrolithiasis   . Lumbar degenerative disc disease   . Dementia     Past Surgical History:  Procedure Laterality Date  . ABDOMINAL HYSTERECTOMY    . CARPAL TUNNEL RELEASE    . CHOLECYSTECTOMY    . CORONARY ARTERY BYPASS GRAFT  1998   LIMA to LAD-Dx  . ESOPHAGOGASTRODUODENOSCOPY N/A 01/15/2017   Procedure: ESOPHAGOGASTRODUODENOSCOPY (EGD);  Surgeon: Malissa Hippoehman, Najeeb U, MD;  Location: [facility];  Service: Endoscopy;  Laterality: N/A;  1:00  . KIDNEY STONE SURGERY    . LEFT HEART CATHETERIZATION WITH CORONARY/GRAFT ANGIOGRAM N/A 05/26/2011   Procedure: LEFT  HEART CATHETERIZATION WITH Isabel CapriceORONARY/GRAFT ANGIOGRAM;  Surgeon: Othella BoyerWilliam S Tilley, MD;  [facility];  Service: Cardiovascular;  Laterality: N/A;  . LUMBAR LAMINECTOMY       OB History   None      Home Medications    Prior to Admission medications   Medication Sig Start Date End Date Taking? Authorizing Provider  acetaminophen (TYLENOL) 650 MG CR tablet Take 650 mg by mouth every 6 (six) hours as needed for pain.    [provider]  amLODipine (NORVASC) 5 MG tablet Take 5 mg by mouth daily.    [provider]  aspirin EC 81 MG tablet Take 81 mg by mouth daily.    [provider]  cefUROXime (CEFTIN) 500 MG tablet Take 500 mg by mouth 2 (two) times daily with a meal.    [provider]  cephALEXin (KEFLEX) 500 MG capsule Take 1 capsule (500 mg total) by mouth 4 (four) times daily. 05/02/17   Vanetta MuldersZackowski, Scott, MD  citalopram (CELEXA) 10 MG tablet Take 10 mg by mouth daily.    [provider]  Dextromethorphan-Guaifenesin (ROBITUSSIN COUGH+CHEST CONG DM) 5-100 MG/5ML LIQD Give 10 ml as needed for cough every 4 hours prn    [provider]  GEMFIBROZIL PO Take 300 mg by mouth  2 (two) times daily.    [provider]  HYDROcodone-acetaminophen (NORCO/VICODIN) 5-325 MG tablet Take 1 tablet by mouth every 6 (six) hours as needed. 08/20/17   Horton, Mayer Masker, MD  lisinopril (PRINIVIL,ZESTRIL) 20 MG tablet Take 20 mg by mouth 2 (two) times daily.     [provider]  meclizine (ANTIVERT) 12.5 MG tablet Take 12.5 mg by mouth as needed for dizziness.    [provider]  memantine (NAMENDA) 10 MG tablet Take 10 mg by mouth 2 (two) times daily.    [provider]  ondansetron (ZOFRAN ODT) 4 MG disintegrating tablet Take 1 tablet (4 mg total) by mouth every 8 (eight) hours as needed for nausea or vomiting. 08/20/17   Horton, Mayer Masker, MD  pantoprazole (PROTONIX) 40 MG tablet Take 20 mg by mouth 2 (two) times  daily.     [provider]  polyethylene glycol powder (GLYCOLAX/MIRALAX) powder Take 17 g by mouth daily as needed. For constipation. 12/04/16   [provider]  rosuvastatin (CRESTOR) 10 MG tablet Take 10 mg by mouth daily.    [provider]  trimethoprim (TRIMPEX) 100 MG tablet Take 1 tablet by mouth daily. 04/26/17   [provider]  Vitamin D, Ergocalciferol, (DRISDOL) 50000 units CAPS capsule Take 50,000 Units by mouth every 7 (seven) days.    [provider]    Family History Family History  Problem Relation Age of Onset  . Diabetes Sister   . Diabetes Sister     Social History Social History   Tobacco Use  . Smoking status: Never Smoker  . Smokeless tobacco: Never Used  Substance Use Topics  . Alcohol use: No  . Drug use: No  lives at home Lives with spouse   Allergies   Aspirin and Codeine   Review of Systems Review of Systems  All other systems reviewed and are negative.    Physical Exam Updated Vital Signs BP (!) 185/74   Pulse 65   Temp 97.6 F (36.4 C) (Oral)   Resp 18   Ht 5\' 3"  (1.6 m)   Wt 63.5 kg   SpO2 100%   BMI 24.80 kg/m   Vital signs normal except for hypertension   Physical Exam  Constitutional: She is oriented to person, place, and time. She appears well-developed and well-nourished.  Non-toxic appearance. She does not appear ill. No distress.  HENT:  Head: Normocephalic and atraumatic.  Right Ear: External ear normal.  Left Ear: External ear normal.  Nose: Nose normal. No mucosal edema or rhinorrhea.  Mouth/Throat: Oropharynx is clear and moist and mucous membranes are normal. No dental abscesses or uvula swelling.  Eyes: Pupils are equal, round, and reactive to light. Conjunctivae and EOM are normal.  Neck: Normal range of motion and full passive range of motion without pain. Neck supple.  Cardiovascular: Normal rate, regular rhythm and normal heart sounds. Exam reveals no gallop and no  friction rub.  No murmur heard. Pulmonary/Chest: Effort normal and breath sounds normal. No respiratory distress. She has no wheezes. She has no rhonchi. She has no rales. She exhibits no tenderness and no crepitus.  Abdominal: Soft. Normal appearance and bowel sounds are normal. She exhibits no distension. There is tenderness. There is no rebound and no guarding.    Musculoskeletal: Normal range of motion. She exhibits no edema or tenderness.  Moves all extremities well.   Neurological: She is alert and oriented to person, place, and time. She has normal  strength. No cranial nerve deficit.  Skin: Skin is warm, dry and intact. No rash noted. No erythema. No pallor.  Psychiatric: She has a normal mood and affect. Her speech is normal and behavior is normal. Her mood appears not anxious.  Nursing note and vitals reviewed.    ED Treatments / Results  Labs (all labs ordered are listed, but only abnormal results are displayed) Results for orders placed or performed during the hospital encounter of 12/07/17  Comprehensive metabolic panel  Result Value Ref Range   Sodium 141 135 - 145 mmol/L   Potassium 3.2 (L) 3.5 - 5.1 mmol/L   Chloride 107 98 - 111 mmol/L   CO2 22 22 - 32 mmol/L   Glucose, Bld 182 (H) 70 - 99 mg/dL   BUN 19 8 - 23 mg/dL   Creatinine, Ser 8.11 (H) 0.44 - 1.00 mg/dL   Calcium 9.5 8.9 - 91.4 mg/dL   Total Protein 6.9 6.5 - 8.1 g/dL   Albumin 4.2 3.5 - 5.0 g/dL   AST 21 15 - 41 U/L   ALT 11 0 - 44 U/L   Alkaline Phosphatase 62 38 - 126 U/L   Total Bilirubin 0.5 0.3 - 1.2 mg/dL   GFR calc non Af Amer 46 (L) >60 mL/min   GFR calc Af Amer 53 (L) >60 mL/min   Anion gap 12 5 - 15  CBC with Differential  Result Value Ref Range   WBC 5.4 4.0 - 10.5 K/uL   RBC 3.56 (L) 3.87 - 5.11 MIL/uL   Hemoglobin 11.0 (L) 12.0 - 15.0 g/dL   HCT 78.2 (L) 95.6 - 21.3 %   MCV 91.3 78.0 - 100.0 fL   MCH 30.9 26.0 - 34.0 pg   MCHC 33.8 30.0 - 36.0 g/dL   RDW 08.6 57.8 - 46.9 %    Platelets 201 150 - 400 K/uL   Neutrophils Relative % 78 %   Neutro Abs 4.2 1.7 - 7.7 K/uL   Lymphocytes Relative 17 %   Lymphs Abs 0.9 0.7 - 4.0 K/uL   Monocytes Relative 5 %   Monocytes Absolute 0.3 0.1 - 1.0 K/uL   Eosinophils Relative 0 %   Eosinophils Absolute 0.0 0.0 - 0.7 K/uL   Basophils Relative 0 %   Basophils Absolute 0.0 0.0 - 0.1 K/uL  Lipase, blood  Result Value Ref Range   Lipase 21 11 - 51 U/L  Urinalysis, Routine w reflex microscopic  Result Value Ref Range   Color, Urine STRAW (A) YELLOW   APPearance HAZY (A) CLEAR   Specific Gravity, Urine 1.009 1.005 - 1.030   pH 7.0 5.0 - 8.0   Glucose, UA 50 (A) NEGATIVE mg/dL   Hgb urine dipstick NEGATIVE NEGATIVE   Bilirubin Urine NEGATIVE NEGATIVE   Ketones, ur 5 (A) NEGATIVE mg/dL   Protein, ur NEGATIVE NEGATIVE mg/dL   Nitrite NEGATIVE NEGATIVE   Leukocytes, UA NEGATIVE NEGATIVE   Laboratory interpretation all normal except mild anemia, hypokalemia, hyperglycemia    EKG None  Radiology Ct Renal Stone Study  Result Date: 12/07/2017 CLINICAL DATA:  Acute right flank pain, nausea, vomiting. EXAM: CT ABDOMEN AND PELVIS WITHOUT CONTRAST TECHNIQUE: Multidetector CT imaging of the abdomen and pelvis was performed following the standard protocol without IV contrast. COMPARISON:  CT scan of October 28, 2017. FINDINGS: Lower chest: No acute abnormality. Hepatobiliary: Status post cholecystectomy. Stable intrahepatic and extrahepatic biliary dilatation is noted most likely due to post cholecystectomy status. Liver is otherwise unremarkable. Pancreas:  No ductal dilatation is noted. Stable 17 mm low density is noted in uncinate process of pancreas. No inflammatory changes are noted. Spleen: Normal in size without focal abnormality. Adrenals/Urinary Tract: Adrenal glands appear normal. Stable mild right hydronephrosis is noted most likely due to chronic UPJ stenosis. Stable severe left hydronephrosis is noted most likely due to  chronic UPJ stenosis. Urinary bladder is unremarkable. Stomach/Bowel: The stomach appears normal. The appendix is not clearly visualized. There is no evidence of bowel obstruction or inflammation. Large amount of stool is seen in distal sigmoid colon and rectum. Vascular/Lymphatic: Aortic atherosclerosis. No enlarged abdominal or pelvic lymph nodes. Reproductive: Status post hysterectomy. No adnexal masses. Other: No abdominal wall hernia or abnormality. No abdominopelvic ascites. Musculoskeletal: No acute or significant osseous findings. IMPRESSION: Stable bilateral hydronephrosis, left greater than right, most likely due to chronic UPJ stenosis. Stable 1.7 cm cystic abnormality seen in uncinate process of pancreas. Stable intrahepatic and extrahepatic biliary dilatation is noted most likely due to post cholecystectomy status. No significant changes noted compared to prior exam. Aortic Atherosclerosis (ICD10-I70.0). Electronically Signed   By: Lupita Raider, M.D.   On: 12/07/2017 07:41     CT abdomen/pelvis with contrast Aug 20, 2017 IMPRESSION: 1. Severe chronic left-sided hydronephrosis and mild right-sided hydronephrosis. Left-sided hydronephrosis appears to reflect a stricture at the left ureteropelvic junction. An obstructing right ureteral stone cannot be excluded, as the right ureter is not well characterized. Would correlate with the patient's symptoms. 2. 2.2 cm cystic structure noted at the head of the pancreas, relatively similar to 2018. Recommend follow up pre and post contrast MRI/MRCP or pancreatic protocol CT in 2 years. This recommendation follows ACR consensus guidelines: Management of Incidental Pancreatic Cysts: A White Paper of the ACR Incidental Findings Committee. J Am Coll Radiol 2017;14:911-923. 3. Chronic atrophy of portions of the right gluteus and quadriceps musculature.  Aortic Atherosclerosis (ICD10-I70.0).   Electronically Signed   By: Roanna Raider M.D.    On: 08/20/2017 03:03  CT abdomen/pelvis with contrast January 04, 2017 IMPRESSION: Thickening at the gastroesophageal junction. Although this may be explained by under distension, mass or inflammation not excluded.  Marked left hydronephrosis with an appearance of left ureteral pelvic junction obstruction without obstructing stone or mass seen at this level.  Post cholecystectomy. Prominent intra and extra hepatic biliary ducts with the common bile duct measuring up to 1.5 cm. No calcified common bile duct stone or obstructing mass is noted. If the patient does not have elevated liver function studies than biliary duct prominence may be related to age and postcholecystectomy state. If there are elevated liver function studies than further delineation with MRCP may be considered.  Anterior to the inferior vena cava just lateral to the portal vein is a 1.5 x 1.6 x 1.4 cm low-density structure of indeterminate etiology. This does not connect to the biliary system or pancreas.Cannot completely exclude necrotic adenopathy.  Aortic Atherosclerosis (ICD10-I70.0).  Scoliosis with degenerative changes with spinal stenosis most prominent L3-4 level.  Post hysterectomy.   Electronically Signed   By: Lacy Duverney M.D.   On: 01/04/2017 16:15   Procedures Procedures (including critical care time)  Medications Ordered in ED Medications  bisacodyl (DULCOLAX) suppository 10 mg (has no administration in time range)  ondansetron (ZOFRAN) injection 4 mg (4 mg Intravenous Given 12/07/17 0424)  sodium chloride 0.9 % bolus 1,000 mL (0 mLs Intravenous Stopped 12/07/17 0529)  sodium chloride 0.9 % bolus 500 mL (0 mLs Intravenous Stopped 12/07/17  1610)  fentaNYL (SUBLIMAZE) injection 50 mcg (50 mcg Intravenous Given 12/07/17 0424)  ondansetron (ZOFRAN) injection 4 mg (4 mg Intravenous Given 12/07/17 0603)  fentaNYL (SUBLIMAZE) injection 50 mcg (50 mcg Intravenous Given 12/07/17 0637)    ondansetron (ZOFRAN) injection 4 mg (4 mg Intravenous Given 12/07/17 0637)  sodium chloride 0.9 % bolus 1,000 mL (0 mLs Intravenous Stopped 12/07/17 0745)     Initial Impression / Assessment and Plan / ED Course  I have reviewed the triage vital signs and the nursing notes.  Pertinent labs & imaging results that were available during my care of the patient were reviewed by me and considered in my medical decision making (see chart for details).     Patient was given IV fluids, IV nausea and pain.  Patient continues to complain of nausea pain in her nausea and pain medicine was repeated.    6:15 AM family is in the room.  They were aware of the abnormality on her CT scan showing the blockage of the ureter.  They state however they never followed through with the urology consult because she got better.  At this point we decided to do another CT scan tonight.  Review patient's CT scan she has lots of stool in her right colon and down into her sigmoid area.  I talked to the patient and her visitor.  Patient states she has had constipation "all my life".  She states she takes MiraLAX at bedtime and takes local magnesia about once a day.  We discussed that she needs to take more.  She was given a Dulcolax suppository prior to discharge.  She can use a fleets enema today when she gets home.  She should take several doses of MiraLAX a day to get things going and then take it twice a day instead of once a day to help prevent her constipation from recurring.  Review of the [place] shows patient only has one prescription in the past 2 years, #15 hydrocodone 5/325 filled on Aug 20, 2017 this was prescribed from the ED  Final Clinical Impressions(s) / ED Diagnoses   Final diagnoses:  Right sided abdominal pain  Constipation, unspecified constipation type    ED Discharge Orders    None     Plan discharge  Devoria Albe, MD, Concha Pyo, MD 12/07/17 [phone]

## 2017-12-07 NOTE — ED Notes (Signed)
Pt assisted on bedpan and repositioned in bed; pt began vomiting, Dr. Lynelle DoctorKnapp notified and zofran given for vomiting

## 2017-12-07 NOTE — ED Notes (Signed)
Pt back from CT

## 2017-12-07 NOTE — Discharge Instructions (Addendum)
Get miralax and put one dose or 17 g in 8 ounces of water,  take 1 dose every 30 minutes for 2-3 hours or until you  get good results and then once or twice daily to prevent constipation.  You can also try a fleets enema today to help get things going.

## 2017-12-07 NOTE — ED Triage Notes (Signed)
Pt brought in by [facility] for c/o n/v since yesterday at 4pm; pt was given zofran 4mg  IV be ems en route to hospital;

## 2018-01-05 DIAGNOSIS — E7800 Pure hypercholesterolemia, unspecified: Secondary | ICD-10-CM | POA: Diagnosis not present

## 2018-01-05 DIAGNOSIS — F0390 Unspecified dementia without behavioral disturbance: Secondary | ICD-10-CM | POA: Diagnosis not present

## 2018-01-05 DIAGNOSIS — I10 Essential (primary) hypertension: Secondary | ICD-10-CM | POA: Diagnosis not present

## 2018-01-13 DIAGNOSIS — N130 Hydronephrosis with ureteropelvic junction obstruction: Secondary | ICD-10-CM | POA: Diagnosis not present

## 2018-01-24 DIAGNOSIS — F0390 Unspecified dementia without behavioral disturbance: Secondary | ICD-10-CM | POA: Diagnosis not present

## 2018-01-24 DIAGNOSIS — I10 Essential (primary) hypertension: Secondary | ICD-10-CM | POA: Diagnosis not present

## 2018-01-24 DIAGNOSIS — Z1211 Encounter for screening for malignant neoplasm of colon: Secondary | ICD-10-CM | POA: Diagnosis not present

## 2018-01-24 DIAGNOSIS — Z7189 Other specified counseling: Secondary | ICD-10-CM | POA: Diagnosis not present

## 2018-01-24 DIAGNOSIS — Z1331 Encounter for screening for depression: Secondary | ICD-10-CM | POA: Diagnosis not present

## 2018-01-24 DIAGNOSIS — Z6820 Body mass index (BMI) 20.0-20.9, adult: Secondary | ICD-10-CM | POA: Diagnosis not present

## 2018-01-24 DIAGNOSIS — Z0000 Encounter for general adult medical examination without abnormal findings: Secondary | ICD-10-CM | POA: Diagnosis not present

## 2018-01-24 DIAGNOSIS — Z1339 Encounter for screening examination for other mental health and behavioral disorders: Secondary | ICD-10-CM | POA: Diagnosis not present

## 2018-01-24 DIAGNOSIS — E7800 Pure hypercholesterolemia, unspecified: Secondary | ICD-10-CM | POA: Diagnosis not present

## 2018-01-24 DIAGNOSIS — Z79899 Other long term (current) drug therapy: Secondary | ICD-10-CM | POA: Diagnosis not present

## 2018-01-24 DIAGNOSIS — R5383 Other fatigue: Secondary | ICD-10-CM | POA: Diagnosis not present

## 2018-01-24 DIAGNOSIS — Z299 Encounter for prophylactic measures, unspecified: Secondary | ICD-10-CM | POA: Diagnosis not present

## 2018-02-14 DIAGNOSIS — F0390 Unspecified dementia without behavioral disturbance: Secondary | ICD-10-CM | POA: Diagnosis not present

## 2018-02-14 DIAGNOSIS — E7800 Pure hypercholesterolemia, unspecified: Secondary | ICD-10-CM | POA: Diagnosis not present

## 2018-02-14 DIAGNOSIS — I10 Essential (primary) hypertension: Secondary | ICD-10-CM | POA: Diagnosis not present

## 2018-03-11 DIAGNOSIS — F0390 Unspecified dementia without behavioral disturbance: Secondary | ICD-10-CM | POA: Diagnosis not present

## 2018-03-11 DIAGNOSIS — I10 Essential (primary) hypertension: Secondary | ICD-10-CM | POA: Diagnosis not present

## 2018-03-11 DIAGNOSIS — E7800 Pure hypercholesterolemia, unspecified: Secondary | ICD-10-CM | POA: Diagnosis not present

## 2018-04-09 ENCOUNTER — Inpatient Hospital Stay (HOSPITAL_COMMUNITY)
Admission: EM | Admit: 2018-04-09 | Discharge: 2018-04-12 | DRG: 392 | Disposition: A | Discharge status: Home / Self Care | Payer: Medicare Other | Attending: Internal Medicine | Admitting: Internal Medicine

## 2018-04-09 ENCOUNTER — Other Ambulatory Visit: Payer: Self-pay

## 2018-04-09 ENCOUNTER — Emergency Department (HOSPITAL_COMMUNITY): Payer: Medicare Other

## 2018-04-09 ENCOUNTER — Encounter (HOSPITAL_COMMUNITY): Payer: Self-pay | Admitting: Emergency Medicine

## 2018-04-09 DIAGNOSIS — R110 Nausea: Secondary | ICD-10-CM | POA: Diagnosis not present

## 2018-04-09 DIAGNOSIS — Z886 Allergy status to analgesic agent status: Secondary | ICD-10-CM

## 2018-04-09 DIAGNOSIS — R4189 Other symptoms and signs involving cognitive functions and awareness: Secondary | ICD-10-CM | POA: Diagnosis not present

## 2018-04-09 DIAGNOSIS — Z885 Allergy status to narcotic agent status: Secondary | ICD-10-CM

## 2018-04-09 DIAGNOSIS — F0390 Unspecified dementia without behavioral disturbance: Secondary | ICD-10-CM

## 2018-04-09 DIAGNOSIS — R1013 Epigastric pain: Secondary | ICD-10-CM | POA: Diagnosis not present

## 2018-04-09 DIAGNOSIS — Z951 Presence of aortocoronary bypass graft: Secondary | ICD-10-CM

## 2018-04-09 DIAGNOSIS — R1084 Generalized abdominal pain: Secondary | ICD-10-CM | POA: Diagnosis not present

## 2018-04-09 DIAGNOSIS — I10 Essential (primary) hypertension: Secondary | ICD-10-CM | POA: Diagnosis present

## 2018-04-09 DIAGNOSIS — A084 Viral intestinal infection, unspecified: Principal | ICD-10-CM | POA: Diagnosis present

## 2018-04-09 DIAGNOSIS — R000 Tachycardia, unspecified: Secondary | ICD-10-CM | POA: Diagnosis present

## 2018-04-09 DIAGNOSIS — I2510 Atherosclerotic heart disease of native coronary artery without angina pectoris: Secondary | ICD-10-CM | POA: Diagnosis present

## 2018-04-09 DIAGNOSIS — K449 Diaphragmatic hernia without obstruction or gangrene: Secondary | ICD-10-CM | POA: Diagnosis present

## 2018-04-09 DIAGNOSIS — Z9049 Acquired absence of other specified parts of digestive tract: Secondary | ICD-10-CM

## 2018-04-09 DIAGNOSIS — Z79899 Other long term (current) drug therapy: Secondary | ICD-10-CM

## 2018-04-09 DIAGNOSIS — K862 Cyst of pancreas: Secondary | ICD-10-CM | POA: Diagnosis present

## 2018-04-09 DIAGNOSIS — N1330 Unspecified hydronephrosis: Secondary | ICD-10-CM | POA: Diagnosis not present

## 2018-04-09 DIAGNOSIS — Z7982 Long term (current) use of aspirin: Secondary | ICD-10-CM

## 2018-04-09 DIAGNOSIS — E785 Hyperlipidemia, unspecified: Secondary | ICD-10-CM | POA: Diagnosis present

## 2018-04-09 DIAGNOSIS — R0689 Other abnormalities of breathing: Secondary | ICD-10-CM | POA: Diagnosis not present

## 2018-04-09 DIAGNOSIS — R1010 Upper abdominal pain, unspecified: Secondary | ICD-10-CM

## 2018-04-09 DIAGNOSIS — Z66 Do not resuscitate: Secondary | ICD-10-CM | POA: Diagnosis present

## 2018-04-09 DIAGNOSIS — R112 Nausea with vomiting, unspecified: Secondary | ICD-10-CM | POA: Diagnosis present

## 2018-04-09 LAB — COMPREHENSIVE METABOLIC PANEL
ALBUMIN: 4.6 g/dL (ref 3.5–5.0)
ALT: 17 U/L (ref 0–44)
ANION GAP: 13 (ref 5–15)
AST: 26 U/L (ref 15–41)
Alkaline Phosphatase: 59 U/L (ref 38–126)
BILIRUBIN TOTAL: 0.4 mg/dL (ref 0.3–1.2)
BUN: 20 mg/dL (ref 8–23)
CHLORIDE: 103 mmol/L (ref 98–111)
CO2: 20 mmol/L — ABNORMAL LOW (ref 22–32)
Calcium: 9.6 mg/dL (ref 8.9–10.3)
Creatinine, Ser: 1.06 mg/dL — ABNORMAL HIGH (ref 0.44–1.00)
GFR calc Af Amer: 56 mL/min — ABNORMAL LOW (ref >60)
GFR, EST NON AFRICAN AMERICAN: 49 mL/min — AB (ref >60)
GLUCOSE: 160 mg/dL — AB (ref 70–99)
POTASSIUM: 3.6 mmol/L (ref 3.5–5.1)
Sodium: 136 mmol/L (ref 135–145)
TOTAL PROTEIN: 7.4 g/dL (ref 6.5–8.1)

## 2018-04-09 LAB — CBC WITH DIFFERENTIAL/PLATELET
Abs Immature Granulocytes: 0.01 10*3/uL (ref 0.00–0.07)
BASOS ABS: 0 10*3/uL (ref 0.0–0.1)
BASOS PCT: 0 %
EOS ABS: 0 10*3/uL (ref 0.0–0.5)
EOS PCT: 0 %
HEMATOCRIT: 34.5 % — AB (ref 36.0–46.0)
Hemoglobin: 11.5 g/dL — ABNORMAL LOW (ref 12.0–15.0)
IMMATURE GRANULOCYTES: 0 %
LYMPHS ABS: 1.3 10*3/uL (ref 0.7–4.0)
Lymphocytes Relative: 26 %
MCH: 30.6 pg (ref 26.0–34.0)
MCHC: 33.3 g/dL (ref 30.0–36.0)
MCV: 91.8 fL (ref 80.0–100.0)
Monocytes Absolute: 0.3 10*3/uL (ref 0.1–1.0)
Monocytes Relative: 5 %
NEUTROS PCT: 69 %
NRBC: 0 % (ref 0.0–0.2)
Neutro Abs: 3.4 10*3/uL (ref 1.7–7.7)
PLATELETS: 216 10*3/uL (ref 150–400)
RBC: 3.76 MIL/uL — ABNORMAL LOW (ref 3.87–5.11)
RDW: 12.9 % (ref 11.5–15.5)
WBC: 4.9 10*3/uL (ref 4.0–10.5)

## 2018-04-09 LAB — I-STAT CREATININE, ED: Creatinine, Ser: 1.1 mg/dL — ABNORMAL HIGH (ref 0.44–1.00)

## 2018-04-09 LAB — LIPASE, BLOOD: LIPASE: 23 U/L (ref 11–51)

## 2018-04-09 LAB — TYPE AND SCREEN
ABO/RH(D): A POS
ANTIBODY SCREEN: NEGATIVE

## 2018-04-09 MED ORDER — IOPAMIDOL (ISOVUE-300) INJECTION 61%
100.0000 mL | Freq: Once | INTRAVENOUS | Status: AC | PRN
  Administered 2018-04-09: 100 mL via INTRAVENOUS

## 2018-04-09 MED ORDER — FAMOTIDINE IN NACL 20-0.9 MG/50ML-% IV SOLN
20.0000 mg | Freq: Once | INTRAVENOUS | Status: AC
  Administered 2018-04-09: 20 mg via INTRAVENOUS
  Filled 2018-04-09: qty 50

## 2018-04-09 MED ORDER — ONDANSETRON HCL 4 MG/2ML IJ SOLN
4.0000 mg | Freq: Once | INTRAMUSCULAR | Status: AC
  Administered 2018-04-09: 4 mg via INTRAVENOUS
  Filled 2018-04-09: qty 2

## 2018-04-09 MED ORDER — PANTOPRAZOLE SODIUM 40 MG IV SOLR
40.0000 mg | Freq: Once | INTRAVENOUS | Status: AC
Start: 2018-04-09 — End: 2018-04-09
  Administered 2018-04-09: 40 mg via INTRAVENOUS
  Filled 2018-04-09: qty 40

## 2018-04-09 MED ORDER — SODIUM CHLORIDE 0.9 % IV BOLUS
500.0000 mL | Freq: Once | INTRAVENOUS | Status: AC
  Administered 2018-04-09: 500 mL via INTRAVENOUS

## 2018-04-09 NOTE — ED Triage Notes (Signed)
Pt here EMS from home c/o abd pain, epigastric pain, back pain, emesis x 3 episodes starting today, denies diarrhea, hypertensive en route, pt report she has not taken BP today

## 2018-04-09 NOTE — ED Provider Notes (Signed)
[facility] Provider Note   CSN: [id number] Arrival date & time: 04/09/18  2122     History   Chief Complaint Chief Complaint  Patient presents with  . Abdominal Pain    HPI Gloria Rowland is a 85 y.o. female.  Patient complains of epigastric abdominal pain and vomiting.  She has vomited 6 times today and states that she seen some blood last couple times  The history is provided by the patient. No language interpreter was used.  Abdominal Pain  Pain location:  Epigastric Pain quality: aching   Pain radiates to:  Does not radiate Pain severity:  Moderate Onset quality:  Sudden Duration:  12 hours Timing:  Constant Progression:  Worsening Chronicity:  Recurrent Context: not alcohol use   Relieved by:  Nothing Worsened by:  Nothing Ineffective treatments:  None tried Associated symptoms: no chest pain, no cough, no diarrhea, no fatigue and no hematuria     Past Medical History:  Diagnosis Date  . Baker's cyst, ruptured   . CAD (coronary artery disease)   . Dementia (HCC)   . History of hepatitis   . Hyperlipidemia   . Hypertension   . Lumbar degenerative disc disease   . Nephrolithiasis   . Spasmodic torticollis     Patient Active Problem List   Diagnosis Date Noted  . Abdominal pain, epigastric 12/24/2016  . Hiatal hernia 05/27/2011  . CAD (coronary artery disease)   . S/P CABG (coronary artery bypass graft)   . Hyperlipidemia   . Hypertension   . Nephrolithiasis   . Lumbar degenerative disc disease   . Dementia [facility](HCC)     Past Surgical History:  Procedure Laterality Date  . ABDOMINAL HYSTERECTOMY    . CARPAL TUNNEL RELEASE    . CHOLECYSTECTOMY    . CORONARY ARTERY BYPASS GRAFT  1998   LIMA to LAD-Dx  . ESOPHAGOGASTRODUODENOSCOPY N/A 01/15/2017   Procedure: ESOPHAGOGASTRODUODENOSCOPY (EGD);  Surgeon: Malissa Hippoehman, Najeeb U, MD;  Location: [facility];  Service: Endoscopy;  Laterality: N/A;  1:00  . KIDNEY STONE SURGERY    .  LEFT HEART CATHETERIZATION WITH CORONARY/GRAFT ANGIOGRAM N/A 05/26/2011   Procedure: LEFT HEART CATHETERIZATION WITH Isabel CapriceORONARY/GRAFT ANGIOGRAM;  Surgeon: Othella BoyerWilliam S Tilley, MD;  [facility];  Service: Cardiovascular;  Laterality: N/A;  . LUMBAR LAMINECTOMY       OB History   No obstetric history on file.      Home Medications    Prior to Admission medications   Medication Sig Start Date End Date Taking? Authorizing Provider  acetaminophen (TYLENOL) 650 MG CR tablet Take 650 mg by mouth every 6 (six) hours as needed for pain.    [provider]  amLODipine (NORVASC) 5 MG tablet Take 5 mg by mouth daily.    [provider]  aspirin EC 81 MG tablet Take 81 mg by mouth daily.    [provider]  cefUROXime (CEFTIN) 500 MG tablet Take 500 mg by mouth 2 (two) times daily with a meal.    [provider]  cephALEXin (KEFLEX) 500 MG capsule Take 1 capsule (500 mg total) by mouth 4 (four) times daily. 05/02/17   Vanetta MuldersZackowski, Scott, MD  citalopram (CELEXA) 10 MG tablet Take 10 mg by mouth daily.    [provider]  Dextromethorphan-Guaifenesin (ROBITUSSIN COUGH+CHEST CONG DM) 5-100 MG/5ML LIQD Give 10 ml as needed for cough every 4 hours prn    [provider]  GEMFIBROZIL PO Take 300 mg  by mouth 2 (two) times daily.    [provider]  HYDROcodone-acetaminophen (NORCO/VICODIN) 5-325 MG tablet Take 1 tablet by mouth every 6 (six) hours as needed. 08/20/17   Horton, Mayer Masker, MD  lisinopril (PRINIVIL,ZESTRIL) 20 MG tablet Take 20 mg by mouth 2 (two) times daily.     [provider]  meclizine (ANTIVERT) 12.5 MG tablet Take 12.5 mg by mouth as needed for dizziness.    [provider]  memantine (NAMENDA) 10 MG tablet Take 10 mg by mouth 2 (two) times daily.    [provider]  ondansetron (ZOFRAN ODT) 4 MG disintegrating tablet Take 1 tablet (4 mg total) by mouth every 8 (eight) hours as needed for nausea or  vomiting. 08/20/17   Horton, Mayer Masker, MD  ondansetron (ZOFRAN) 4 MG tablet Take 1 tablet (4 mg total) by mouth every 8 (eight) hours as needed. 12/07/17   Devoria Albe, MD  pantoprazole (PROTONIX) 40 MG tablet Take 20 mg by mouth 2 (two) times daily.     [provider]  polyethylene glycol powder (GLYCOLAX/MIRALAX) powder Take 17 g by mouth daily as needed. For constipation. 12/04/16   [provider]  rosuvastatin (CRESTOR) 10 MG tablet Take 10 mg by mouth daily.    [provider]  trimethoprim (TRIMPEX) 100 MG tablet Take 1 tablet by mouth daily. 04/26/17   [provider]  Vitamin D, Ergocalciferol, (DRISDOL) 50000 units CAPS capsule Take 50,000 Units by mouth every 7 (seven) days.    [provider]    Family History Family History  Problem Relation Age of Onset  . Diabetes Sister   . Diabetes Sister     Social History Social History   Tobacco Use  . Smoking status: Never Smoker  . Smokeless tobacco: Never Used  Substance Use Topics  . Alcohol use: No  . Drug use: No     Allergies   Aspirin and Codeine   Review of Systems Review of Systems  Constitutional: Negative for appetite change and fatigue.  HENT: Negative for congestion, ear discharge and sinus pressure.   Eyes: Negative for discharge.  Respiratory: Negative for cough.   Cardiovascular: Negative for chest pain.  Gastrointestinal: Positive for abdominal pain. Negative for diarrhea.  Genitourinary: Negative for frequency and hematuria.  Musculoskeletal: Negative for back pain.  Skin: Negative for rash.  Neurological: Negative for seizures and headaches.  Psychiatric/Behavioral: Negative for hallucinations.     Physical Exam Updated Vital Signs BP (!) 176/66   Pulse (!) 53   Temp 97.9 F (36.6 C) (Oral)   Resp 13   Ht 4\' 11"  (1.499 m)   Wt 57.2 kg   SpO2 100%   BMI 25.47 kg/m   Physical Exam Vitals signs and nursing note reviewed.  Constitutional:       Appearance: She is well-developed.  HENT:     Head: Normocephalic.     Nose: Nose normal.  Eyes:     General: No scleral icterus.    Conjunctiva/sclera: Conjunctivae normal.  Neck:     Musculoskeletal: Neck supple.     Thyroid: No thyromegaly.  Cardiovascular:     Rate and Rhythm: Normal rate and regular rhythm.     Heart sounds: No murmur. No friction rub. No gallop.   Pulmonary:     Breath sounds: No stridor. No wheezing or rales.  Chest:     Chest wall: No tenderness.  Abdominal:     General: There is no distension.  Tenderness: There is abdominal tenderness. There is no rebound.  Musculoskeletal: Normal range of motion.  Lymphadenopathy:     Cervical: No cervical adenopathy.  Skin:    Findings: No erythema or rash.  Neurological:     Mental Status: She is oriented to person, place, and time.     Motor: No abnormal muscle tone.     Coordination: Coordination normal.  Psychiatric:        Behavior: Behavior normal.      ED Treatments / Results  Labs (all labs ordered are listed, but only abnormal results are displayed) Labs Reviewed  CBC WITH DIFFERENTIAL/PLATELET - Abnormal; Notable for the following components:      Result Value   RBC 3.76 (*)    Hemoglobin 11.5 (*)    HCT 34.5 (*)    All other components within normal limits  COMPREHENSIVE METABOLIC PANEL - Abnormal; Notable for the following components:   CO2 20 (*)    Glucose, Bld 160 (*)    Creatinine, Ser 1.06 (*)    GFR calc non Af Amer 49 (*)    GFR calc Af Amer 56 (*)    All other components within normal limits  I-STAT CREATININE, ED - Abnormal; Notable for the following components:   Creatinine, Ser 1.10 (*)    All other components within normal limits  LIPASE, BLOOD  URINALYSIS, ROUTINE W REFLEX MICROSCOPIC  TYPE AND SCREEN    EKG None  Radiology Ct Abdomen Pelvis W Contrast  Result Date: 04/09/2018 CLINICAL DATA:  abd pain, epigastric pain, back pain, emesis x 3 episodes starting  today, denies diarrhea, hypertensive en route, pt report she has not taken BP today Abdominal distention^15mL ISOVUE-300 IOPAMIDOL (ISOVUE-300) INJECTION 61%Abd distension EXAM: CT ABDOMEN AND PELVIS WITH CONTRAST TECHNIQUE: Multidetector CT imaging of the abdomen and pelvis was performed using the standard protocol following bolus administration of intravenous contrast. CONTRAST:  ISOVUE-300 IOPAMIDOL (ISOVUE-300) INJECTION 61% COMPARISON:  CT 12/07/2017, 08/20/2017 FINDINGS: Lower chest: Lung bases are clear. Hepatobiliary: Postcholecystectomy. Mild intrahepatic and moderate extrahepatic duct dilatation similar to comparison exam. Common bile duct measures 7 mm through the pancreatic head unchanged from 10 mm on prior. Pancreas: Cystic lesion head of the pancreas measuring 18 by 11 mm (image 19/2) is not changed from 19 mm x 10 mm on comparison exam from 08/20/2017. No ductal dilatation in the pancreatic body or tail. Spleen: Normal spleen Adrenals/urinary tract: Adrenal glands normal. There is chronic hydronephrosis of the LEFT kidney and LEFT renal pelvis. Findings consistent chronic UPJ obstruction. Distal LEFT ureter normal. RIGHT kidney normal. Bladder normal Stomach/Bowel: Stomach, duodenum, small-bowel normal. Cecum normal. Appendix not identified. Ascending, transverse and descending colon normal. Rectum normal Vascular/Lymphatic: Abdominal aorta is normal caliber with atherosclerotic calcification. There is no retroperitoneal or periportal lymphadenopathy. No pelvic lymphadenopathy. Reproductive: Post hysterectomy. Other: No free fluid. Musculoskeletal: No aggressive osseous lesion. IMPRESSION: 1. No clear acute abdominopelvic findings. 2. Chronic dilatation of the extrahepatic bile ducts following cholecystectomy. 3. Chronic LEFT UPJ obstruction. 4. Stable small cystic lesion towards the head of the pancreas. 5. No bowel obstruction. Electronically Signed   By: Genevive Bi M.D.   On:  04/09/2018 23:47    Procedures Procedures (including critical care time)  Medications Ordered in ED Medications  famotidine (PEPCID) IVPB 20 mg premix (has no administration in time range)  sodium chloride 0.9 % bolus 500 mL ( Intravenous Stopped 04/09/18 2310)  pantoprazole (PROTONIX) injection 40 mg (40 mg Intravenous Given 04/09/18 2159)  ondansetron [facility](ZOFRAN) injection 4 mg (4 mg Intravenous Given 04/09/18 2159)  iopamidol (ISOVUE-300) 61 % injection 100 mL (100 mLs Intravenous Contrast Given 04/09/18 2232)     Initial Impression / Assessment and Plan / ED Course  I have reviewed the triage vital signs and the nursing notes.  Pertinent labs & imaging results that were available during my care of the patient were reviewed by me and considered in my medical decision making (see chart for details).     Labs and CT scan unremarkable.  Patient with gastritis and vomiting.  Patient continues to vomit will be admitted to medicine for further treatment  Final Clinical Impressions(s) / ED Diagnoses   Final diagnoses:  None    ED Discharge Orders    None       Bethann BerkshireZammit, Juanmiguel Defelice, MD 04/09/18 2358

## 2018-04-10 DIAGNOSIS — F0280 Dementia in other diseases classified elsewhere without behavioral disturbance: Secondary | ICD-10-CM

## 2018-04-10 DIAGNOSIS — N1330 Unspecified hydronephrosis: Secondary | ICD-10-CM

## 2018-04-10 DIAGNOSIS — R112 Nausea with vomiting, unspecified: Secondary | ICD-10-CM | POA: Diagnosis not present

## 2018-04-10 DIAGNOSIS — F0390 Unspecified dementia without behavioral disturbance: Secondary | ICD-10-CM

## 2018-04-10 DIAGNOSIS — R1010 Upper abdominal pain, unspecified: Secondary | ICD-10-CM

## 2018-04-10 LAB — CBC
HCT: 30.7 % — ABNORMAL LOW (ref 36.0–46.0)
Hemoglobin: 10.1 g/dL — ABNORMAL LOW (ref 12.0–15.0)
MCH: 31.1 pg (ref 26.0–34.0)
MCHC: 32.9 g/dL (ref 30.0–36.0)
MCV: 94.5 fL (ref 80.0–100.0)
Platelets: 205 10*3/uL (ref 150–400)
RBC: 3.25 MIL/uL — ABNORMAL LOW (ref 3.87–5.11)
RDW: 13 % (ref 11.5–15.5)
WBC: 4.9 10*3/uL (ref 4.0–10.5)
nRBC: 0 % (ref 0.0–0.2)

## 2018-04-10 LAB — URINALYSIS, ROUTINE W REFLEX MICROSCOPIC
Bacteria, UA: NONE SEEN
Bilirubin Urine: NEGATIVE
GLUCOSE, UA: NEGATIVE mg/dL
Hgb urine dipstick: NEGATIVE
Ketones, ur: 5 mg/dL — AB
Nitrite: NEGATIVE
PROTEIN: NEGATIVE mg/dL
SPECIFIC GRAVITY, URINE: 1.016 (ref 1.005–1.030)
pH: 7 (ref 5.0–8.0)

## 2018-04-10 LAB — COMPREHENSIVE METABOLIC PANEL
ALT: 14 U/L (ref 0–44)
AST: 20 U/L (ref 15–41)
Albumin: 3.8 g/dL (ref 3.5–5.0)
Alkaline Phosphatase: 51 U/L (ref 38–126)
Anion gap: 9 (ref 5–15)
BUN: 16 mg/dL (ref 8–23)
CO2: 22 mmol/L (ref 22–32)
Calcium: 8.6 mg/dL — ABNORMAL LOW (ref 8.9–10.3)
Chloride: 103 mmol/L (ref 98–111)
Creatinine, Ser: 0.94 mg/dL (ref 0.44–1.00)
GFR calc Af Amer: >60 mL/min (ref >60)
GFR, EST NON AFRICAN AMERICAN: 56 mL/min — AB (ref >60)
Glucose, Bld: 134 mg/dL — ABNORMAL HIGH (ref 70–99)
Potassium: 3.6 mmol/L (ref 3.5–5.1)
Sodium: 134 mmol/L — ABNORMAL LOW (ref 135–145)
Total Bilirubin: 0.5 mg/dL (ref 0.3–1.2)
Total Protein: 6.1 g/dL — ABNORMAL LOW (ref 6.5–8.1)

## 2018-04-10 MED ORDER — ENOXAPARIN SODIUM 40 MG/0.4ML ~~LOC~~ SOLN
40.0000 mg | SUBCUTANEOUS | Status: DC
  Administered 2018-04-11 – 2018-04-12 (×2): 40 mg via SUBCUTANEOUS
  Filled 2018-04-10 (×2): qty 0.4

## 2018-04-10 MED ORDER — AMLODIPINE BESYLATE 5 MG PO TABS
5.0000 mg | ORAL_TABLET | Freq: Every day | ORAL | Status: DC
  Administered 2018-04-10 – 2018-04-12 (×3): 5 mg via ORAL
  Filled 2018-04-10 (×3): qty 1

## 2018-04-10 MED ORDER — POTASSIUM CHLORIDE IN NACL 20-0.9 MEQ/L-% IV SOLN
INTRAVENOUS | Status: DC
  Administered 2018-04-10 – 2018-04-12 (×3): via INTRAVENOUS

## 2018-04-10 MED ORDER — ONDANSETRON HCL 4 MG/2ML IJ SOLN
4.0000 mg | Freq: Four times a day (QID) | INTRAMUSCULAR | Status: DC | PRN
  Administered 2018-04-10: 4 mg via INTRAVENOUS
  Filled 2018-04-10: qty 2

## 2018-04-10 MED ORDER — ENOXAPARIN SODIUM 30 MG/0.3ML ~~LOC~~ SOLN: 30.0000 mg | SUBCUTANEOUS | Status: DC

## 2018-04-10 MED ORDER — PANTOPRAZOLE SODIUM 40 MG IV SOLR
40.0000 mg | Freq: Two times a day (BID) | INTRAVENOUS | Status: DC
  Administered 2018-04-10 – 2018-04-12 (×4): 40 mg via INTRAVENOUS
  Filled 2018-04-10 (×5): qty 40

## 2018-04-10 MED ORDER — PROMETHAZINE HCL 25 MG/ML IJ SOLN
12.5000 mg | Freq: Four times a day (QID) | INTRAMUSCULAR | Status: DC | PRN
  Administered 2018-04-10: 12.5 mg via INTRAVENOUS
  Filled 2018-04-10: qty 1

## 2018-04-10 MED ORDER — PANTOPRAZOLE SODIUM 40 MG PO TBEC: 40.0000 mg | DELAYED_RELEASE_TABLET | Freq: Every day | ORAL | Status: DC

## 2018-04-10 MED ORDER — ONDANSETRON HCL 4 MG PO TABS: 4.0000 mg | ORAL_TABLET | Freq: Four times a day (QID) | ORAL | Status: DC | PRN

## 2018-04-10 MED ORDER — MEMANTINE HCL 10 MG PO TABS
10.0000 mg | ORAL_TABLET | Freq: Two times a day (BID) | ORAL | Status: DC
  Administered 2018-04-10 – 2018-04-12 (×4): 10 mg via ORAL
  Filled 2018-04-10 (×5): qty 1

## 2018-04-10 MED ORDER — SODIUM CHLORIDE 0.9 % IV SOLN
INTRAVENOUS | Status: DC
  Administered 2018-04-10: 02:00:00 via INTRAVENOUS

## 2018-04-10 MED ORDER — ROSUVASTATIN CALCIUM 10 MG PO TABS
10.0000 mg | ORAL_TABLET | Freq: Every day | ORAL | Status: DC
  Administered 2018-04-10 – 2018-04-12 (×3): 10 mg via ORAL
  Filled 2018-04-10 (×3): qty 1

## 2018-04-10 MED ORDER — TRIMETHOPRIM 100 MG PO TABS
100.0000 mg | ORAL_TABLET | Freq: Every day | ORAL | Status: DC
  Administered 2018-04-10 – 2018-04-12 (×3): 100 mg via ORAL
  Filled 2018-04-10 (×4): qty 1

## 2018-04-10 MED ORDER — ACETAMINOPHEN 650 MG RE SUPP: 650.0000 mg | Freq: Four times a day (QID) | RECTAL | Status: DC | PRN

## 2018-04-10 MED ORDER — ONDANSETRON HCL 4 MG/2ML IJ SOLN
4.0000 mg | Freq: Four times a day (QID) | INTRAMUSCULAR | Status: DC
  Administered 2018-04-10 – 2018-04-12 (×6): 4 mg via INTRAVENOUS
  Filled 2018-04-10 (×7): qty 2

## 2018-04-10 MED ORDER — ACETAMINOPHEN 325 MG PO TABS
650.0000 mg | ORAL_TABLET | Freq: Four times a day (QID) | ORAL | Status: DC | PRN
  Administered 2018-04-11: 650 mg via ORAL
  Filled 2018-04-10: qty 2

## 2018-04-10 MED ORDER — HYDRALAZINE HCL 20 MG/ML IJ SOLN: 10.0000 mg | Freq: Four times a day (QID) | INTRAMUSCULAR | Status: DC | PRN

## 2018-04-10 MED ORDER — CITALOPRAM HYDROBROMIDE 20 MG PO TABS
10.0000 mg | ORAL_TABLET | Freq: Every day | ORAL | Status: DC
  Administered 2018-04-10 – 2018-04-12 (×3): 10 mg via ORAL
  Filled 2018-04-10 (×3): qty 1

## 2018-04-10 MED ORDER — HYDRALAZINE HCL 25 MG PO TABS: 25.0000 mg | ORAL_TABLET | Freq: Four times a day (QID) | ORAL | Status: DC | PRN

## 2018-04-10 MED ORDER — ENOXAPARIN SODIUM 40 MG/0.4ML ~~LOC~~ SOLN: 40.0000 mg | SUBCUTANEOUS | Status: DC

## 2018-04-10 NOTE — H&P (Signed)
TRH H&P    Patient Demographics:    Gloria Rowland, is a 85 y.o. female  MRN: [id number]  DOB - [birth year]  Admit Date - 04/09/2018  Referring MD/NP/PA: Dr. Estell Harpin  Outpatient Primary MD for the patient is Ignatius Specking, MD  Patient coming from: Home  Chief complaint-nausea and vomiting   HPI:    Gloria Rowland  is a 85 y.o. female, with history of hypertension, CAD status post CABG, hyperlipidemia, dementia came to hospital with complaint of epigastric abdominal pain and intractable nausea vomiting since this morning.  Patient says that she noticed blood in the vomitus 1 time. Patient had a EGD in October 2018 which showed normal esophagus and 2 cm hiatal hernia with no other significant abnormality. She denies diarrhea. Denies chest pain or shortness of breath. Denies fever or chills. Patient is on long-term trimethoprim to prevent recurrent UTIs. No previous history of stroke or seizures.   Review of systems:    In addition to the HPI above,   All other systems reviewed and are negative.    Past History of the following :    Past Medical History:  Diagnosis Date  . Baker's cyst, ruptured   . CAD (coronary artery disease)   . Dementia (HCC)   . History of hepatitis   . Hyperlipidemia   . Hypertension   . Lumbar degenerative disc disease   . Nephrolithiasis   . Spasmodic torticollis       Past Surgical History:  Procedure Laterality Date  . ABDOMINAL HYSTERECTOMY    . CARPAL TUNNEL RELEASE    . CHOLECYSTECTOMY    . CORONARY ARTERY BYPASS GRAFT  1998   LIMA to LAD-Dx  . ESOPHAGOGASTRODUODENOSCOPY N/A 01/15/2017   Procedure: ESOPHAGOGASTRODUODENOSCOPY (EGD);  Surgeon: Malissa Hippo, MD;  Location: [facility];  Service: Endoscopy;  Laterality: N/A;  1:00  . KIDNEY STONE SURGERY    . LEFT HEART CATHETERIZATION WITH CORONARY/GRAFT ANGIOGRAM N/A 05/26/2011   Procedure: LEFT HEART  CATHETERIZATION WITH Isabel Caprice;  Surgeon: Othella Boyer, MD;  Location: [facility] CATH LAB;  Service: Cardiovascular;  Laterality: N/A;  . LUMBAR LAMINECTOMY        Social History:      Social History   Tobacco Use  . Smoking status: Never Smoker  . Smokeless tobacco: Never Used  Substance Use Topics  . Alcohol use: No       Family History :     Family History  Problem Relation Age of Onset  . Diabetes Sister   . Diabetes Sister       Home Medications:   Prior to Admission medications   Medication Sig Start Date End Date Taking? Authorizing Provider  acetaminophen (TYLENOL) 650 MG CR tablet Take 650 mg by mouth every 6 (six) hours as needed for pain.    [provider]  amLODipine (NORVASC) 5 MG tablet Take 5 mg by mouth daily.    [provider]  aspirin EC 81 MG tablet Take 81 mg by mouth daily.    [provider]  cefUROXime (CEFTIN) 500 MG tablet Take 500 mg by mouth 2 (two) times daily with a meal.    [provider]  cephALEXin (KEFLEX) 500 MG capsule Take 1 capsule (500 mg total) by mouth 4 (four) times daily. 05/02/17   Vanetta Mulders, MD  citalopram (CELEXA) 10 MG tablet Take 10 mg by mouth daily.    [provider]  Dextromethorphan-Guaifenesin (ROBITUSSIN COUGH+CHEST CONG DM) 5-100 MG/5ML LIQD Give 10 ml as needed for cough every 4 hours prn    [provider]  GEMFIBROZIL PO Take 300 mg by mouth 2 (two) times daily.    [provider]  HYDROcodone-acetaminophen (NORCO/VICODIN) 5-325 MG tablet Take 1 tablet by mouth every 6 (six) hours as needed. 08/20/17   Horton, Mayer Masker, MD  lisinopril (PRINIVIL,ZESTRIL) 20 MG tablet Take 20 mg by mouth 2 (two) times daily.     [provider]  meclizine (ANTIVERT) 12.5 MG tablet Take 12.5 mg by mouth as needed for dizziness.    [provider]  memantine (NAMENDA) 10 MG tablet Take 10 mg by mouth 2 (two) times daily.    [provider]  ondansetron (ZOFRAN ODT) 4 MG disintegrating tablet Take 1 tablet (4 mg total) by mouth every 8 (eight) hours as needed for nausea or vomiting. 08/20/17   Horton, Mayer Masker, MD  ondansetron (ZOFRAN) 4 MG tablet Take 1 tablet (4 mg total) by mouth every 8 (eight) hours as needed. 12/07/17   Devoria Albe, MD  pantoprazole (PROTONIX) 40 MG tablet Take 20 mg by mouth 2 (two) times daily.     [provider]  polyethylene glycol powder (GLYCOLAX/MIRALAX) powder Take 17 g by mouth daily as needed. For constipation. 12/04/16   [provider]  rosuvastatin (CRESTOR) 10 MG tablet Take 10 mg by mouth daily.    [provider]  trimethoprim (TRIMPEX) 100 MG tablet Take 1 tablet by mouth daily. 04/26/17   [provider]  Vitamin D, Ergocalciferol, (DRISDOL) 50000 units CAPS capsule Take 50,000 Units by mouth every 7 (seven) days.    [provider]     Allergies:     Allergies  Allergen Reactions  . Aspirin Nausea And Vomiting    Upset stomach  . Codeine Nausea And Vomiting    Upset stomach     Physical Exam:   Vitals  Blood pressure (!) 162/69, pulse (!) 53, temperature 97.9 F (36.6 C), temperature source Oral, resp. rate 20, height 4\' 11"  (1.499 m), weight 57.2 kg, SpO2 100 %.  1.  General: Appears in no acute distress  2. Psychiatric: Alert, oriented x3, intact judgment and insight  3. Neurologic: Cranial nerves II through XII grossly intact, motor strength 5/5 in all extremities.  4. HEENMT:  Atraumatic normocephalic, oral mucosa is mildly dry.  5. Respiratory : Normal respiratory effort, clear to auscultation bilaterally  6. Cardiovascular : S1-S2, regular, no murmur auscultated.  7. Gastrointestinal:  Abdomen is soft, mild epigastric tenderness to palpation, no rigidity or guarding.  8. Skin:  No rashes noted.     Data Review:    CBC Recent Labs  Lab 04/09/18 2142  WBC 4.9  HGB 11.5*  HCT 34.5*  PLT 216   MCV 91.8  MCH 30.6  MCHC 33.3  RDW 12.9  LYMPHSABS 1.3  MONOABS 0.3  EOSABS 0.0  BASOSABS 0.0   ------------------------------------------------------------------------------------------------------------------  Results for orders placed or performed during the hospital encounter of 04/09/18 (from the past 48  hour(s))  Type and screen     Status: None   Collection Time: 04/09/18  9:42 PM  Result Value Ref Range   ABO/RH(D) A POS    Antibody Screen NEG    Sample Expiration      04/12/2018 Performed at [facility], [address] [place], [place] [address]   CBC with Differential/Platelet     Status: Abnormal   Collection Time: 04/09/18  9:42 PM  Result Value Ref Range   WBC 4.9 4.0 - 10.5 K/uL   RBC 3.76 (L) 3.87 - 5.11 MIL/uL   Hemoglobin 11.5 (L) 12.0 - 15.0 g/dL   HCT 47.834.5 (L) 29.536.0 - 62.146.0 %   MCV 91.8 80.0 - 100.0 fL   MCH 30.6 26.0 - 34.0 pg   MCHC 33.3 30.0 - 36.0 g/dL   RDW 30.812.9 65.711.5 - 84.615.5 %   Platelets 216 150 - 400 K/uL   nRBC 0.0 0.0 - 0.2 %   Neutrophils Relative % 69 %   Neutro Abs 3.4 1.7 - 7.7 K/uL   Lymphocytes Relative 26 %   Lymphs Abs 1.3 0.7 - 4.0 K/uL   Monocytes Relative 5 %   Monocytes Absolute 0.3 0.1 - 1.0 K/uL   Eosinophils Relative 0 %   Eosinophils Absolute 0.0 0.0 - 0.5 K/uL   Basophils Relative 0 %   Basophils Absolute 0.0 0.0 - 0.1 K/uL   Immature Granulocytes 0 %   Abs Immature Granulocytes 0.01 0.00 - 0.07 K/uL    Comment: Performed at [facility], [address] [place], [place] [address]  Comprehensive metabolic panel     Status: Abnormal   Collection Time: 04/09/18  9:42 PM  Result Value Ref Range   Sodium 136 135 - 145 mmol/L   Potassium 3.6 3.5 - 5.1 mmol/L   Chloride 103 98 - 111 mmol/L   CO2 20 (L) 22 - 32 mmol/L   Glucose, Bld 160 (H) 70 - 99 mg/dL   BUN 20 8 - 23 mg/dL   Creatinine, Ser 2.841.06 (H) 0.44 - 1.00 mg/dL   Calcium 9.6 8.9 - 13.210.3 mg/dL   Total Protein 7.4 6.5 - 8.1 g/dL   Albumin 4.6 3.5 - 5.0 g/dL   AST  26 15 - 41 U/L   ALT 17 0 - 44 U/L   Alkaline Phosphatase 59 38 - 126 U/L   Total Bilirubin 0.4 0.3 - 1.2 mg/dL   GFR calc non Af Amer 49 (L) >60 mL/min   GFR calc Af Amer 56 (L) >60 mL/min   Anion gap 13 5 - 15    Comment: Performed at [facility], [address] [place], [place] [address]  Lipase, blood     Status: None   Collection Time: 04/09/18  9:42 PM  Result Value Ref Range   Lipase 23 11 - 51 U/L    Comment: Performed at [facility], [address] [place], [place] [address]  I-Stat Creatinine, ED (do not order at [facility])     Status: Abnormal   Collection Time: 04/09/18  9:47 PM  Result Value Ref Range   Creatinine, Ser 1.10 (H) 0.44 - 1.00 mg/dL  Urinalysis, Routine w reflex microscopic     Status: Abnormal   Collection Time: 04/09/18 11:53 PM  Result Value Ref Range   Color, Urine YELLOW YELLOW   APPearance HAZY (A) CLEAR   Specific Gravity, Urine 1.016 1.005 - 1.030   pH 7.0 5.0 - 8.0   Glucose, UA NEGATIVE NEGATIVE  mg/dL   Hgb urine dipstick NEGATIVE NEGATIVE   Bilirubin Urine NEGATIVE NEGATIVE   Ketones, ur 5 (A) NEGATIVE mg/dL   Protein, ur NEGATIVE NEGATIVE mg/dL   Nitrite NEGATIVE NEGATIVE   Leukocytes, UA TRACE (A) NEGATIVE   RBC / HPF 0-5 0 - 5 RBC/hpf   WBC, UA 0-5 0 - 5 WBC/hpf   Bacteria, UA NONE SEEN NONE SEEN   Squamous Epithelial / LPF 0-5 0 - 5    Comment: Performed at [facility], [address] [place], [place] [address]    Chemistries  Recent Labs  Lab 04/09/18 2142 04/09/18 2147  NA 136  --   K 3.6  --   CL 103  --   CO2 20*  --   GLUCOSE 160*  --   BUN 20  --   CREATININE 1.06* 1.10*  CALCIUM 9.6  --   AST 26  --   ALT 17  --   ALKPHOS 59  --   BILITOT 0.4  --    ------------------------------------------------------------------------------------------------------------------  ------------------------------------------------------------------------------------------------------------------ GFR: Estimated Creatinine Clearance:  29.9 mL/min (A) (by C-G formula based on SCr of 1.1 mg/dL (H)). Liver Function Tests: Recent Labs  Lab 04/09/18 2142  AST 26  ALT 17  ALKPHOS 59  BILITOT 0.4  PROT 7.4  ALBUMIN 4.6   Recent Labs  Lab 04/09/18 2142  LIPASE 23    --------------------------------------------------------------------------------------------------------------- Urine analysis:    Component Value Date/Time   COLORURINE YELLOW 04/09/2018 2353   APPEARANCEUR HAZY (A) 04/09/2018 2353   LABSPEC 1.016 04/09/2018 2353   PHURINE 7.0 04/09/2018 2353   GLUCOSEU NEGATIVE 04/09/2018 2353   HGBUR NEGATIVE 04/09/2018 2353   BILIRUBINUR NEGATIVE 04/09/2018 2353   KETONESUR 5 (A) 04/09/2018 2353   PROTEINUR NEGATIVE 04/09/2018 2353   NITRITE NEGATIVE 04/09/2018 2353   LEUKOCYTESUR TRACE (A) 04/09/2018 2353      Imaging Results:    Ct Abdomen Pelvis W Contrast  Result Date: 04/09/2018 CLINICAL DATA:  abd pain, epigastric pain, back pain, emesis x 3 episodes starting today, denies diarrhea, hypertensive en route, pt report she has not taken BP today Abdominal distention^132mL ISOVUE-300 IOPAMIDOL (ISOVUE-300) INJECTION 61%Abd distension EXAM: CT ABDOMEN AND PELVIS WITH CONTRAST TECHNIQUE: Multidetector CT imaging of the abdomen and pelvis was performed using the standard protocol following bolus administration of intravenous contrast. CONTRAST:  ISOVUE-300 IOPAMIDOL (ISOVUE-300) INJECTION 61% COMPARISON:  CT 12/07/2017, 08/20/2017 FINDINGS: Lower chest: Lung bases are clear. Hepatobiliary: Postcholecystectomy. Mild intrahepatic and moderate extrahepatic duct dilatation similar to comparison exam. Common bile duct measures 7 mm through the pancreatic head unchanged from 10 mm on prior. Pancreas: Cystic lesion head of the pancreas measuring 18 by 11 mm (image 19/2) is not changed from 19 mm x 10 mm on comparison exam from 08/20/2017. No ductal dilatation in the pancreatic body or tail. Spleen: Normal spleen  Adrenals/urinary tract: Adrenal glands normal. There is chronic hydronephrosis of the LEFT kidney and LEFT renal pelvis. Findings consistent chronic UPJ obstruction. Distal LEFT ureter normal. RIGHT kidney normal. Bladder normal Stomach/Bowel: Stomach, duodenum, small-bowel normal. Cecum normal. Appendix not identified. Ascending, transverse and descending colon normal. Rectum normal Vascular/Lymphatic: Abdominal aorta is normal caliber with atherosclerotic calcification. There is no retroperitoneal or periportal lymphadenopathy. No pelvic lymphadenopathy. Reproductive: Post hysterectomy. Other: No free fluid. Musculoskeletal: No aggressive osseous lesion. IMPRESSION: 1. No clear acute abdominopelvic findings. 2. Chronic dilatation of the extrahepatic bile ducts following cholecystectomy. 3. Chronic LEFT UPJ obstruction. 4. Stable small cystic lesion towards the  head of the pancreas. 5. No bowel obstruction. Electronically Signed   By: Genevive Bi M.D.   On: 04/09/2018 23:47       Assessment & Plan:    Active Problems:   Intractable nausea and vomiting   1. Intractable nausea vomiting-unclear etiology,?  Gastritis , CT abdomen/pelvis is unremarkable, lab work showed no significant abnormality, patient does have history of small hiatal hernia, she takes Protonix every day.  She was given IV Pepcid in the ED.  Zofran for nausea vomiting.  Will continue with Zofran 4 mg IV every 6 hours as needed, Protonix 40 mg p.o. daily  2. Hypertension-continue amlodipine 5 mg daily, patient's med reconciliation is not complete as patient does not have list with her.  Her son is going to get medications list in the a.m.  Will start hydralazine 25 mg p.o. every 6 hours PRN for BP greater than 160/100  3. CAD status post CABG-patient is not taking aspirin as per patient.  4. Dementia-continue Namenda   DVT Prophylaxis-   Lovenox   AM Labs Ordered, also please review Full Orders  Family Communication:  Admission, patients condition and plan of care including tests being ordered have been discussed with the patient and her son and daughter-in-law at bedside* who indicate understanding and agree with the plan and Code Status.  Code Status: DNR  Admission status: Observation: Based on patients clinical presentation and evaluation of above clinical data, I have made determination that patient will need less than 2 midnight stay in the hospital.  Time spent in minutes : 60 minutes   Meredeth Ide M.D on 04/10/2018 at 12:47 AM

## 2018-04-10 NOTE — Progress Notes (Signed)
PROGRESS NOTE  Gloria Rowland ZOX:[id number] DOB: 1932/06/13 DOA: 04/09/2018 PCP: Ignatius SpeckingVyas, Dhruv B, MD  Brief History:  85 year old female with a history of coronary artery disease, hyperlipidemia, hypertension, dementia presenting with 1 day history of nausea and vomiting.  Unfortunately, the patient's history is not completely reliable secondary to her cognitive impairment.  The family members in the room were able to tell me generalities but were not able to provide any significant details.  There was a question whether the patient hematemesis, but the patient's family could not validate this.  Nevertheless, the patient had denied any fevers, chills, chest pain, shortness breath, diarrhea, hematochezia.  She did complain of some epigastric abdominal pain that began around the same time.  The patient had been in her usual state of health until the development of abdominal pain on 04/09/2018.  There is no dysuria hematuria, hematochezia, melena.  There is been no new medications. In the emergency department, the patient was noted to have some tachycardia but she was afebrile hemodynamically stable.  She was saturating 100% on room air.  CT of the abdomen and pelvis showed mild intrahepatic and moderate extrahepatic duct dilatation status post cholecystectomy.  There was an unchanged cystic lesion in the head of the pancreas.  She also has chronic left hydronephrosis.  Unfortunately, the patient has continued to have emesis and has been having difficulty tolerating liquids.  Assessment/Plan: Intractable nausea and vomiting -Etiology not completely clear but suspect nonspecific viral gastritis -Start Zofran around-the-clock -IV fluids -N.p.o. except for ice chips for now -IV PPI -Lipase 23  Essential hypertension -Continue amlodipine if the patient is able to tolerate p.o. -Hydralazine PRN SBP >180  Hyperlipidemia -Restart statin when the patient is able to tolerate p.o.  Coronary  artery disease -No chest pain presently  Dementia without behavioral disturbance -Continue Namenda  Left-sided hydronephrosis -This appears to be chronic due to left UPJ stricture -The patient follows with urology, Dr. Alvester MorinBell    Disposition Plan:   Home in 1-2 days  Family Communication:   Brother updated at bedside--Total time spent 35 minutes.  Greater than 50% spent face to face counseling and coordinating care. [phone]   Consultants: None  Code Status: DNR  DVT Prophylaxis: [place] Lovenox   Procedures: As Listed in Progress Note Above  Antibiotics: None  .dt    Subjective: Patient continues to have nausea and vomiting.  She is not able to tolerate clear liquids right now.  She denies any chest pain, shortness of breath, headache or neck pain.  She has some epigastric abdominal pain.  There is no diarrhea, hematochezia, dysuria, hematuria.  Objective: Vitals:   04/10/18 0030 04/10/18 0100 04/10/18 0125 04/10/18 0603  BP:  (!) 147/57 (!) 174/76 (!) 173/74  Pulse:   (!) 59 (!) 58  Resp: 14  20 20   Temp:   98.5 F (36.9 C) 98.9 F (37.2 C)  TempSrc:   Oral Oral  SpO2:   100% 100%  Weight:   51.7 kg   Height:   5\' 4"  (1.626 m)     Intake/Output Summary (Last 24 hours) at 04/10/2018 1159 Last data filed at 04/10/2018 0926 Gross per 24 hour  Intake 905.23 ml  Output 200 ml  Net 705.23 ml   Weight change:  Exam:   General:  Pt is alert, follows commands appropriately, not in acute distress  HEENT: No icterus, No thrush, No neck mass, Gerty/AT  Cardiovascular: RRR, S1/S2,  no rubs, no gallops  Respiratory: CTA bilaterally, no wheezing, no crackles, no rhonchi  Abdomen: Soft/+BS, epigastric and right upper quadrant tender, non distended, no guarding  Extremities: No edema, No lymphangitis, No petechiae, No rashes, no synovitis   Data Reviewed: I have personally reviewed following labs and imaging studies Basic Metabolic Panel: Recent Labs  Lab  04/09/18 2142 04/09/18 2147 04/10/18 0643  NA 136  --  134*  K 3.6  --  3.6  CL 103  --  103  CO2 20*  --  22  GLUCOSE 160*  --  134*  BUN 20  --  16  CREATININE 1.06* 1.10* 0.94  CALCIUM 9.6  --  8.6*   Liver Function Tests: Recent Labs  Lab 04/09/18 2142 04/10/18 0643  AST 26 20  ALT 17 14  ALKPHOS 59 51  BILITOT 0.4 0.5  PROT 7.4 6.1*  ALBUMIN 4.6 3.8   Recent Labs  Lab 04/09/18 2142  LIPASE 23   No results for input(s): AMMONIA in the last 168 hours. Coagulation Profile: No results for input(s): INR, PROTIME in the last 168 hours. CBC: Recent Labs  Lab 04/09/18 2142 04/10/18 0643  WBC 4.9 4.9  NEUTROABS 3.4  --   HGB 11.5* 10.1*  HCT 34.5* 30.7*  MCV 91.8 94.5  PLT 216 205   Cardiac Enzymes: No results for input(s): CKTOTAL, CKMB, CKMBINDEX, TROPONINI in the last 168 hours. BNP: Invalid input(s): POCBNP CBG: No results for input(s): GLUCAP in the last 168 hours. HbA1C: No results for input(s): HGBA1C in the last 72 hours. Urine analysis:    Component Value Date/Time   COLORURINE YELLOW 04/09/2018 2353   APPEARANCEUR HAZY (A) 04/09/2018 2353   LABSPEC 1.016 04/09/2018 2353   PHURINE 7.0 04/09/2018 2353   GLUCOSEU NEGATIVE 04/09/2018 2353   HGBUR NEGATIVE 04/09/2018 2353   BILIRUBINUR NEGATIVE 04/09/2018 2353   KETONESUR 5 (A) 04/09/2018 2353   PROTEINUR NEGATIVE 04/09/2018 2353   NITRITE NEGATIVE 04/09/2018 2353   LEUKOCYTESUR TRACE (A) 04/09/2018 2353   Sepsis Labs: @LABRCNTIP (procalcitonin:4,lacticidven:4) )No results found for this or any previous visit (from the past 240 hour(s)).   Scheduled Meds: . amLODipine  5 mg Oral Daily  . citalopram  10 mg Oral Daily  . [START ON 04/11/2018] enoxaparin (LOVENOX) injection  40 mg Subcutaneous Q24H  . memantine  10 mg Oral BID  . ondansetron (ZOFRAN) IV  4 mg Intravenous Q6H  . pantoprazole (PROTONIX) IV  40 mg Intravenous Q12H  . rosuvastatin  10 mg Oral Daily  . trimethoprim  100 mg Oral  Daily   Continuous Infusions: . 0.9 % NaCl with KCl 20 mEq / L 75 mL/hr at 04/10/18 1056    Procedures/Studies: Ct Abdomen Pelvis W Contrast  Result Date: 04/09/2018 CLINICAL DATA:  abd pain, epigastric pain, back pain, emesis x 3 episodes starting today, denies diarrhea, hypertensive en route, pt report she has not taken BP today Abdominal distention^118mL ISOVUE-300 IOPAMIDOL (ISOVUE-300) INJECTION 61%Abd distension EXAM: CT ABDOMEN AND PELVIS WITH CONTRAST TECHNIQUE: Multidetector CT imaging of the abdomen and pelvis was performed using the standard protocol following bolus administration of intravenous contrast. CONTRAST:  ISOVUE-300 IOPAMIDOL (ISOVUE-300) INJECTION 61% COMPARISON:  CT 12/07/2017, 08/20/2017 FINDINGS: Lower chest: Lung bases are clear. Hepatobiliary: Postcholecystectomy. Mild intrahepatic and moderate extrahepatic duct dilatation similar to comparison exam. Common bile duct measures 7 mm through the pancreatic head unchanged from 10 mm on prior. Pancreas: Cystic lesion head of the pancreas measuring 18 by 11  mm (image 19/2) is not changed from 19 mm x 10 mm on comparison exam from 08/20/2017. No ductal dilatation in the pancreatic body or tail. Spleen: Normal spleen Adrenals/urinary tract: Adrenal glands normal. There is chronic hydronephrosis of the LEFT kidney and LEFT renal pelvis. Findings consistent chronic UPJ obstruction. Distal LEFT ureter normal. RIGHT kidney normal. Bladder normal Stomach/Bowel: Stomach, duodenum, small-bowel normal. Cecum normal. Appendix not identified. Ascending, transverse and descending colon normal. Rectum normal Vascular/Lymphatic: Abdominal aorta is normal caliber with atherosclerotic calcification. There is no retroperitoneal or periportal lymphadenopathy. No pelvic lymphadenopathy. Reproductive: Post hysterectomy. Other: No free fluid. Musculoskeletal: No aggressive osseous lesion. IMPRESSION: 1. No clear acute abdominopelvic findings. 2.  Chronic dilatation of the extrahepatic bile ducts following cholecystectomy. 3. Chronic LEFT UPJ obstruction. 4. Stable small cystic lesion towards the head of the pancreas. 5. No bowel obstruction. Electronically Signed   By: Genevive Bi M.D.   On: 04/09/2018 23:47    Catarina Hartshorn, DO  [facility] Pager [phone]  If 7PM-7AM, please contact night-coverage [email or web address] Password [email or web address] 04/10/2018, 11:59 AM   LOS: 0 days

## 2018-04-11 DIAGNOSIS — Z66 Do not resuscitate: Secondary | ICD-10-CM | POA: Diagnosis present

## 2018-04-11 DIAGNOSIS — R1010 Upper abdominal pain, unspecified: Secondary | ICD-10-CM | POA: Diagnosis not present

## 2018-04-11 DIAGNOSIS — K449 Diaphragmatic hernia without obstruction or gangrene: Secondary | ICD-10-CM | POA: Diagnosis present

## 2018-04-11 DIAGNOSIS — R000 Tachycardia, unspecified: Secondary | ICD-10-CM | POA: Diagnosis present

## 2018-04-11 DIAGNOSIS — R112 Nausea with vomiting, unspecified: Secondary | ICD-10-CM | POA: Diagnosis not present

## 2018-04-11 DIAGNOSIS — F0390 Unspecified dementia without behavioral disturbance: Secondary | ICD-10-CM | POA: Diagnosis present

## 2018-04-11 DIAGNOSIS — Z9049 Acquired absence of other specified parts of digestive tract: Secondary | ICD-10-CM | POA: Diagnosis not present

## 2018-04-11 DIAGNOSIS — Z7982 Long term (current) use of aspirin: Secondary | ICD-10-CM | POA: Diagnosis not present

## 2018-04-11 DIAGNOSIS — Z885 Allergy status to narcotic agent status: Secondary | ICD-10-CM | POA: Diagnosis not present

## 2018-04-11 DIAGNOSIS — Z951 Presence of aortocoronary bypass graft: Secondary | ICD-10-CM | POA: Diagnosis not present

## 2018-04-11 DIAGNOSIS — I10 Essential (primary) hypertension: Secondary | ICD-10-CM | POA: Diagnosis present

## 2018-04-11 DIAGNOSIS — R4189 Other symptoms and signs involving cognitive functions and awareness: Secondary | ICD-10-CM | POA: Diagnosis present

## 2018-04-11 DIAGNOSIS — A084 Viral intestinal infection, unspecified: Secondary | ICD-10-CM | POA: Diagnosis present

## 2018-04-11 DIAGNOSIS — K862 Cyst of pancreas: Secondary | ICD-10-CM | POA: Diagnosis present

## 2018-04-11 DIAGNOSIS — E785 Hyperlipidemia, unspecified: Secondary | ICD-10-CM | POA: Diagnosis present

## 2018-04-11 DIAGNOSIS — N1330 Unspecified hydronephrosis: Secondary | ICD-10-CM | POA: Diagnosis present

## 2018-04-11 DIAGNOSIS — Z79899 Other long term (current) drug therapy: Secondary | ICD-10-CM | POA: Diagnosis not present

## 2018-04-11 DIAGNOSIS — Z886 Allergy status to analgesic agent status: Secondary | ICD-10-CM | POA: Diagnosis not present

## 2018-04-11 DIAGNOSIS — I2510 Atherosclerotic heart disease of native coronary artery without angina pectoris: Secondary | ICD-10-CM | POA: Diagnosis present

## 2018-04-11 LAB — BASIC METABOLIC PANEL
Anion gap: 7 (ref 5–15)
BUN: 13 mg/dL (ref 8–23)
CO2: 21 mmol/L — AB (ref 22–32)
Calcium: 8.1 mg/dL — ABNORMAL LOW (ref 8.9–10.3)
Chloride: 111 mmol/L (ref 98–111)
Creatinine, Ser: 0.99 mg/dL (ref 0.44–1.00)
GFR calc non Af Amer: 53 mL/min — ABNORMAL LOW (ref >60)
Glucose, Bld: 93 mg/dL (ref 70–99)
Potassium: 3.6 mmol/L (ref 3.5–5.1)
Sodium: 139 mmol/L (ref 135–145)

## 2018-04-11 LAB — MAGNESIUM: Magnesium: 1.9 mg/dL (ref 1.7–2.4)

## 2018-04-11 NOTE — Progress Notes (Signed)
PROGRESS NOTE  ELVIS ARRELLANO CBU:[id number] DOB: March 16, 1933 DOA: 04/09/2018 PCP: Ignatius Specking, MD  Brief History:  85 year old female with a history of coronary artery disease, hyperlipidemia, hypertension, dementia presenting with 1 day history of nausea and vomiting.  Unfortunately, the patient's history is not completely reliable secondary to her cognitive impairment.  The family members in the room were able to tell me generalities but were not able to provide any significant details.  There was a question whether the patient hematemesis, but the patient's family could not validate this.  Nevertheless, the patient had denied any fevers, chills, chest pain, shortness breath, diarrhea, hematochezia.  She did complain of some epigastric abdominal pain that began around the same time.  The patient had been in her usual state of health until the development of abdominal pain on 04/09/2018.  There is no dysuria hematuria, hematochezia, melena.  There is been no new medications. In the emergency department, the patient was noted to have some tachycardia but she was afebrile hemodynamically stable.  She was saturating 100% on room air.  CT of the abdomen and pelvis showed mild intrahepatic and moderate extrahepatic duct dilatation status post cholecystectomy.  There was an unchanged cystic lesion in the head of the pancreas.  She also has chronic left hydronephrosis.  Unfortunately, the patient has continued to have emesis and has been having difficulty tolerating liquids.  Assessment/Plan: Intractable nausea and vomiting -Etiology not completely clear but suspect nonspecific viral gastritis -Continue Zofran around-the-clock -IV fluids -advance to full liquids -IV PPI -Lipase 23  Essential hypertension -Continue amlodipine if the patient is able to tolerate p.o. -Hydralazine PRN SBP >180  Hyperlipidemia -Restart statin when the patient is able to tolerate p.o.  Coronary artery  disease -No chest pain presently  Dementia without behavioral disturbance -Continue Namenda  Left-sided hydronephrosis -This appears to be chronic due to left UPJ stricture -The patient follows with urology, Dr. Alvester Morin    Disposition Plan:   Home 1/21 if stable Family Communication:   Brother updated at bedside   Consultants: None  Code Status: DNR  DVT Prophylaxis: [place] Lovenox   Procedures: As Listed in Progress Note Above  Antibiotics: None    Subjective: Patient denies fevers, chills, headache, chest pain, dyspnea, nausea, vomiting, diarrhea, abdominal pain, dysuria, hematuria, hematochezia, and melena.   Objective: Vitals:   04/10/18 0603 04/10/18 1458 04/10/18 2111 04/11/18 0614  BP: (!) 173/74 (!) 145/62 (!) 129/50 (!) 141/87  Pulse: (!) 58 (!) 58 73 (!) 59  Resp: 20 18 18 18   Temp: 98.9 F (37.2 C) 98.8 F (37.1 C) 98.3 F (36.8 C) 98.7 F (37.1 C)  TempSrc: Oral Oral Axillary Oral  SpO2: 100% 100% 99% 100%  Weight:      Height:        Intake/Output Summary (Last 24 hours) at 04/11/2018 1038 Last data filed at 04/11/2018 0618 Gross per 24 hour  Intake 493.78 ml  Output 1700 ml  Net -1206.22 ml   Weight change:  Exam:   General:  Pt is alert, follows commands appropriately, not in acute distress  HEENT: No icterus, No thrush, No neck mass, [place]/AT  Cardiovascular: RRR, S1/S2, no rubs, no gallops  Respiratory: CTA bilaterally, no wheezing, no crackles, no rhonchi  Abdomen: Soft/+BS, non tender, non distended, no guarding  Extremities: No edema, No lymphangitis, No petechiae, No rashes, no synovitis   Data Reviewed: I have personally reviewed following labs and  imaging studies Basic Metabolic Panel: Recent Labs  Lab 04/09/18 2142 04/09/18 2147 04/10/18 0643 04/11/18 0647  NA 136  --  134* 139  K 3.6  --  3.6 3.6  CL 103  --  103 111  CO2 20*  --  22 21*  GLUCOSE 160*  --  134* 93  BUN 20  --  16 13  CREATININE 1.06*  1.10* 0.94 0.99  CALCIUM 9.6  --  8.6* 8.1*  MG  --   --   --  1.9   Liver Function Tests: Recent Labs  Lab 04/09/18 2142 04/10/18 0643  AST 26 20  ALT 17 14  ALKPHOS 59 51  BILITOT 0.4 0.5  PROT 7.4 6.1*  ALBUMIN 4.6 3.8   Recent Labs  Lab 04/09/18 2142  LIPASE 23   No results for input(s): AMMONIA in the last 168 hours. Coagulation Profile: No results for input(s): INR, PROTIME in the last 168 hours. CBC: Recent Labs  Lab 04/09/18 2142 04/10/18 0643  WBC 4.9 4.9  NEUTROABS 3.4  --   HGB 11.5* 10.1*  HCT 34.5* 30.7*  MCV 91.8 94.5  PLT 216 205   Cardiac Enzymes: No results for input(s): CKTOTAL, CKMB, CKMBINDEX, TROPONINI in the last 168 hours. BNP: Invalid input(s): POCBNP CBG: No results for input(s): GLUCAP in the last 168 hours. HbA1C: No results for input(s): HGBA1C in the last 72 hours. Urine analysis:    Component Value Date/Time   COLORURINE YELLOW 04/09/2018 2353   APPEARANCEUR HAZY (A) 04/09/2018 2353   LABSPEC 1.016 04/09/2018 2353   PHURINE 7.0 04/09/2018 2353   GLUCOSEU NEGATIVE 04/09/2018 2353   HGBUR NEGATIVE 04/09/2018 2353   BILIRUBINUR NEGATIVE 04/09/2018 2353   KETONESUR 5 (A) 04/09/2018 2353   PROTEINUR NEGATIVE 04/09/2018 2353   NITRITE NEGATIVE 04/09/2018 2353   LEUKOCYTESUR TRACE (A) 04/09/2018 2353   Sepsis Labs: @LABRCNTIP (procalcitonin:4,lacticidven:4) )No results found for this or any previous visit (from the past 240 hour(s)).   Scheduled Meds: . amLODipine  5 mg Oral Daily  . citalopram  10 mg Oral Daily  . enoxaparin (LOVENOX) injection  40 mg Subcutaneous Q24H  . memantine  10 mg Oral BID  . ondansetron (ZOFRAN) IV  4 mg Intravenous Q6H  . pantoprazole (PROTONIX) IV  40 mg Intravenous Q12H  . rosuvastatin  10 mg Oral Daily  . trimethoprim  100 mg Oral Daily   Continuous Infusions: . 0.9 % NaCl with KCl 20 mEq / L 75 mL/hr at 04/11/18 1014    Procedures/Studies: Ct Abdomen Pelvis W Contrast  Result Date:  04/09/2018 CLINICAL DATA:  abd pain, epigastric pain, back pain, emesis x 3 episodes starting today, denies diarrhea, hypertensive en route, pt report she has not taken BP today Abdominal distention^16800mL ISOVUE-300 IOPAMIDOL (ISOVUE-300) INJECTION 61%Abd distension EXAM: CT ABDOMEN AND PELVIS WITH CONTRAST TECHNIQUE: Multidetector CT imaging of the abdomen and pelvis was performed using the standard protocol following bolus administration of intravenous contrast. CONTRAST:  100mL ISOVUE-300 IOPAMIDOL (ISOVUE-300) INJECTION 61% COMPARISON:  CT 12/07/2017, 08/20/2017 FINDINGS: Lower chest: Lung bases are clear. Hepatobiliary: Postcholecystectomy. Mild intrahepatic and moderate extrahepatic duct dilatation similar to comparison exam. Common bile duct measures 7 mm through the pancreatic head unchanged from 10 mm on prior. Pancreas: Cystic lesion head of the pancreas measuring 18 by 11 mm (image 19/2) is not changed from 19 mm x 10 mm on comparison exam from 08/20/2017. No ductal dilatation in the pancreatic body or tail. Spleen: Normal spleen Adrenals/urinary tract:  Adrenal glands normal. There is chronic hydronephrosis of the LEFT kidney and LEFT renal pelvis. Findings consistent chronic UPJ obstruction. Distal LEFT ureter normal. RIGHT kidney normal. Bladder normal Stomach/Bowel: Stomach, duodenum, small-bowel normal. Cecum normal. Appendix not identified. Ascending, transverse and descending colon normal. Rectum normal Vascular/Lymphatic: Abdominal aorta is normal caliber with atherosclerotic calcification. There is no retroperitoneal or periportal lymphadenopathy. No pelvic lymphadenopathy. Reproductive: Post hysterectomy. Other: No free fluid. Musculoskeletal: No aggressive osseous lesion. IMPRESSION: 1. No clear acute abdominopelvic findings. 2. Chronic dilatation of the extrahepatic bile ducts following cholecystectomy. 3. Chronic LEFT UPJ obstruction. 4. Stable small cystic lesion towards the head of the  pancreas. 5. No bowel obstruction. Electronically Signed   By: Genevive BiStewart  Edmunds M.D.   On: 04/09/2018 23:47    Catarina Hartshornavid Theoren Palka, DO  [facility] Pager [phone]  If 7PM-7AM, please contact night-coverage [email or web address] Password [email or web address] 04/11/2018, 10:38 AM   LOS: 0 days

## 2018-04-12 LAB — BASIC METABOLIC PANEL
Anion gap: 5 (ref 5–15)
BUN: 15 mg/dL (ref 8–23)
CO2: 23 mmol/L (ref 22–32)
CREATININE: 1.09 mg/dL — AB (ref 0.44–1.00)
Calcium: 8.1 mg/dL — ABNORMAL LOW (ref 8.9–10.3)
Chloride: 111 mmol/L (ref 98–111)
GFR calc Af Amer: 54 mL/min — ABNORMAL LOW (ref >60)
GFR calc non Af Amer: 47 mL/min — ABNORMAL LOW (ref >60)
Glucose, Bld: 97 mg/dL (ref 70–99)
Potassium: 3.7 mmol/L (ref 3.5–5.1)
Sodium: 139 mmol/L (ref 135–145)

## 2018-04-12 LAB — MAGNESIUM: Magnesium: 1.8 mg/dL (ref 1.7–2.4)

## 2018-04-12 MED ORDER — ONDANSETRON 4 MG PO TBDP: 4.0000 mg | ORAL_TABLET | Freq: Three times a day (TID) | ORAL | 0 refills | Status: DC | PRN

## 2018-04-12 MED ORDER — PANTOPRAZOLE SODIUM 40 MG PO TBEC: 40.0000 mg | DELAYED_RELEASE_TABLET | Freq: Every day | ORAL | 1 refills | Status: DC

## 2018-04-12 NOTE — Discharge Summary (Signed)
Physician Discharge Summary  Gloria KosBetty L Rowland NWG:[id number] DOB: 1932-12-10 DOA: 04/09/2018  PCP: Gloria SpeckingVyas, Dhruv B, MD  Admit date: 04/09/2018 Discharge date: 04/12/2018  Admitted From: Home Disposition:  Home   Recommendations for Outpatient Follow-up:  1. Follow up with PCP in 1-2 weeks 2. Please obtain BMP/CBC in one week   Discharge Condition: Stable CODE STATUS:DNR Diet recommendation: Heart Healthy   Brief/Interim Summary: 85 year old female with a history of coronary artery disease, hyperlipidemia, hypertension, dementia presenting with 1 day history of nausea and vomiting. Unfortunately, the patient's history is not completely reliable secondary to her cognitive impairment. The family members in the room were able to tell me generalities but were not able to provide any significant details. There was a question whether the patient hematemesis, but the patient's family could not validate this. Nevertheless, the patient had denied any fevers, chills, chest pain, shortness breath, diarrhea, hematochezia. She did complain of some epigastric abdominal pain that began around the same time. The patient had been in her usual state of health until the development of abdominal pain on 04/09/2018. There is no dysuria hematuria, hematochezia, melena.There is been no new medications. In the emergency department, the patient was noted to have some tachycardia but she was afebrile hemodynamically stable. She was saturating 100% on room air. CT of the abdomen and pelvis showed mild intrahepatic and moderate extrahepatic duct dilatation status post cholecystectomy. There was an unchanged cystic lesion in the head of the pancreas. She also has chronic left hydronephrosis. Unfortunately, the patient has continued to have emesis and has been having difficulty tolerating liquids.  Patient was initially placed on bowel rest and Zofran around-the-clock.  She gradually improved.  Her diet was  gradually advanced which she tolerated as her nausea and vomiting improved.  It was felt that this was likely due to nonspecific viral gastritis.  Discharge Diagnoses:  Intractable nausea and vomiting -due to nonspecific viral gastritis -Continue Zofran around-the-clock -IV fluids when unable to tolerate diet -advance to full liquids>>>soft diet -pt tolerated diet -IV PPI -Lipase 23  Essential hypertension -Continue amlodipine if the patient is able to tolerate p.o.>>> resume after discharge -Resume lisinopril -Hydralazine PRNSBP >180  Hyperlipidemia -Restart statin when the patient is able to tolerate p.o.>>> resume after discharge  Coronary artery disease -No chest pain presently  Dementia without behavioral disturbance -Continue Namenda  Left-sided hydronephrosis -This appears to be chronic due to left UPJ stricture -The patient follows with urology, Dr. Alvester MorinBell   Discharge Instructions   Allergies as of 04/12/2018      Reactions   Aspirin Nausea And Vomiting   Upset stomach   Codeine Nausea And Vomiting   Upset stomach      Medication List    STOP taking these medications   cefUROXime 500 MG tablet Commonly known as:  CEFTIN   cephALEXin 500 MG capsule Commonly known as:  KEFLEX     TAKE these medications   acetaminophen 650 MG CR tablet Commonly known as:  TYLENOL Take 650 mg by mouth every 6 (six) hours as needed for pain.   amLODipine 5 MG tablet Commonly known as:  NORVASC Take 5 mg by mouth daily.   aspirin EC 81 MG tablet Take 81 mg by mouth daily.   citalopram 10 MG tablet Commonly known as:  CELEXA Take 10 mg by mouth daily.   GEMFIBROZIL PO Take 300 mg by mouth 2 (two) times daily.   HYDROcodone-acetaminophen 5-325 MG tablet Commonly known as:  NORCO/VICODIN Take 1  tablet by mouth every 6 (six) hours as needed.   lisinopril 20 MG tablet Commonly known as:  PRINIVIL,ZESTRIL Take 20 mg by mouth 2 (two) times daily.     meclizine 12.5 MG tablet Commonly known as:  ANTIVERT Take 12.5 mg by mouth as needed for dizziness.   memantine 10 MG tablet Commonly known as:  NAMENDA Take 10 mg by mouth 2 (two) times daily.   ondansetron 4 MG disintegrating tablet Commonly known as:  ZOFRAN ODT Take 1 tablet (4 mg total) by mouth every 8 (eight) hours as needed for nausea or vomiting.   ondansetron 4 MG tablet Commonly known as:  ZOFRAN Take 1 tablet (4 mg total) by mouth every 8 (eight) hours as needed.   pantoprazole 40 MG tablet Commonly known as:  PROTONIX Take 20 mg by mouth 2 (two) times daily.   polyethylene glycol powder powder Commonly known as:  GLYCOLAX/MIRALAX Take 17 g by mouth daily as needed. For constipation.   ROBITUSSIN COUGH+CHEST CONG DM 5-100 MG/5ML Liqd Generic drug:  Dextromethorphan-guaiFENesin Give 10 ml as needed for cough every 4 hours prn   rosuvastatin 10 MG tablet Commonly known as:  CRESTOR Take 10 mg by mouth daily.   trimethoprim 100 MG tablet Commonly known as:  TRIMPEX Take 100 mg by mouth daily.   Vitamin D (Ergocalciferol) 1.25 MG (50000 UT) Caps capsule Commonly known as:  DRISDOL Take 50,000 Units by mouth every 7 (seven) days.       Allergies  Allergen Reactions  . Aspirin Nausea And Vomiting    Upset stomach  . Codeine Nausea And Vomiting    Upset stomach    Consultations:  none   Procedures/Studies: Ct Abdomen Pelvis W Contrast  Result Date: 04/09/2018 CLINICAL DATA:  abd pain, epigastric pain, back pain, emesis x 3 episodes starting today, denies diarrhea, hypertensive en route, pt report she has not taken BP today Abdominal distention^122mL ISOVUE-300 IOPAMIDOL (ISOVUE-300) INJECTION 61%Abd distension EXAM: CT ABDOMEN AND PELVIS WITH CONTRAST TECHNIQUE: Multidetector CT imaging of the abdomen and pelvis was performed using the standard protocol following bolus administration of intravenous contrast. CONTRAST:  ISOVUE-300 IOPAMIDOL  (ISOVUE-300) INJECTION 61% COMPARISON:  CT 12/07/2017, 08/20/2017 FINDINGS: Lower chest: Lung bases are clear. Hepatobiliary: Postcholecystectomy. Mild intrahepatic and moderate extrahepatic duct dilatation similar to comparison exam. Common bile duct measures 7 mm through the pancreatic head unchanged from 10 mm on prior. Pancreas: Cystic lesion head of the pancreas measuring 18 by 11 mm (image 19/2) is not changed from 19 mm x 10 mm on comparison exam from 08/20/2017. No ductal dilatation in the pancreatic body or tail. Spleen: Normal spleen Adrenals/urinary tract: Adrenal glands normal. There is chronic hydronephrosis of the LEFT kidney and LEFT renal pelvis. Findings consistent chronic UPJ obstruction. Distal LEFT ureter normal. RIGHT kidney normal. Bladder normal Stomach/Bowel: Stomach, duodenum, small-bowel normal. Cecum normal. Appendix not identified. Ascending, transverse and descending colon normal. Rectum normal Vascular/Lymphatic: Abdominal aorta is normal caliber with atherosclerotic calcification. There is no retroperitoneal or periportal lymphadenopathy. No pelvic lymphadenopathy. Reproductive: Post hysterectomy. Other: No free fluid. Musculoskeletal: No aggressive osseous lesion. IMPRESSION: 1. No clear acute abdominopelvic findings. 2. Chronic dilatation of the extrahepatic bile ducts following cholecystectomy. 3. Chronic LEFT UPJ obstruction. 4. Stable small cystic lesion towards the head of the pancreas. 5. No bowel obstruction. Electronically Signed   By: Genevive Bi M.D.   On: 04/09/2018 23:47         Discharge Exam: Vitals:   04/11/18  2146 04/12/18 0528  BP: (!) 137/59 (!) 144/58  Pulse: (!) 47 (!) 48  Resp: 20 18  Temp: 98.3 F (36.8 C) 98 F (36.7 C)  SpO2: 98% 97%   Vitals:   04/11/18 0614 04/11/18 1357 04/11/18 2146 04/12/18 0528  BP: (!) 141/87 (!) 150/67 (!) 137/59 (!) 144/58  Pulse: (!) 59 (!) 56 (!) 47 (!) 48  Resp: 18 18 20 18   Temp: 98.7 F (37.1 C) 98.9  F (37.2 C) 98.3 F (36.8 C) 98 F (36.7 C)  TempSrc: Oral Oral Oral Oral  SpO2: 100% 100% 98% 97%  Weight:      Height:        General: Pt is alert, awake, not in acute distress Cardiovascular: RRR, S1/S2 +, no rubs, no gallops Respiratory: CTA bilaterally, no wheezing, no rhonchi Abdominal: Soft, NT, ND, bowel sounds + Extremities: no edema, no cyanosis   The results of significant diagnostics from this hospitalization (including imaging, microbiology, ancillary and laboratory) are listed below for reference.    Significant Diagnostic Studies: Ct Abdomen Pelvis W Contrast  Result Date: 04/09/2018 CLINICAL DATA:  abd pain, epigastric pain, back pain, emesis x 3 episodes starting today, denies diarrhea, hypertensive en route, pt report she has not taken BP today Abdominal distention^159mL ISOVUE-300 IOPAMIDOL (ISOVUE-300) INJECTION 61%Abd distension EXAM: CT ABDOMEN AND PELVIS WITH CONTRAST TECHNIQUE: Multidetector CT imaging of the abdomen and pelvis was performed using the standard protocol following bolus administration of intravenous contrast. CONTRAST:  ISOVUE-300 IOPAMIDOL (ISOVUE-300) INJECTION 61% COMPARISON:  CT 12/07/2017, 08/20/2017 FINDINGS: Lower chest: Lung bases are clear. Hepatobiliary: Postcholecystectomy. Mild intrahepatic and moderate extrahepatic duct dilatation similar to comparison exam. Common bile duct measures 7 mm through the pancreatic head unchanged from 10 mm on prior. Pancreas: Cystic lesion head of the pancreas measuring 18 by 11 mm (image 19/2) is not changed from 19 mm x 10 mm on comparison exam from 08/20/2017. No ductal dilatation in the pancreatic body or tail. Spleen: Normal spleen Adrenals/urinary tract: Adrenal glands normal. There is chronic hydronephrosis of the LEFT kidney and LEFT renal pelvis. Findings consistent chronic UPJ obstruction. Distal LEFT ureter normal. RIGHT kidney normal. Bladder normal Stomach/Bowel: Stomach, duodenum, small-bowel  normal. Cecum normal. Appendix not identified. Ascending, transverse and descending colon normal. Rectum normal Vascular/Lymphatic: Abdominal aorta is normal caliber with atherosclerotic calcification. There is no retroperitoneal or periportal lymphadenopathy. No pelvic lymphadenopathy. Reproductive: Post hysterectomy. Other: No free fluid. Musculoskeletal: No aggressive osseous lesion. IMPRESSION: 1. No clear acute abdominopelvic findings. 2. Chronic dilatation of the extrahepatic bile ducts following cholecystectomy. 3. Chronic LEFT UPJ obstruction. 4. Stable small cystic lesion towards the head of the pancreas. 5. No bowel obstruction. Electronically Signed   By: Genevive Bi M.D.   On: 04/09/2018 23:47     Microbiology: No results found for this or any previous visit (from the past 240 hour(s)).   Labs: Basic Metabolic Panel: Recent Labs  Lab 04/09/18 2142 04/09/18 2147 04/10/18 0643 04/11/18 0647 04/12/18 0646  NA 136  --  134* 139 139  K 3.6  --  3.6 3.6 3.7  CL 103  --  103 111 111  CO2 20*  --  22 21* 23  GLUCOSE 160*  --  134* 93 97  BUN 20  --  16 13 15   CREATININE 1.06* 1.10* 0.94 0.99 1.09*  CALCIUM 9.6  --  8.6* 8.1* 8.1*  MG  --   --   --  1.9 1.8   Liver  Function Tests: Recent Labs  Lab 04/09/18 2142 04/10/18 0643  AST 26 20  ALT 17 14  ALKPHOS 59 51  BILITOT 0.4 0.5  PROT 7.4 6.1*  ALBUMIN 4.6 3.8   Recent Labs  Lab 04/09/18 2142  LIPASE 23   No results for input(s): AMMONIA in the last 168 hours. CBC: Recent Labs  Lab 04/09/18 2142 04/10/18 0643  WBC 4.9 4.9  NEUTROABS 3.4  --   HGB 11.5* 10.1*  HCT 34.5* 30.7*  MCV 91.8 94.5  PLT 216 205   Cardiac Enzymes: No results for input(s): CKTOTAL, CKMB, CKMBINDEX, TROPONINI in the last 168 hours. BNP: Invalid input(s): POCBNP CBG: No results for input(s): GLUCAP in the last 168 hours.  Time coordinating discharge:  36 minutes  Signed:  Catarina Hartshorn, DO [facility] Pager:  [phone] 04/12/2018, 1:55 PM

## 2018-04-12 NOTE — Progress Notes (Signed)
Gloria Rowland discharged Home per MD order.  Discharge instructions reviewed and discussed with the patient, all questions and concerns answered. Copy of instructions and scripts given to patient.  Allergies as of 04/12/2018      Reactions   Aspirin Nausea And Vomiting   Upset stomach   Codeine Nausea And Vomiting   Upset stomach      Medication List    STOP taking these medications   cefUROXime 500 MG tablet Commonly known as:  CEFTIN   cephALEXin 500 MG capsule Commonly known as:  KEFLEX   ondansetron 4 MG tablet Commonly known as:  ZOFRAN     TAKE these medications   acetaminophen 650 MG CR tablet Commonly known as:  TYLENOL Take 650 mg by mouth every 6 (six) hours as needed for pain.   amLODipine 5 MG tablet Commonly known as:  NORVASC Take 5 mg by mouth daily.   aspirin EC 81 MG tablet Take 81 mg by mouth daily.   citalopram 10 MG tablet Commonly known as:  CELEXA Take 10 mg by mouth daily.   GEMFIBROZIL PO Take 300 mg by mouth 2 (two) times daily.   HYDROcodone-acetaminophen 5-325 MG tablet Commonly known as:  NORCO/VICODIN Take 1 tablet by mouth every 6 (six) hours as needed.   lisinopril 20 MG tablet Commonly known as:  PRINIVIL,ZESTRIL Take 20 mg by mouth 2 (two) times daily.   meclizine 12.5 MG tablet Commonly known as:  ANTIVERT Take 12.5 mg by mouth as needed for dizziness.   memantine 10 MG tablet Commonly known as:  NAMENDA Take 10 mg by mouth 2 (two) times daily.   ondansetron 4 MG disintegrating tablet Commonly known as:  ZOFRAN ODT Take 1 tablet (4 mg total) by mouth every 8 (eight) hours as needed for nausea or vomiting.   pantoprazole 40 MG tablet Commonly known as:  PROTONIX Take 1 tablet (40 mg total) by mouth daily. What changed:    how much to take  when to take this   polyethylene glycol powder powder Commonly known as:  GLYCOLAX/MIRALAX Take 17 g by mouth daily as needed. For constipation.   ROBITUSSIN COUGH+CHEST  CONG DM 5-100 MG/5ML Liqd Generic drug:  Dextromethorphan-guaiFENesin Give 10 ml as needed for cough every 4 hours prn   rosuvastatin 10 MG tablet Commonly known as:  CRESTOR Take 10 mg by mouth daily.   trimethoprim 100 MG tablet Commonly known as:  TRIMPEX Take 100 mg by mouth daily.   Vitamin D (Ergocalciferol) 1.25 MG (50000 UT) Caps capsule Commonly known as:  DRISDOL Take 50,000 Units by mouth every 7 (seven) days.       Patients skin is clean, dry and intact, no evidence of skin break down. IV site discontinued and catheter remains intact. Site without signs and symptoms of complications. Dressing and pressure applied.  Patient escorted to car in a wheelchair,  no distress noted upon discharge.  Rica Koyanagi Taydon Nasworthy 04/12/2018 4:17 PM

## 2018-04-13 DIAGNOSIS — E7800 Pure hypercholesterolemia, unspecified: Secondary | ICD-10-CM | POA: Diagnosis not present

## 2018-04-13 DIAGNOSIS — I10 Essential (primary) hypertension: Secondary | ICD-10-CM | POA: Diagnosis not present

## 2018-04-13 DIAGNOSIS — F0390 Unspecified dementia without behavioral disturbance: Secondary | ICD-10-CM | POA: Diagnosis not present

## 2018-04-19 DIAGNOSIS — F321 Major depressive disorder, single episode, moderate: Secondary | ICD-10-CM | POA: Diagnosis not present

## 2018-04-19 DIAGNOSIS — Z299 Encounter for prophylactic measures, unspecified: Secondary | ICD-10-CM | POA: Diagnosis not present

## 2018-04-19 DIAGNOSIS — F0390 Unspecified dementia without behavioral disturbance: Secondary | ICD-10-CM | POA: Diagnosis not present

## 2018-04-19 DIAGNOSIS — I10 Essential (primary) hypertension: Secondary | ICD-10-CM | POA: Diagnosis not present

## 2018-04-19 DIAGNOSIS — Z6820 Body mass index (BMI) 20.0-20.9, adult: Secondary | ICD-10-CM | POA: Diagnosis not present

## 2018-04-19 DIAGNOSIS — K529 Noninfective gastroenteritis and colitis, unspecified: Secondary | ICD-10-CM | POA: Diagnosis not present

## 2018-05-11 DIAGNOSIS — I10 Essential (primary) hypertension: Secondary | ICD-10-CM | POA: Diagnosis not present

## 2018-05-11 DIAGNOSIS — F0390 Unspecified dementia without behavioral disturbance: Secondary | ICD-10-CM | POA: Diagnosis not present

## 2018-05-11 DIAGNOSIS — E7800 Pure hypercholesterolemia, unspecified: Secondary | ICD-10-CM | POA: Diagnosis not present

## 2018-05-17 DIAGNOSIS — I2510 Atherosclerotic heart disease of native coronary artery without angina pectoris: Secondary | ICD-10-CM | POA: Diagnosis not present

## 2018-05-17 DIAGNOSIS — Z6821 Body mass index (BMI) 21.0-21.9, adult: Secondary | ICD-10-CM | POA: Diagnosis not present

## 2018-05-17 DIAGNOSIS — Z299 Encounter for prophylactic measures, unspecified: Secondary | ICD-10-CM | POA: Diagnosis not present

## 2018-05-17 DIAGNOSIS — I10 Essential (primary) hypertension: Secondary | ICD-10-CM | POA: Diagnosis not present

## 2018-05-17 DIAGNOSIS — F321 Major depressive disorder, single episode, moderate: Secondary | ICD-10-CM | POA: Diagnosis not present

## 2018-05-17 DIAGNOSIS — F0390 Unspecified dementia without behavioral disturbance: Secondary | ICD-10-CM | POA: Diagnosis not present

## 2018-06-09 DIAGNOSIS — F0390 Unspecified dementia without behavioral disturbance: Secondary | ICD-10-CM | POA: Diagnosis not present

## 2018-06-09 DIAGNOSIS — I10 Essential (primary) hypertension: Secondary | ICD-10-CM | POA: Diagnosis not present

## 2018-06-09 DIAGNOSIS — E7800 Pure hypercholesterolemia, unspecified: Secondary | ICD-10-CM | POA: Diagnosis not present

## 2018-07-04 DIAGNOSIS — F0390 Unspecified dementia without behavioral disturbance: Secondary | ICD-10-CM | POA: Diagnosis not present

## 2018-07-04 DIAGNOSIS — I10 Essential (primary) hypertension: Secondary | ICD-10-CM | POA: Diagnosis not present

## 2018-07-04 DIAGNOSIS — F321 Major depressive disorder, single episode, moderate: Secondary | ICD-10-CM | POA: Diagnosis not present

## 2018-07-04 DIAGNOSIS — Z6820 Body mass index (BMI) 20.0-20.9, adult: Secondary | ICD-10-CM | POA: Diagnosis not present

## 2018-07-04 DIAGNOSIS — Z299 Encounter for prophylactic measures, unspecified: Secondary | ICD-10-CM | POA: Diagnosis not present

## 2018-07-04 DIAGNOSIS — J069 Acute upper respiratory infection, unspecified: Secondary | ICD-10-CM | POA: Diagnosis not present

## 2018-07-21 DIAGNOSIS — I10 Essential (primary) hypertension: Secondary | ICD-10-CM | POA: Diagnosis not present

## 2018-07-21 DIAGNOSIS — E7800 Pure hypercholesterolemia, unspecified: Secondary | ICD-10-CM | POA: Diagnosis not present

## 2018-07-21 DIAGNOSIS — F0390 Unspecified dementia without behavioral disturbance: Secondary | ICD-10-CM | POA: Diagnosis not present

## 2018-08-19 DIAGNOSIS — E7800 Pure hypercholesterolemia, unspecified: Secondary | ICD-10-CM | POA: Diagnosis not present

## 2018-08-19 DIAGNOSIS — I10 Essential (primary) hypertension: Secondary | ICD-10-CM | POA: Diagnosis not present

## 2018-08-19 DIAGNOSIS — F0390 Unspecified dementia without behavioral disturbance: Secondary | ICD-10-CM | POA: Diagnosis not present

## 2018-09-07 DIAGNOSIS — E7800 Pure hypercholesterolemia, unspecified: Secondary | ICD-10-CM | POA: Diagnosis not present

## 2018-09-07 DIAGNOSIS — I10 Essential (primary) hypertension: Secondary | ICD-10-CM | POA: Diagnosis not present

## 2018-09-07 DIAGNOSIS — F0390 Unspecified dementia without behavioral disturbance: Secondary | ICD-10-CM | POA: Diagnosis not present

## 2018-09-29 DIAGNOSIS — F0390 Unspecified dementia without behavioral disturbance: Secondary | ICD-10-CM | POA: Diagnosis not present

## 2018-09-29 DIAGNOSIS — I10 Essential (primary) hypertension: Secondary | ICD-10-CM | POA: Diagnosis not present

## 2018-09-29 DIAGNOSIS — E7800 Pure hypercholesterolemia, unspecified: Secondary | ICD-10-CM | POA: Diagnosis not present

## 2018-10-06 DIAGNOSIS — N130 Hydronephrosis with ureteropelvic junction obstruction: Secondary | ICD-10-CM | POA: Diagnosis not present

## 2018-10-06 DIAGNOSIS — N3021 Other chronic cystitis with hematuria: Secondary | ICD-10-CM | POA: Diagnosis not present

## 2018-10-10 DIAGNOSIS — Z6821 Body mass index (BMI) 21.0-21.9, adult: Secondary | ICD-10-CM | POA: Diagnosis not present

## 2018-10-10 DIAGNOSIS — F0390 Unspecified dementia without behavioral disturbance: Secondary | ICD-10-CM | POA: Diagnosis not present

## 2018-10-10 DIAGNOSIS — I10 Essential (primary) hypertension: Secondary | ICD-10-CM | POA: Diagnosis not present

## 2018-10-10 DIAGNOSIS — Z299 Encounter for prophylactic measures, unspecified: Secondary | ICD-10-CM | POA: Diagnosis not present

## 2018-10-10 DIAGNOSIS — F321 Major depressive disorder, single episode, moderate: Secondary | ICD-10-CM | POA: Diagnosis not present

## 2018-11-14 DIAGNOSIS — F0390 Unspecified dementia without behavioral disturbance: Secondary | ICD-10-CM | POA: Diagnosis not present

## 2018-11-14 DIAGNOSIS — I10 Essential (primary) hypertension: Secondary | ICD-10-CM | POA: Diagnosis not present

## 2018-11-14 DIAGNOSIS — E7800 Pure hypercholesterolemia, unspecified: Secondary | ICD-10-CM | POA: Diagnosis not present

## 2018-12-12 DIAGNOSIS — I10 Essential (primary) hypertension: Secondary | ICD-10-CM | POA: Diagnosis not present

## 2018-12-12 DIAGNOSIS — E7800 Pure hypercholesterolemia, unspecified: Secondary | ICD-10-CM | POA: Diagnosis not present

## 2018-12-12 DIAGNOSIS — F0390 Unspecified dementia without behavioral disturbance: Secondary | ICD-10-CM | POA: Diagnosis not present

## 2018-12-26 DIAGNOSIS — Z23 Encounter for immunization: Secondary | ICD-10-CM | POA: Diagnosis not present

## 2019-01-09 DIAGNOSIS — R5383 Other fatigue: Secondary | ICD-10-CM | POA: Diagnosis not present

## 2019-01-09 DIAGNOSIS — E559 Vitamin D deficiency, unspecified: Secondary | ICD-10-CM | POA: Diagnosis not present

## 2019-01-09 DIAGNOSIS — Z6821 Body mass index (BMI) 21.0-21.9, adult: Secondary | ICD-10-CM | POA: Diagnosis not present

## 2019-01-09 DIAGNOSIS — D582 Other hemoglobinopathies: Secondary | ICD-10-CM | POA: Diagnosis not present

## 2019-01-09 DIAGNOSIS — F0390 Unspecified dementia without behavioral disturbance: Secondary | ICD-10-CM | POA: Diagnosis not present

## 2019-01-09 DIAGNOSIS — E538 Deficiency of other specified B group vitamins: Secondary | ICD-10-CM | POA: Diagnosis not present

## 2019-01-09 DIAGNOSIS — Z299 Encounter for prophylactic measures, unspecified: Secondary | ICD-10-CM | POA: Diagnosis not present

## 2019-01-09 DIAGNOSIS — I10 Essential (primary) hypertension: Secondary | ICD-10-CM | POA: Diagnosis not present

## 2019-01-13 DIAGNOSIS — E7800 Pure hypercholesterolemia, unspecified: Secondary | ICD-10-CM | POA: Diagnosis not present

## 2019-01-13 DIAGNOSIS — F0390 Unspecified dementia without behavioral disturbance: Secondary | ICD-10-CM | POA: Diagnosis not present

## 2019-01-13 DIAGNOSIS — I10 Essential (primary) hypertension: Secondary | ICD-10-CM | POA: Diagnosis not present

## 2019-02-06 DIAGNOSIS — Z1339 Encounter for screening examination for other mental health and behavioral disorders: Secondary | ICD-10-CM | POA: Diagnosis not present

## 2019-02-06 DIAGNOSIS — Z79899 Other long term (current) drug therapy: Secondary | ICD-10-CM | POA: Diagnosis not present

## 2019-02-06 DIAGNOSIS — Z6820 Body mass index (BMI) 20.0-20.9, adult: Secondary | ICD-10-CM | POA: Diagnosis not present

## 2019-02-06 DIAGNOSIS — Z0000 Encounter for general adult medical examination without abnormal findings: Secondary | ICD-10-CM | POA: Diagnosis not present

## 2019-02-06 DIAGNOSIS — I10 Essential (primary) hypertension: Secondary | ICD-10-CM | POA: Diagnosis not present

## 2019-02-06 DIAGNOSIS — F0390 Unspecified dementia without behavioral disturbance: Secondary | ICD-10-CM | POA: Diagnosis not present

## 2019-02-06 DIAGNOSIS — Z7189 Other specified counseling: Secondary | ICD-10-CM | POA: Diagnosis not present

## 2019-02-06 DIAGNOSIS — Z299 Encounter for prophylactic measures, unspecified: Secondary | ICD-10-CM | POA: Diagnosis not present

## 2019-02-06 DIAGNOSIS — E7800 Pure hypercholesterolemia, unspecified: Secondary | ICD-10-CM | POA: Diagnosis not present

## 2019-02-06 DIAGNOSIS — Z1331 Encounter for screening for depression: Secondary | ICD-10-CM | POA: Diagnosis not present

## 2019-02-06 DIAGNOSIS — Z1211 Encounter for screening for malignant neoplasm of colon: Secondary | ICD-10-CM | POA: Diagnosis not present

## 2019-02-06 DIAGNOSIS — R5383 Other fatigue: Secondary | ICD-10-CM | POA: Diagnosis not present

## 2019-02-23 IMAGING — CT CT HEAD W/O CM
3 series · 15 of 47 positions shown, 18 images · non-contrast
Comparison: CT of the head performed 06/19/2010, and MRI of the
brain performed 10/12/2014

CLINICAL DATA: Acute onset of fever, chills and dysuria. Altered
mental status.

EXAM:
CT HEAD WITHOUT CONTRAST
TECHNIQUE: Contiguous axial images were obtained from the base of the skull
through the vertex without intravenous contrast.

[Series 2: head wo · axial · 0.41mm/px · z∈[+15,+140]mm · 9 of 31 slices shown, 12 images]
[im 3/31  brain]
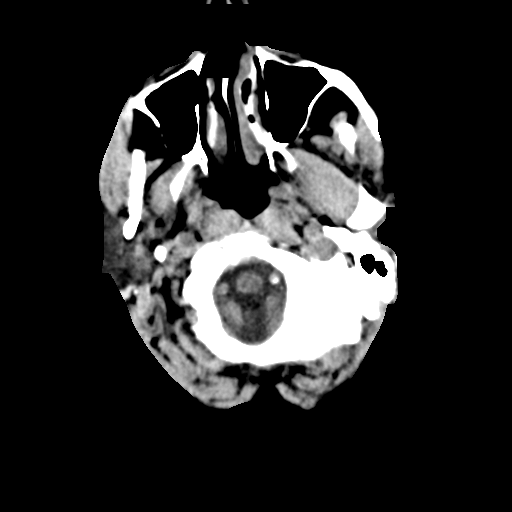
[im 3/31  bone]
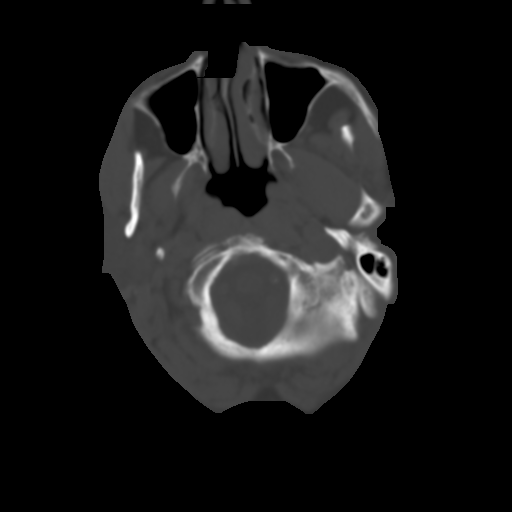
[im 6/31  brain]
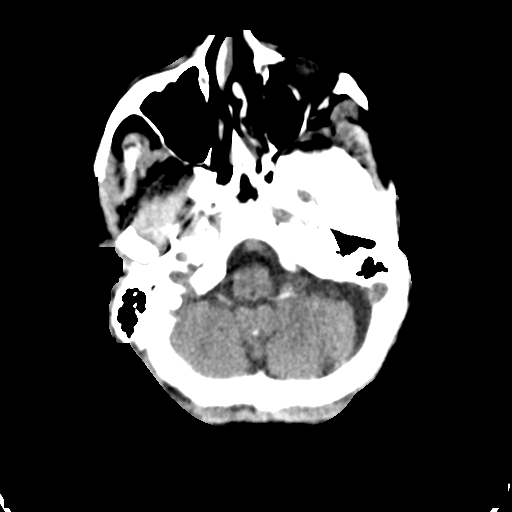
[im 9/31  brain]
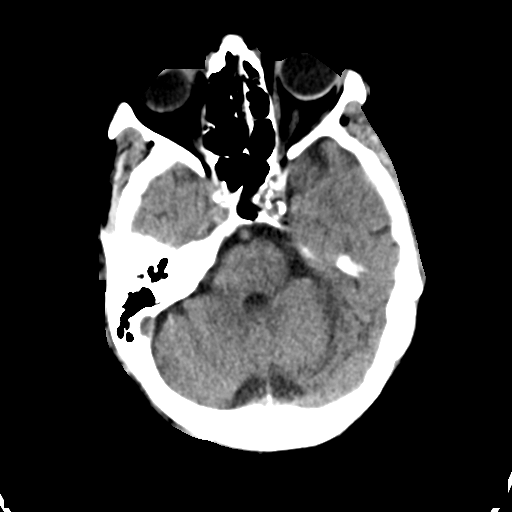
[im 12/31  brain]
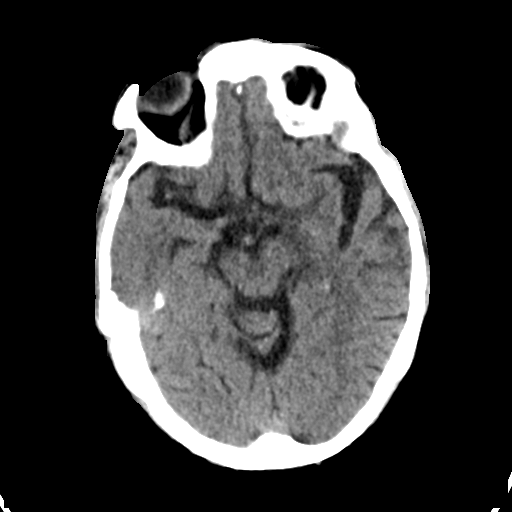
[im 16/31  brain]
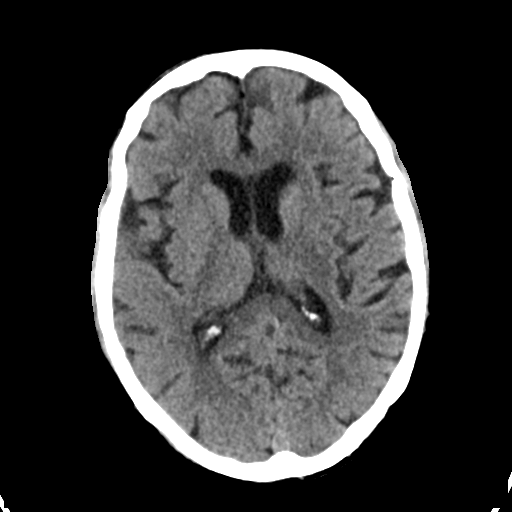
[im 16/31  bone]
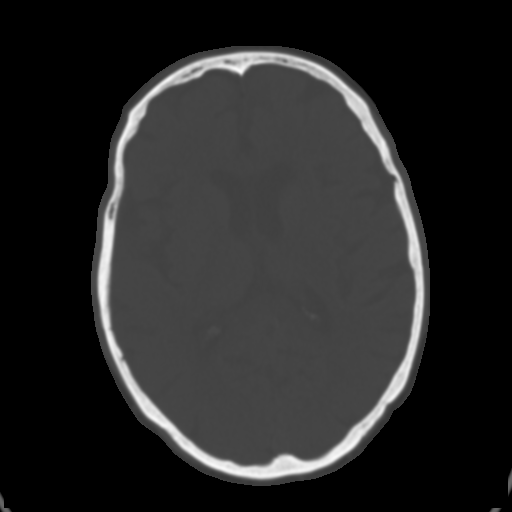
[im 19/31  brain]
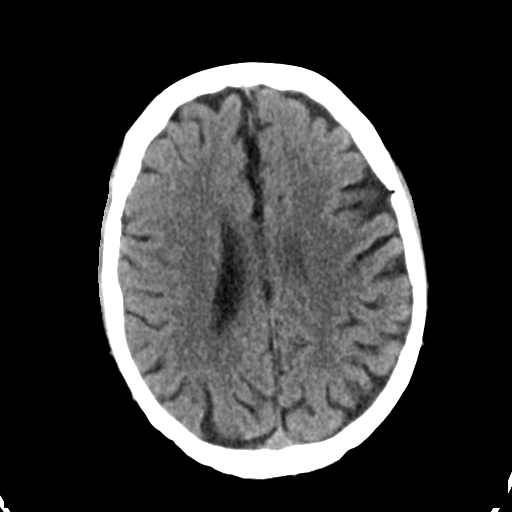
[im 22/31  brain]
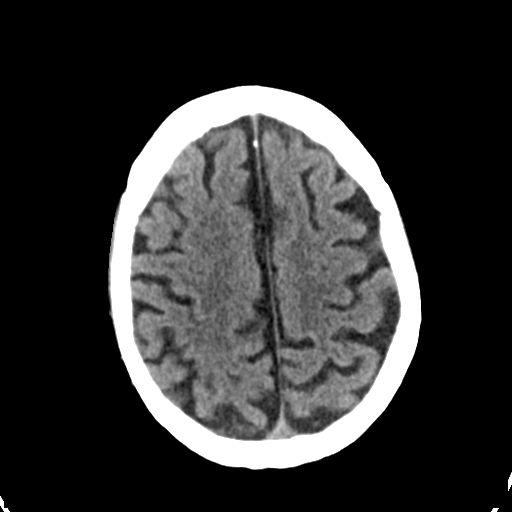
[im 25/31  brain]
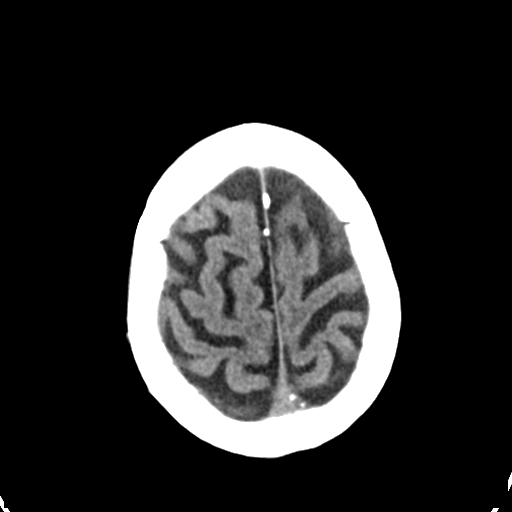
[im 28/31  brain]
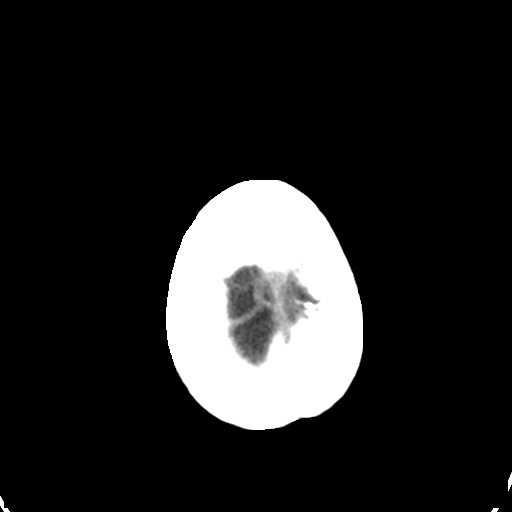
[im 28/31  bone]
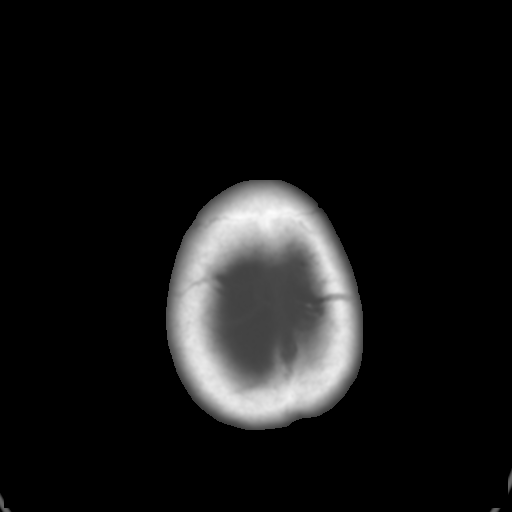

[Series 4: coronal soft tissue · coronal · 0.33mm/px · 3 of 71 slices shown]
[im 24/71  brain]
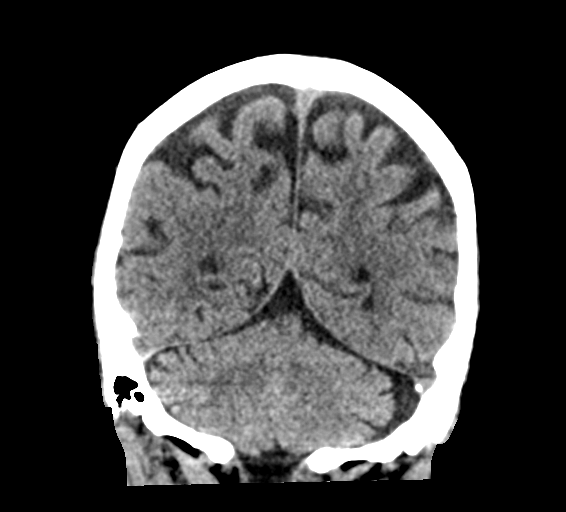
[im 32/71  brain]
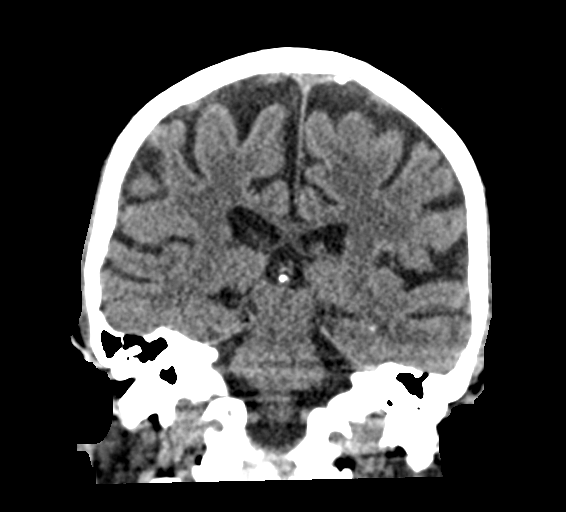
[im 39/71  brain]
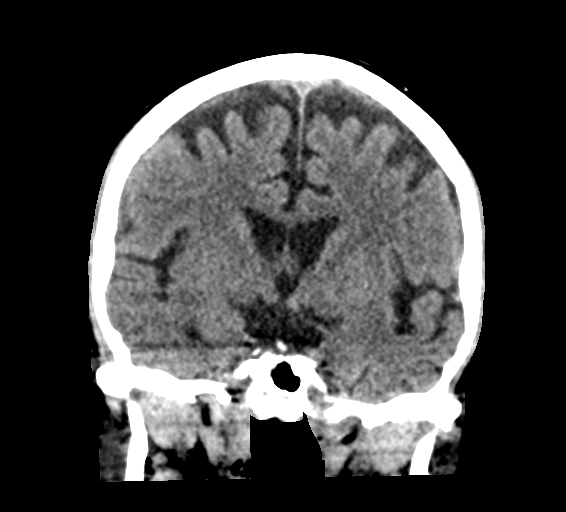

[Series 5: sagittal soft tissue · sagittal · 0.35mm/px · 3 of 59 slices shown]
[im 20/59  brain]
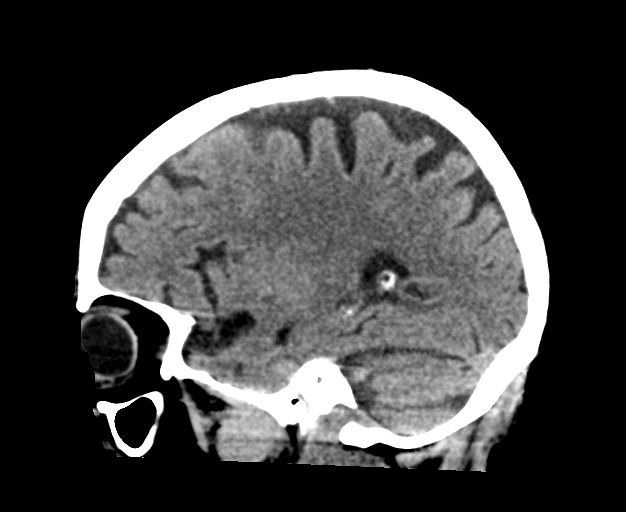
[im 30/59  brain]
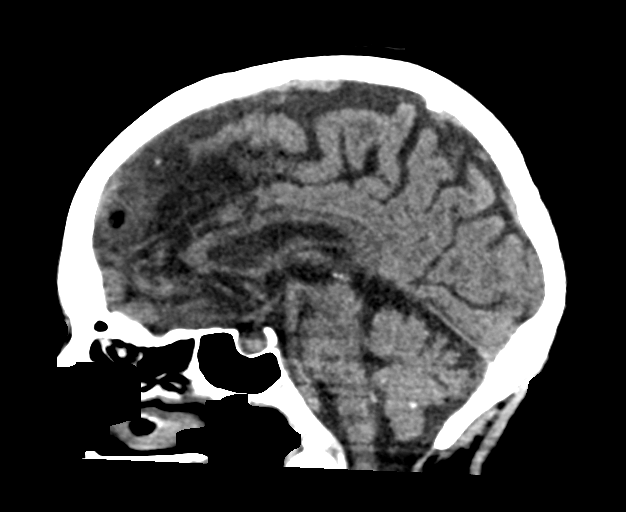
[im 39/59  brain]
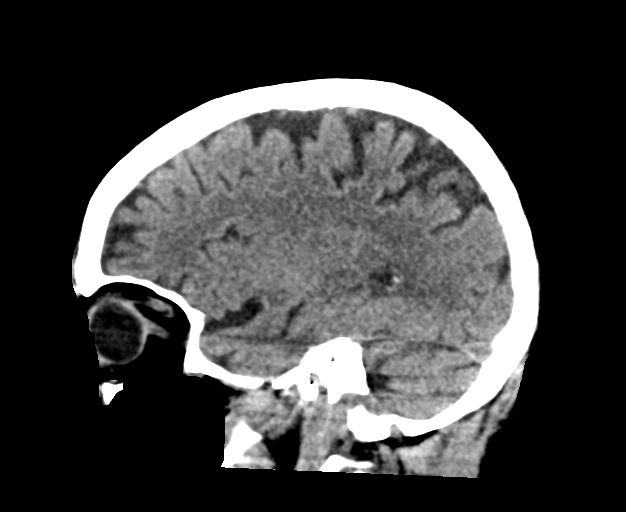

[15 of 47 positions shown; findings below may reference images not displayed]

FINDINGS: Brain: No evidence of acute infarction, hemorrhage, hydrocephalus,
extra-axial collection or mass lesion / mass effect.

Prominence of the ventricles and sulci reflects mild to moderate
cortical volume loss. Mild cerebellar atrophy is noted.

The brainstem and fourth ventricle are within normal limits. The
basal ganglia are unremarkable in appearance. The cerebral
hemispheres demonstrate grossly normal gray-white differentiation.
No mass effect or midline shift is seen.

Vascular: No hyperdense vessel or unexpected calcification.

Skull: There is no evidence of fracture; visualized osseous
structures are unremarkable in appearance.

Sinuses/Orbits: The orbits are within normal limits. The paranasal
sinuses and mastoid air cells are well-aerated.

Other: No significant soft tissue abnormalities are seen.
IMPRESSION: 1. No acute intracranial pathology seen on CT.
2. Mild to moderate cortical volume loss noted.

## 2019-03-22 DIAGNOSIS — F0390 Unspecified dementia without behavioral disturbance: Secondary | ICD-10-CM | POA: Diagnosis not present

## 2019-03-22 DIAGNOSIS — I10 Essential (primary) hypertension: Secondary | ICD-10-CM | POA: Diagnosis not present

## 2019-03-22 DIAGNOSIS — E7800 Pure hypercholesterolemia, unspecified: Secondary | ICD-10-CM | POA: Diagnosis not present

## 2019-04-07 DIAGNOSIS — Z23 Encounter for immunization: Secondary | ICD-10-CM | POA: Diagnosis not present

## 2019-05-01 DIAGNOSIS — F0390 Unspecified dementia without behavioral disturbance: Secondary | ICD-10-CM | POA: Diagnosis not present

## 2019-05-01 DIAGNOSIS — E7800 Pure hypercholesterolemia, unspecified: Secondary | ICD-10-CM | POA: Diagnosis not present

## 2019-05-01 DIAGNOSIS — I10 Essential (primary) hypertension: Secondary | ICD-10-CM | POA: Diagnosis not present

## 2019-05-05 DIAGNOSIS — Z23 Encounter for immunization: Secondary | ICD-10-CM | POA: Diagnosis not present

## 2019-05-29 DIAGNOSIS — F0390 Unspecified dementia without behavioral disturbance: Secondary | ICD-10-CM | POA: Diagnosis not present

## 2019-05-29 DIAGNOSIS — I10 Essential (primary) hypertension: Secondary | ICD-10-CM | POA: Diagnosis not present

## 2019-05-29 DIAGNOSIS — E7800 Pure hypercholesterolemia, unspecified: Secondary | ICD-10-CM | POA: Diagnosis not present

## 2019-07-20 DIAGNOSIS — I10 Essential (primary) hypertension: Secondary | ICD-10-CM | POA: Diagnosis not present

## 2019-07-20 DIAGNOSIS — E7800 Pure hypercholesterolemia, unspecified: Secondary | ICD-10-CM | POA: Diagnosis not present

## 2019-07-20 DIAGNOSIS — F0390 Unspecified dementia without behavioral disturbance: Secondary | ICD-10-CM | POA: Diagnosis not present

## 2019-09-20 DIAGNOSIS — F0390 Unspecified dementia without behavioral disturbance: Secondary | ICD-10-CM | POA: Diagnosis not present

## 2019-09-20 DIAGNOSIS — I10 Essential (primary) hypertension: Secondary | ICD-10-CM | POA: Diagnosis not present

## 2019-09-20 DIAGNOSIS — E7800 Pure hypercholesterolemia, unspecified: Secondary | ICD-10-CM | POA: Diagnosis not present

## 2019-10-20 DIAGNOSIS — I10 Essential (primary) hypertension: Secondary | ICD-10-CM | POA: Diagnosis not present

## 2019-10-20 DIAGNOSIS — F0390 Unspecified dementia without behavioral disturbance: Secondary | ICD-10-CM | POA: Diagnosis not present

## 2019-10-20 DIAGNOSIS — E7800 Pure hypercholesterolemia, unspecified: Secondary | ICD-10-CM | POA: Diagnosis not present

## 2019-11-02 DIAGNOSIS — N3021 Other chronic cystitis with hematuria: Secondary | ICD-10-CM | POA: Diagnosis not present

## 2019-11-02 DIAGNOSIS — R350 Frequency of micturition: Secondary | ICD-10-CM | POA: Diagnosis not present

## 2019-11-20 DIAGNOSIS — F321 Major depressive disorder, single episode, moderate: Secondary | ICD-10-CM | POA: Diagnosis not present

## 2019-11-20 DIAGNOSIS — F0390 Unspecified dementia without behavioral disturbance: Secondary | ICD-10-CM | POA: Diagnosis not present

## 2019-11-20 DIAGNOSIS — F0391 Unspecified dementia with behavioral disturbance: Secondary | ICD-10-CM | POA: Diagnosis not present

## 2019-12-21 DIAGNOSIS — I10 Essential (primary) hypertension: Secondary | ICD-10-CM | POA: Diagnosis not present

## 2019-12-21 DIAGNOSIS — F0390 Unspecified dementia without behavioral disturbance: Secondary | ICD-10-CM | POA: Diagnosis not present

## 2019-12-21 DIAGNOSIS — E7800 Pure hypercholesterolemia, unspecified: Secondary | ICD-10-CM | POA: Diagnosis not present

## 2019-12-30 DIAGNOSIS — Z23 Encounter for immunization: Secondary | ICD-10-CM | POA: Diagnosis not present

## 2020-01-19 DIAGNOSIS — F0390 Unspecified dementia without behavioral disturbance: Secondary | ICD-10-CM | POA: Diagnosis not present

## 2020-01-19 DIAGNOSIS — I10 Essential (primary) hypertension: Secondary | ICD-10-CM | POA: Diagnosis not present

## 2020-01-19 DIAGNOSIS — E7800 Pure hypercholesterolemia, unspecified: Secondary | ICD-10-CM | POA: Diagnosis not present

## 2020-01-25 DIAGNOSIS — Z23 Encounter for immunization: Secondary | ICD-10-CM | POA: Diagnosis not present

## 2020-02-19 DIAGNOSIS — Z299 Encounter for prophylactic measures, unspecified: Secondary | ICD-10-CM | POA: Diagnosis not present

## 2020-02-19 DIAGNOSIS — Z79899 Other long term (current) drug therapy: Secondary | ICD-10-CM | POA: Diagnosis not present

## 2020-02-19 DIAGNOSIS — I10 Essential (primary) hypertension: Secondary | ICD-10-CM | POA: Diagnosis not present

## 2020-02-19 DIAGNOSIS — Z7189 Other specified counseling: Secondary | ICD-10-CM | POA: Diagnosis not present

## 2020-02-19 DIAGNOSIS — R5383 Other fatigue: Secondary | ICD-10-CM | POA: Diagnosis not present

## 2020-02-19 DIAGNOSIS — Z789 Other specified health status: Secondary | ICD-10-CM | POA: Diagnosis not present

## 2020-02-19 DIAGNOSIS — Z1339 Encounter for screening examination for other mental health and behavioral disorders: Secondary | ICD-10-CM | POA: Diagnosis not present

## 2020-02-19 DIAGNOSIS — Z1331 Encounter for screening for depression: Secondary | ICD-10-CM | POA: Diagnosis not present

## 2020-02-19 DIAGNOSIS — E7800 Pure hypercholesterolemia, unspecified: Secondary | ICD-10-CM | POA: Diagnosis not present

## 2020-02-19 DIAGNOSIS — Z6820 Body mass index (BMI) 20.0-20.9, adult: Secondary | ICD-10-CM | POA: Diagnosis not present

## 2020-02-19 DIAGNOSIS — Z0000 Encounter for general adult medical examination without abnormal findings: Secondary | ICD-10-CM | POA: Diagnosis not present

## 2020-02-20 DIAGNOSIS — E7800 Pure hypercholesterolemia, unspecified: Secondary | ICD-10-CM | POA: Diagnosis not present

## 2020-02-20 DIAGNOSIS — I10 Essential (primary) hypertension: Secondary | ICD-10-CM | POA: Diagnosis not present

## 2020-02-20 DIAGNOSIS — F0390 Unspecified dementia without behavioral disturbance: Secondary | ICD-10-CM | POA: Diagnosis not present

## 2020-03-21 DIAGNOSIS — E7800 Pure hypercholesterolemia, unspecified: Secondary | ICD-10-CM | POA: Diagnosis not present

## 2020-03-21 DIAGNOSIS — I10 Essential (primary) hypertension: Secondary | ICD-10-CM | POA: Diagnosis not present

## 2020-03-21 DIAGNOSIS — F0390 Unspecified dementia without behavioral disturbance: Secondary | ICD-10-CM | POA: Diagnosis not present

## 2020-04-22 DIAGNOSIS — F329 Major depressive disorder, single episode, unspecified: Secondary | ICD-10-CM | POA: Diagnosis not present

## 2020-04-22 DIAGNOSIS — I10 Essential (primary) hypertension: Secondary | ICD-10-CM | POA: Diagnosis not present

## 2020-04-22 DIAGNOSIS — E7800 Pure hypercholesterolemia, unspecified: Secondary | ICD-10-CM | POA: Diagnosis not present

## 2020-04-22 DIAGNOSIS — K219 Gastro-esophageal reflux disease without esophagitis: Secondary | ICD-10-CM | POA: Diagnosis not present

## 2020-05-12 ENCOUNTER — Emergency Department (HOSPITAL_COMMUNITY): Payer: Medicare Other

## 2020-05-12 ENCOUNTER — Inpatient Hospital Stay (HOSPITAL_COMMUNITY)
Admission: EM | Admit: 2020-05-12 | Discharge: 2020-05-16 | DRG: 280 | Disposition: A | Discharge status: Home Health | Payer: Medicare Other | Attending: Internal Medicine | Admitting: Internal Medicine

## 2020-05-12 ENCOUNTER — Encounter (HOSPITAL_COMMUNITY): Payer: Self-pay | Admitting: Emergency Medicine

## 2020-05-12 DIAGNOSIS — Z66 Do not resuscitate: Secondary | ICD-10-CM | POA: Diagnosis present

## 2020-05-12 DIAGNOSIS — R739 Hyperglycemia, unspecified: Secondary | ICD-10-CM | POA: Diagnosis present

## 2020-05-12 DIAGNOSIS — Z885 Allergy status to narcotic agent status: Secondary | ICD-10-CM

## 2020-05-12 DIAGNOSIS — D649 Anemia, unspecified: Secondary | ICD-10-CM | POA: Diagnosis present

## 2020-05-12 DIAGNOSIS — Z681 Body mass index (BMI) 19 or less, adult: Secondary | ICD-10-CM

## 2020-05-12 DIAGNOSIS — R0689 Other abnormalities of breathing: Secondary | ICD-10-CM | POA: Diagnosis not present

## 2020-05-12 DIAGNOSIS — Z886 Allergy status to analgesic agent status: Secondary | ICD-10-CM

## 2020-05-12 DIAGNOSIS — R52 Pain, unspecified: Secondary | ICD-10-CM | POA: Diagnosis not present

## 2020-05-12 DIAGNOSIS — I10 Essential (primary) hypertension: Secondary | ICD-10-CM | POA: Diagnosis not present

## 2020-05-12 DIAGNOSIS — E43 Unspecified severe protein-calorie malnutrition: Secondary | ICD-10-CM | POA: Diagnosis present

## 2020-05-12 DIAGNOSIS — E876 Hypokalemia: Secondary | ICD-10-CM | POA: Diagnosis present

## 2020-05-12 DIAGNOSIS — I129 Hypertensive chronic kidney disease with stage 1 through stage 4 chronic kidney disease, or unspecified chronic kidney disease: Secondary | ICD-10-CM | POA: Diagnosis present

## 2020-05-12 DIAGNOSIS — K449 Diaphragmatic hernia without obstruction or gangrene: Secondary | ICD-10-CM | POA: Diagnosis present

## 2020-05-12 DIAGNOSIS — F0390 Unspecified dementia without behavioral disturbance: Secondary | ICD-10-CM | POA: Diagnosis present

## 2020-05-12 DIAGNOSIS — Z20822 Contact with and (suspected) exposure to covid-19: Secondary | ICD-10-CM | POA: Diagnosis not present

## 2020-05-12 DIAGNOSIS — R112 Nausea with vomiting, unspecified: Secondary | ICD-10-CM | POA: Diagnosis not present

## 2020-05-12 DIAGNOSIS — I214 Non-ST elevation (NSTEMI) myocardial infarction: Secondary | ICD-10-CM | POA: Diagnosis not present

## 2020-05-12 DIAGNOSIS — F419 Anxiety disorder, unspecified: Secondary | ICD-10-CM | POA: Diagnosis present

## 2020-05-12 DIAGNOSIS — N1831 Chronic kidney disease, stage 3a: Secondary | ICD-10-CM | POA: Diagnosis present

## 2020-05-12 DIAGNOSIS — Z951 Presence of aortocoronary bypass graft: Secondary | ICD-10-CM

## 2020-05-12 DIAGNOSIS — R110 Nausea: Secondary | ICD-10-CM | POA: Diagnosis not present

## 2020-05-12 DIAGNOSIS — R0789 Other chest pain: Secondary | ICD-10-CM | POA: Diagnosis not present

## 2020-05-12 DIAGNOSIS — R079 Chest pain, unspecified: Secondary | ICD-10-CM | POA: Diagnosis not present

## 2020-05-12 DIAGNOSIS — E785 Hyperlipidemia, unspecified: Secondary | ICD-10-CM | POA: Diagnosis present

## 2020-05-12 DIAGNOSIS — Z833 Family history of diabetes mellitus: Secondary | ICD-10-CM

## 2020-05-12 DIAGNOSIS — I2510 Atherosclerotic heart disease of native coronary artery without angina pectoris: Secondary | ICD-10-CM | POA: Diagnosis present

## 2020-05-12 DIAGNOSIS — R7989 Other specified abnormal findings of blood chemistry: Secondary | ICD-10-CM | POA: Diagnosis not present

## 2020-05-12 DIAGNOSIS — Z79899 Other long term (current) drug therapy: Secondary | ICD-10-CM

## 2020-05-12 DIAGNOSIS — Z515 Encounter for palliative care: Secondary | ICD-10-CM

## 2020-05-12 DIAGNOSIS — R000 Tachycardia, unspecified: Secondary | ICD-10-CM | POA: Diagnosis not present

## 2020-05-12 DIAGNOSIS — K219 Gastro-esophageal reflux disease without esophagitis: Secondary | ICD-10-CM | POA: Diagnosis present

## 2020-05-12 DIAGNOSIS — E872 Acidosis: Secondary | ICD-10-CM | POA: Diagnosis present

## 2020-05-12 DIAGNOSIS — F32A Depression, unspecified: Secondary | ICD-10-CM | POA: Diagnosis present

## 2020-05-12 DIAGNOSIS — Z87440 Personal history of urinary (tract) infections: Secondary | ICD-10-CM

## 2020-05-12 DIAGNOSIS — Z7982 Long term (current) use of aspirin: Secondary | ICD-10-CM

## 2020-05-12 LAB — CBC
HCT: 33.3 % — ABNORMAL LOW (ref 36.0–46.0)
Hemoglobin: 10.8 g/dL — ABNORMAL LOW (ref 12.0–15.0)
MCH: 30.9 pg (ref 26.0–34.0)
MCHC: 32.4 g/dL (ref 30.0–36.0)
MCV: 95.1 fL (ref 80.0–100.0)
Platelets: 239 10*3/uL (ref 150–400)
RBC: 3.5 MIL/uL — ABNORMAL LOW (ref 3.87–5.11)
RDW: 13.2 % (ref 11.5–15.5)
WBC: 10.5 10*3/uL (ref 4.0–10.5)
nRBC: 0 % (ref 0.0–0.2)

## 2020-05-12 LAB — RESP PANEL BY RT-PCR (FLU A&B, COVID) ARPGX2
Influenza A by PCR: NEGATIVE
Influenza B by PCR: NEGATIVE
SARS Coronavirus 2 by RT PCR: NEGATIVE

## 2020-05-12 LAB — HEPATIC FUNCTION PANEL
ALT: 15 U/L (ref 0–44)
AST: 36 U/L (ref 15–41)
Albumin: 3.8 g/dL (ref 3.5–5.0)
Alkaline Phosphatase: 56 U/L (ref 38–126)
Bilirubin, Direct: 0.5 mg/dL — ABNORMAL HIGH (ref 0.0–0.2)
Indirect Bilirubin: 0.4 mg/dL (ref 0.3–0.9)
Total Bilirubin: 0.9 mg/dL (ref 0.3–1.2)
Total Protein: 6.4 g/dL — ABNORMAL LOW (ref 6.5–8.1)

## 2020-05-12 LAB — BASIC METABOLIC PANEL
Anion gap: 16 — ABNORMAL HIGH (ref 5–15)
BUN: 15 mg/dL (ref 8–23)
CO2: 19 mmol/L — ABNORMAL LOW (ref 22–32)
Calcium: 8.8 mg/dL — ABNORMAL LOW (ref 8.9–10.3)
Chloride: 105 mmol/L (ref 98–111)
Creatinine, Ser: 1.1 mg/dL — ABNORMAL HIGH (ref 0.44–1.00)
GFR, Estimated: 49 mL/min — ABNORMAL LOW (ref >60)
Glucose, Bld: 227 mg/dL — ABNORMAL HIGH (ref 70–99)
Potassium: 2.9 mmol/L — ABNORMAL LOW (ref 3.5–5.1)
Sodium: 140 mmol/L (ref 135–145)

## 2020-05-12 LAB — TROPONIN I (HIGH SENSITIVITY): Troponin I (High Sensitivity): 31 ng/L — ABNORMAL HIGH (ref <18)

## 2020-05-12 LAB — LIPASE, BLOOD: Lipase: 25 U/L (ref 11–51)

## 2020-05-12 MED ORDER — ACETAMINOPHEN 500 MG PO TABS
1000.0000 mg | ORAL_TABLET | Freq: Once | ORAL | Status: AC
  Administered 2020-05-12: 1000 mg via ORAL
  Filled 2020-05-12: qty 2

## 2020-05-12 MED ORDER — POTASSIUM CHLORIDE 10 MEQ/100ML IV SOLN
10.0000 meq | INTRAVENOUS | Status: AC
  Administered 2020-05-12 – 2020-05-13 (×2): 10 meq via INTRAVENOUS
  Filled 2020-05-12 (×2): qty 100

## 2020-05-12 MED ORDER — ONDANSETRON HCL 4 MG/2ML IJ SOLN
4.0000 mg | Freq: Once | INTRAMUSCULAR | Status: AC
  Administered 2020-05-12: 4 mg via INTRAVENOUS
  Filled 2020-05-12: qty 2

## 2020-05-12 MED ORDER — ASPIRIN 81 MG PO CHEW
324.0000 mg | CHEWABLE_TABLET | Freq: Once | ORAL | Status: AC
  Administered 2020-05-12: 324 mg via ORAL
  Filled 2020-05-12: qty 4

## 2020-05-12 NOTE — ED Notes (Signed)
Lab to add on magnesium  

## 2020-05-12 NOTE — ED Provider Notes (Signed)
[facility] EMERGENCY DEPARTMENT Provider Note   CSN: [id number] Arrival date & time: 05/12/20  2108     History Chief Complaint  Patient presents with  . Chest Pain    Gloria Rowland is a 87 y.o. female with past medical history of CAD s/p CABG, HTN, HLD, cholecystectomy, hiatal hernia, dementia, recurrent UTIs presents to ER by EMS from home for evaluation of chest pain. Initial history obtained from patient who is alone in room. Patient is very HOH. I also called patient's POA daughter [name] and daughter in law [place].  Patient reports earlier today she started "shaking uncontrollably" while she was sitting in her chair. She felt like she was going to die. Had nausea and vomiting 3-4 times. Then develop left sided chest pain. Doesn't know how to describe it or if it radiated anywhere. She reports upper back pain as well.  Her upper abdomen is "sore" but always stays sore. Had some shortness of breath like she was gasping for air but this has gone away now. Has persistent nausea and upper abdominal soreness now. No more chest pain. Also has a dry cough. Some burning with urination but not that bad. Tells me she is vaccinated for the virus, three times. Denies fever. Level 5 caveat due to dementia. EMS attempted to give 324 mg but patient threw it up. EMS gave 4 mg IV ativan and nitroglycerin twice.   Daughter/POA Graciella Belton and daughter in law [place] report patient and her husband went to eat a "biscuit" and drove to [place]. When they got back home patient reported nausea and started vomiting several times. She was shaking and said she felt like she was going to die. Family report patient has "moderate to advanced dementia" and frequently expresses she feels like she is going to die.  Patient then reported chest and back pain. No fever. They report history of constipation. She had open heart surgery 20 years ago by Dr Donnie Aho who has since retired. Patient did not want to follow up  with cardiology after Dr Donnie Aho retired. Recent upper EGD. Confirm patient is vaccinated for COVID with a booster. Lives at home with husband. On nitrofurantoin for chronic UTIs.  HPI     Past Medical History:  Diagnosis Date  . Baker's cyst, ruptured   . CAD (coronary artery disease)   . Dementia (HCC)   . History of hepatitis   . Hyperlipidemia   . Hypertension   . Lumbar degenerative disc disease   . Nephrolithiasis   . Spasmodic torticollis     Patient Active Problem List   Diagnosis Date Noted  . Intractable nausea and vomiting 04/10/2018  . Hydronephrosis of left kidney 04/10/2018  . Dementia without behavioral disturbance (HCC) 04/10/2018  . Pain of upper abdomen   . Abdominal pain, epigastric 12/24/2016  . Hiatal hernia 05/27/2011  . CAD (coronary artery disease)   . S/P CABG (coronary artery bypass graft)   . Hyperlipidemia   . Hypertension   . Nephrolithiasis   . Lumbar degenerative disc disease   . Dementia [facility])     Past Surgical History:  Procedure Laterality Date  . ABDOMINAL HYSTERECTOMY    . CARPAL TUNNEL RELEASE    . CHOLECYSTECTOMY    . CORONARY ARTERY BYPASS GRAFT  1998   LIMA to LAD-Dx  . ESOPHAGOGASTRODUODENOSCOPY N/A 01/15/2017   Procedure: ESOPHAGOGASTRODUODENOSCOPY (EGD);  Surgeon: Malissa Hippo, MD;  Location: [facility] ENDO SUITE;  Service: Endoscopy;  Laterality: N/A;  1:00  .  KIDNEY STONE SURGERY    . LEFT HEART CATHETERIZATION WITH CORONARY/GRAFT ANGIOGRAM N/A 05/26/2011   Procedure: LEFT HEART CATHETERIZATION WITH Isabel CapriceORONARY/GRAFT ANGIOGRAM;  Surgeon: Othella BoyerWilliam S Tilley, MD;  [facility];  Service: Cardiovascular;  Laterality: N/A;  . LUMBAR LAMINECTOMY       OB History   No obstetric history on file.     Family History  Problem Relation Age of Onset  . Diabetes Sister   . Diabetes Sister     Social History   Tobacco Use  . Smoking status: Never Smoker  . Smokeless tobacco: Never Used  Vaping Use  . Vaping Use: Never  used  Substance Use Topics  . Alcohol use: No  . Drug use: No    Home Medications Prior to Admission medications   Medication Sig Start Date End Date Taking? Authorizing Provider  acetaminophen (TYLENOL) 650 MG CR tablet Take 650 mg by mouth every 6 (six) hours as needed for pain.    [provider]  amLODipine (NORVASC) 5 MG tablet Take 5 mg by mouth daily.    [provider]  aspirin EC 81 MG tablet Take 81 mg by mouth daily.    [provider]  citalopram (CELEXA) 10 MG tablet Take 10 mg by mouth daily.    [provider]  Dextromethorphan-Guaifenesin (ROBITUSSIN COUGH+CHEST CONG DM) 5-100 MG/5ML LIQD Give 10 ml as needed for cough every 4 hours prn    [provider]  GEMFIBROZIL PO Take 300 mg by mouth 2 (two) times daily.    [provider]  HYDROcodone-acetaminophen (NORCO/VICODIN) 5-325 MG tablet Take 1 tablet by mouth every 6 (six) hours as needed. 08/20/17   Horton, Mayer Maskerourtney F, MD  lisinopril (PRINIVIL,ZESTRIL) 20 MG tablet Take 20 mg by mouth 2 (two) times daily.     [provider]  meclizine (ANTIVERT) 12.5 MG tablet Take 12.5 mg by mouth as needed for dizziness.    [provider]  memantine (NAMENDA) 10 MG tablet Take 10 mg by mouth 2 (two) times daily.    [provider]  ondansetron (ZOFRAN ODT) 4 MG disintegrating tablet Take 1 tablet (4 mg total) by mouth every 8 (eight) hours as needed for nausea or vomiting. 04/12/18   Tat, Onalee Huaavid, MD  pantoprazole (PROTONIX) 40 MG tablet Take 1 tablet (40 mg total) by mouth daily. 04/12/18   Catarina Hartshornat, David, MD  polyethylene glycol powder (GLYCOLAX/MIRALAX) powder Take 17 g by mouth daily as needed. For constipation. 12/04/16   [provider]  rosuvastatin (CRESTOR) 10 MG tablet Take 10 mg by mouth daily.    [provider]  trimethoprim (TRIMPEX) 100 MG tablet Take 100 mg by mouth daily.  04/26/17   [provider]  Vitamin D,  Ergocalciferol, (DRISDOL) 50000 units CAPS capsule Take 50,000 Units by mouth every 7 (seven) days.    [provider]    Allergies    Aspirin and Codeine  Review of Systems   Review of Systems  Constitutional:       Rigors  Respiratory: Positive for cough and shortness of breath.   Cardiovascular: Positive for chest pain.  Gastrointestinal: Positive for abdominal pain, nausea and vomiting.  Genitourinary: Positive for dysuria.  Musculoskeletal: Positive for back pain.  All other systems reviewed and are negative.   Physical Exam Updated Vital Signs BP (!) 162/76   Pulse 66   Temp 98.3 F (36.8 C) (Oral)   Resp 18   Ht 5\' 4"  (1.626  m)   Wt 51.7 kg   SpO2 99%   BMI 19.56 kg/m   Physical Exam Constitutional:      Appearance: She is well-developed.     Comments: NAD. Non toxic. Awake   HENT:     Head: Normocephalic and atraumatic.     Nose: Nose normal.  Eyes:     General: Lids are normal.     Conjunctiva/sclera: Conjunctivae normal.  Neck:     Trachea: Trachea normal.     Comments: Trachea midline.  Cardiovascular:     Rate and Rhythm: Normal rate and regular rhythm.     Pulses:          Radial pulses are 1+ on the right side and 1+ on the left side.       Dorsalis pedis pulses are 1+ on the right side and 1+ on the left side.     Heart sounds: Normal heart sounds, S1 normal and S2 normal.     Comments: No murmurs. No LE edema or calf tenderness.  Pulmonary:     Effort: Pulmonary effort is normal.     Breath sounds: Normal breath sounds.  Chest:     Comments: No reproducible chest wall tenderness Abdominal:     General: Bowel sounds are normal.     Palpations: Abdomen is soft.     Tenderness: There is abdominal tenderness (epigastric, mild). There is left CVA tenderness.     Comments: Flat, soft. ND.   Musculoskeletal:     Cervical back: Normal range of motion.  Skin:    General: Skin is warm and dry.     Capillary Refill: Capillary refill takes  less than 2 seconds.     Comments: No rash to chest wall. No contusions on chest  Neurological:     Mental Status: She is alert.     GCS: GCS eye subscore is 4. GCS verbal subscore is 5. GCS motor subscore is 6.     Comments: Sensation and strength intact in upper/lower extremities  Psychiatric:        Speech: Speech normal.        Behavior: Behavior normal.        Thought Content: Thought content normal.     ED Results / Procedures / Treatments   Labs (all labs ordered are listed, but only abnormal results are displayed) Labs Reviewed  BASIC METABOLIC PANEL - Abnormal; Notable for the following components:      Result Value   Potassium 2.9 (*)    CO2 19 (*)    Glucose, Bld 227 (*)    Creatinine, Ser 1.10 (*)    Calcium 8.8 (*)    GFR, Estimated 49 (*)    Anion gap 16 (*)    All other components within normal limits  CBC - Abnormal; Notable for the following components:   RBC 3.50 (*)    Hemoglobin 10.8 (*)    HCT 33.3 (*)    All other components within normal limits  HEPATIC FUNCTION PANEL - Abnormal; Notable for the following components:   Total Protein 6.4 (*)    Bilirubin, Direct 0.5 (*)    All other components within normal limits  TROPONIN I (HIGH SENSITIVITY) - Abnormal; Notable for the following components:   Troponin I (High Sensitivity) 31 (*)    All other components within normal limits  RESP PANEL BY RT-PCR (FLU A&B, COVID) ARPGX2  LIPASE, BLOOD  URINALYSIS, ROUTINE W REFLEX MICROSCOPIC  TROPONIN I (HIGH SENSITIVITY)  EKG EKG Interpretation  Date/Time:  Sunday May 12 2020 22:34:11 EST Ventricular Rate:  71 PR Interval:  334 QRS Duration: 89 QT Interval:  491 QTC Calculation: 534 R Axis:   -3 Text Interpretation: Sinus rhythm Atrial premature complexes Short PR interval Anteroseptal infarct, age indeterminate Prolonged QT interval Confirmed by Norman Clay ([id number]) on 05/12/2020 10:35:27 PM   Radiology DG Chest 2 View  Result Date:  05/12/2020 CLINICAL DATA:  Substernal chest pain. EXAM: CHEST - 2 VIEW COMPARISON:  May 01, 2017 FINDINGS: Multiple sternal wires are noted. The lungs are mildly hyperinflated. There is no evidence of acute infiltrate, pleural effusion or pneumothorax. The cardiac silhouette is mildly enlarged. There is moderate severity calcification of the thoracic aorta. Degenerative changes seen throughout the thoracic spine. IMPRESSION: Stable exam without active cardiopulmonary disease. Electronically Signed   By: Aram Candela M.D.   On: 05/12/2020 22:17    Procedures Procedures   Medications Ordered in ED Medications  aspirin chewable tablet 324 mg (has no administration in time range)  acetaminophen (TYLENOL) tablet 1,000 mg (has no administration in time range)  ondansetron (ZOFRAN) injection 4 mg (4 mg Intravenous Given 05/12/20 2228)    ED Course  I have reviewed the triage vital signs and the nursing notes.  Pertinent labs & imaging results that were available during my care of the patient were reviewed by me and considered in my medical decision making (see chart for details).  Clinical Course as of 05/12/20 2324  Wynelle Link May 12, 2020  2230 [facility] 2013 ANGIOGRAPHIC DATA:    CORONARY ARTERIES:   Arise and distribute normally.  Right dominant. Significant coronary calcification is noted.  Left main coronary artery: Mild calcification of the distal left main with mild 10- 20% narrowing..  Left anterior descending: Calcified and occluded proximally. Fills by collaterals from the sequential mammary graft.  Circumflex coronary artery: Calcified with moderate ectasia and tortuosity but no significant focal narrowing noted..  Right coronary artery: Calcified somewhat. There is a moderate 40% ostial stenosis. Moderate irregularities noted but no significant obstructive stenosis is noted.  Internal mammary graft to LAD/diagonal: Widely patent.  LEFT VENTRICULOGRAM:  Performed in the 30  RAO projection.  The aortic valve is normal. There is dense mitral annular calcification noted. The left ventricle is normal in size with normal wall motion. The estimated ejection fraction is 60%.   IMPRESSIONS: 1. Widely patent internal mammary graft done sequentially to the LAD and diagonal branch with occlusion of the LAD 2. Calcification and mild coronary disease involving the circumflex and right coronary artery 3. Normal left ventricle.Marland Kitchen  RECOMMENDATION:  Evaluation for other sources of pain. [CG]  2235 EKG 12-Lead Sinus tachycardia with 1st degree A-V block Septal infarct , age undetermined Marked ST abnormality, possible inferior subendocardial injury Abnormal ECG Confirmed by Norman Clay ([id number]) on 05/12/2020 10:22:46 PM [CG]  2236 EKG 12-Lead Sinus rhythm Atrial premature complexes Short PR interval Anteroseptal infarct, age indeterminate Prolonged QT interval Confirmed by Norman Clay ([id number]) on 05/12/2020 10:35:27 PM [CG]  2307 WBC: 10.5 [CG]  2307 Hemoglobin(!): 10.8 [CG]  2307 Potassium(!): 2.9 [CG]  2307 Glucose(!): 227 [CG]  2307 Creatinine(!): 1.10 [CG]  2307 GFR, Estimated(!): 49 [CG]  2307 BUN: 15 [CG]  2307 Anion gap(!): 16 [CG]  2308 Lipase: 25 [CG]  2308 AST: 36 [CG]  2308 ALT: 15 [CG]  2308 Alkaline Phosphatase: 56 [CG]  2308 Troponin I (High Sensitivity)(!): 31 [CG]  2308 EKG 12-Lead Sinus rhythm Atrial premature complexes  Short PR interval Anteroseptal infarct, age indeterminate Prolonged QT interval 534 Confirmed by Norman Clay ([id number]) on 05/12/2020 10:35:27 PM [CG]  2309 EGD 2018 - Normal esophagus. - Normal esophagus. Outpatient - Z-line irregular, 40 cm from the incisors. - 2 cm hiatal hernia. - Normal stomach. - Normal duodenal bulb and second portion of the duodenum. - No specimens collected. [CG]  2311 CTAP 2020 IMPRESSION: 1. No clear acute abdominopelvic findings. 2. Chronic dilatation of the extrahepatic bile ducts following  cholecystectomy. 3. Chronic LEFT UPJ obstruction. 4. Stable small cystic lesion towards the head of the pancreas. 5. No bowel obstruction. [CG]    Clinical Course User Index [CG] Jerrell Mylar   MDM Rules/Calculators/A&P                           87 y.o. yo with chief complaint of nausea, vomiting, epigastric abdominal pain, chest pain and back pain, rigors, cough.  Initial history obtained from patient.  Level 5 caveat to some degree given dementia.  Given nitroglycerin x2, Ativan on route by EMS.  No longer having chest pain but reporting epigastric "soreness" and nausea.  Previous medical records available, triage and nursing notes reviewed to obtain more history and assist with MDM  Additional information obtained from char review and patient's family.  I spoke to patient's daughter/POA Diane as well as patient's daughter-in-law Yehuda Mao.   Patient had a left heart cath in 2013-showing 10 to 40% stenosis.  Had an upper EGD in 2018 that showed hiatal hernia but normal esophagus and stomach.  Most recent CT A/P shows chronic dilation of the extrahepatic bile ducts following cholecystectomy and a chronic left UPJ obstruction with a small cystic lesion in the head of the pancreas.  Was hospitalized 2 years ago for epigastric abdominal pain and intractable nausea and vomiting.  Chief complain involves an extensive number of treatment options and is a complaint that carries with it a high risk of complications and morbidity and mortality.    Differential diagnosis: ACS like STEMI versus non-STEMI, unstable angina.  Given epigastric discomfort, nausea and vomiting also considering viral gastroenteritis, gastritis/PUD, pancreatitis.  Reportedly had some fast food prior to symptom onset.  She is hemodynamically stable with only mild epigastric tenderness, no rebound or peritonitis and perforation considered less likely.  S/p cholecystectomy.  Lower differential or dissection, AAA.  She does  have left CVA tenderness and some chronic dysuria, history of recurrent UTIs and on Macrobid.  GU etiology like pyelonephritis, ureteral stone considered as well.  She does report a cough, rigors but fully vaccinated for Covid with a booster. Differential is broad.  ER lab work and imaging ordered by triage RN and me, as above  I have personally visualized and interpreted ER diagnostic work up including labs and imaging.    Labs reveal -troponin 31, pending delta.  Hemoglobin 10.8, HCT 33.3.  K2.9.  Glucose 227.  Anion gap 16.  Creatinine 1.10 with GFR 49.  Normal LFTs, lipase.  Imaging reveals -chest x-ray without acute cardiopulmonary findings.  Initial EKG done at 2222 showed ST depressions in inferior lateral leads with sinus tachycardia heart rate 111.  Repeat EKG at 2235 showed dynamic changes no ST depressions anymore but some PACs and prolonged QTC.  Medications ordered - zofran, aspirin. She reports headache on arrival, tylenol given.  Ordered continuous cardiac and pulse ox monitoring.  Will plan for serial re-examinations. Close monitoring.   2315: Re-evaluated the  patient. No CP. Ongoing epigastric soreness, nausea. Dry heaving, no emesis.   Given previous cardiac history, chest pain with shortness of breath, nausea, vomiting and EKG changes, troponin 31 feel patient warrant cardiology evaluation.   Consults - Cardiology Dr Nip fellow, will see patient in ER. Family updated. Patient care transferred to oncoming EDPA who will follow up on cardiology recommendations and determine disposition. Discussed with EDP [place].     Final Clinical Impression(s) / ED Diagnoses Final diagnoses:  Chest pain with high risk for cardiac etiology  Nausea and vomiting in adult    Rx / DC Orders ED Discharge Orders    None       Jerrell Mylar 05/12/20 2324    Cheryll Cockayne, MD 05/13/20 [phone]

## 2020-05-12 NOTE — ED Notes (Signed)
Patient transported to X-ray 

## 2020-05-12 NOTE — ED Triage Notes (Signed)
Per EMS, pt from home began to have substernal chest pain this morning, the pain is now moving to her back.  Started having N/V w/ EMS.  Extensive cardiac hx.  Attempted to give 324 ASA however she "threw them up."  She was given 4mg  IV ativan and two nitro.    18G R AC

## 2020-05-12 NOTE — ED Provider Notes (Signed)
87 year old female received at sign out from Gloria Rowland pending cardiology evaluation:   "Gloria Rowland is a 87 y.o. female with past medical history of CAD s/p CABG, HTN, HLD, cholecystectomy, hiatal hernia, dementia, recurrent UTIs presents to ER by EMS from home for evaluation of chest pain. Initial history obtained from patient who is alone in room. Patient is very HOH. I also called patient's POA daughter Gloria Rowland and daughter in law Gloria Rowland.  Patient reports earlier today she started "shaking uncontrollably" while she was sitting in her chair. She felt like she was going to die. Had nausea and vomiting 3-4 times. Then develop left sided chest pain. Doesn't know how to describe it or if it radiated anywhere. She reports upper back pain as well.  Her upper abdomen is "sore" but always stays sore. Had some shortness of breath like she was gasping for air but this has gone away now. Has persistent nausea and upper abdominal soreness now. No more chest pain. Also has a dry cough. Some burning with urination but not that bad. Tells me she is vaccinated for the virus, three times. Denies fever. Level 5 caveat due to dementia. EMS attempted to give 324 mg but patient threw it up. EMS gave 4 mg IV ativan and nitroglycerin twice.   Daughter/POA Gloria Rowland and daughter in law Gloria Rowland report patient and her husband went to eat a "biscuit" and drove to [place]. When they got back home patient reported nausea and started vomiting several times. She was shaking and said she felt like she was going to die. Family report patient has "moderate to advanced dementia" and frequently expresses she feels like she is going to die.  Patient then reported chest and back pain. No fever. They report history of constipation. She had open heart surgery 20 years ago by Dr Gloria Rowland who has since retired. Patient did not want to follow up with cardiology after Dr Gloria Rowland retired. Recent upper EGD. Confirm patient is vaccinated for COVID with a  booster. Lives at home with husband. On nitrofurantoin for chronic UTIs."  Physical Exam  BP (!) 181/91   Pulse 63   Temp 98.3 F (36.8 C) (Oral)   Resp (!) 23   Ht 5\' 4"  (1.626 m)   Wt 51.7 kg   SpO2 97%   BMI 19.56 kg/m   Physical Exam Vitals and nursing note reviewed.  Constitutional:      Appearance: She is well-developed and well-nourished.     Comments: Elderly female in no acute distress.  HENT:     Head: Normocephalic and atraumatic.  Cardiovascular:     Rate and Rhythm: Normal rate.     Pulses: Normal pulses.  Pulmonary:     Effort: Pulmonary effort is normal.  Abdominal:     Tenderness: There is abdominal tenderness.     Comments: Tender to palpation in the epigastric region. Abdomen is soft and nondistended.  Musculoskeletal:        General: Normal range of motion.     Cervical back: Normal range of motion.  Neurological:     Mental Status: She is alert.     Comments: Alert. There is a 4 extremity spontaneously.     ED Course/Procedures   Clinical Course as of 05/13/20 0431  05/15/20 May 12, 2020  2230 [facility] 2013 ANGIOGRAPHIC DATA:    CORONARY ARTERIES:   Arise and distribute normally.  Right dominant. Significant coronary calcification is noted.  Left main coronary artery: Mild calcification of the distal  left main with mild 10- 20% narrowing..  Left anterior descending: Calcified and occluded proximally. Fills by collaterals from the sequential mammary graft.  Circumflex coronary artery: Calcified with moderate ectasia and tortuosity but no significant focal narrowing noted..  Right coronary artery: Calcified somewhat. There is a moderate 40% ostial stenosis. Moderate irregularities noted but no significant obstructive stenosis is noted.  Internal mammary graft to LAD/diagonal: Widely patent.  LEFT VENTRICULOGRAM:  Performed in the 30 RAO projection.  The aortic valve is normal. There is dense mitral annular calcification noted. The left  ventricle is normal in size with normal wall motion. The estimated ejection fraction is 60%.   IMPRESSIONS: 1. Widely patent internal mammary graft done sequentially to the LAD and diagonal branch with occlusion of the LAD 2. Calcification and mild coronary disease involving the circumflex and right coronary artery 3. Normal left ventricle.Marland Kitchen  RECOMMENDATION:  Evaluation for other sources of pain. [CG]  2235 EKG 12-Lead Sinus tachycardia with 1st degree A-V block Septal infarct , age undetermined Marked ST abnormality, possible inferior subendocardial injury Abnormal ECG Confirmed by Gloria Rowland ([id number]) on 05/12/2020 10:22:46 PM [CG]  2236 EKG 12-Lead Sinus rhythm Atrial premature complexes Short PR interval Anteroseptal infarct, age indeterminate Prolonged QT interval Confirmed by Gloria Rowland ([id number]) on 05/12/2020 10:35:27 PM [CG]  2307 WBC: 10.5 [CG]  2307 Hemoglobin(!): 10.8 [CG]  2307 Potassium(!): 2.9 [CG]  2307 Glucose(!): 227 [CG]  2307 Creatinine(!): 1.10 [CG]  2307 GFR, Estimated(!): 49 [CG]  2307 BUN: 15 [CG]  2307 Anion gap(!): 16 [CG]  2308 Lipase: 25 [CG]  2308 AST: 36 [CG]  2308 ALT: 15 [CG]  2308 Alkaline Phosphatase: 56 [CG]  2308 Troponin I (High Sensitivity)(!): 31 [CG]  2308 EKG 12-Lead Sinus rhythm Atrial premature complexes Short PR interval Anteroseptal infarct, age indeterminate Prolonged QT interval 534 Confirmed by Gloria Rowland ([id number]) on 05/12/2020 10:35:27 PM [CG]  2309 EGD 2018 - Normal esophagus. - Normal esophagus. Outpatient - Z-line irregular, 40 cm from the incisors. - 2 cm hiatal hernia. - Normal stomach. - Normal duodenal bulb and second portion of the duodenum. - No specimens collected. [CG]  2311 CTAP 2020 IMPRESSION: 1. No clear acute abdominopelvic findings. 2. Chronic dilatation of the extrahepatic bile ducts following cholecystectomy. 3. Chronic LEFT UPJ obstruction. 4. Stable small cystic lesion towards the head of the  pancreas. 5. No bowel obstruction. [CG]    Clinical Course User Index [CG] Gloria Handy, PA-C    .Critical Care Performed by: Barkley Boards, PA-C Authorized by: Barkley Boards, PA-C   Critical care provider statement:    Critical care time (minutes):  30   Critical care time was exclusive of:  Separately billable procedures and treating other patients and teaching time   Critical care was necessary to treat or prevent imminent or life-threatening deterioration of the following conditions:  Cardiac failure   Critical care was time spent personally by me on the following activities:  Ordering and review of laboratory studies, pulse oximetry, re-evaluation of patient's condition, review of old charts, obtaining history from patient or surrogate, evaluation of patient's response to treatment, development of treatment plan with patient or surrogate, discussions with consultants and examination of patient   I assumed direction of critical care for this patient from another provider in my specialty: yes     Care discussed with: admitting provider      MDM  87 year old female received at signout from [facility] pending cardiology evaluation.  Please see  her note for further work-up and medical decision making.  In brief, this is an 87 year old female brought for evaluation of chest pain/epigastric pain in the setting of nausea, vomiting, and upper back pain.  Earlier, she had an episode where she was "shaking uncontrollably" in a chair.  Level 5 caveat secondary to dementia.  History obtained from chart review and the patient's family.  Initial EKG at 2222 demonstrated ST depressions in inferior lateral leads of sinus tachycardia with a heart rate of 111.  Repeat EKG at 2235 showed dynamic changes, but no ST segment depressions.  There were some PACs and prolonged QTC.  She was given Zofran and aspirin.  Tylenol given for headache on arrival.  Work-up prior to assuming care of the patient  was significant for a troponin of 31.  In the setting of a previous cardiac history, chest pain with shortness of breath, nausea, vomiting, and changes on EKG. Dr. Julianne Handler with cardiology has seen the patient and recommends primary management of abdominal pain, nausea, and vomiting with complicating hypokalemia and elevated anion gap metabolic acidosis, which is new in the setting of hyperglycemia.  Cardiology recommends ASA 325x1 and continuing 81 mg of ASA daily.  He recommends trending troponins.  If troponins continue to increase, then heparin infusion 48 hours, but low threshold to discontinue if there is any concern for bleeding.  Please see his note for additional recommendations.  Cardiology consult appreciated.  Labs and imaging have been been reviewed and independently interpreted by me.  Glucose 227.  Bicarb is 19 with an anion gap of 16.  This could be secondary to DKA.  Will add on BHA and i-STAT venous blood gas.  She has no history of heart failure.  We will add on 500 cc fluid bolus of normal saline.  She is also hypokalemic at 2.9.  Says she has been having vomiting, IV potassium x2 has been administered.  Hypertension has been slightly uptrending since arrival in the ER.  Blood pressure is now 180s over 90s.  Cardiology recommended resuming her home lisinopril, which has been ordered.  Given need for continued observation of her troponin levels, the patient will require admission. Consult to the hospitalist team and Dr. Antionette Char will accept the patient for admission.  The patient appears reasonably stabilized for admission considering the current resources, flow, and capabilities available in the ED at this time, and I doubt any other [facility] requiring further screening and/or treatment in the ED prior to admission.        Barkley Boards, PA-C 05/13/20 0431    Dione Booze, MD 05/13/20 [phone]

## 2020-05-13 ENCOUNTER — Encounter (HOSPITAL_COMMUNITY): Payer: Self-pay | Admitting: Family Medicine

## 2020-05-13 ENCOUNTER — Inpatient Hospital Stay (HOSPITAL_COMMUNITY): Payer: Medicare Other

## 2020-05-13 DIAGNOSIS — E876 Hypokalemia: Secondary | ICD-10-CM

## 2020-05-13 DIAGNOSIS — N1831 Chronic kidney disease, stage 3a: Secondary | ICD-10-CM

## 2020-05-13 DIAGNOSIS — K449 Diaphragmatic hernia without obstruction or gangrene: Secondary | ICD-10-CM | POA: Diagnosis not present

## 2020-05-13 DIAGNOSIS — E43 Unspecified severe protein-calorie malnutrition: Secondary | ICD-10-CM | POA: Diagnosis not present

## 2020-05-13 DIAGNOSIS — R140 Abdominal distension (gaseous): Secondary | ICD-10-CM | POA: Diagnosis not present

## 2020-05-13 DIAGNOSIS — R079 Chest pain, unspecified: Secondary | ICD-10-CM | POA: Diagnosis not present

## 2020-05-13 DIAGNOSIS — I214 Non-ST elevation (NSTEMI) myocardial infarction: Secondary | ICD-10-CM | POA: Diagnosis not present

## 2020-05-13 DIAGNOSIS — I129 Hypertensive chronic kidney disease with stage 1 through stage 4 chronic kidney disease, or unspecified chronic kidney disease: Secondary | ICD-10-CM | POA: Diagnosis not present

## 2020-05-13 DIAGNOSIS — R109 Unspecified abdominal pain: Secondary | ICD-10-CM | POA: Diagnosis not present

## 2020-05-13 DIAGNOSIS — Z681 Body mass index (BMI) 19 or less, adult: Secondary | ICD-10-CM | POA: Diagnosis not present

## 2020-05-13 DIAGNOSIS — F0390 Unspecified dementia without behavioral disturbance: Secondary | ICD-10-CM | POA: Diagnosis not present

## 2020-05-13 DIAGNOSIS — F32A Depression, unspecified: Secondary | ICD-10-CM | POA: Diagnosis present

## 2020-05-13 DIAGNOSIS — Z66 Do not resuscitate: Secondary | ICD-10-CM | POA: Diagnosis not present

## 2020-05-13 DIAGNOSIS — J9811 Atelectasis: Secondary | ICD-10-CM | POA: Diagnosis not present

## 2020-05-13 DIAGNOSIS — I10 Essential (primary) hypertension: Secondary | ICD-10-CM | POA: Diagnosis not present

## 2020-05-13 DIAGNOSIS — R739 Hyperglycemia, unspecified: Secondary | ICD-10-CM

## 2020-05-13 DIAGNOSIS — I5041 Acute combined systolic (congestive) and diastolic (congestive) heart failure: Secondary | ICD-10-CM | POA: Diagnosis not present

## 2020-05-13 DIAGNOSIS — Z515 Encounter for palliative care: Secondary | ICD-10-CM | POA: Diagnosis not present

## 2020-05-13 DIAGNOSIS — R1110 Vomiting, unspecified: Secondary | ICD-10-CM | POA: Diagnosis not present

## 2020-05-13 DIAGNOSIS — R1013 Epigastric pain: Secondary | ICD-10-CM | POA: Diagnosis not present

## 2020-05-13 DIAGNOSIS — Z7982 Long term (current) use of aspirin: Secondary | ICD-10-CM | POA: Diagnosis not present

## 2020-05-13 DIAGNOSIS — E7800 Pure hypercholesterolemia, unspecified: Secondary | ICD-10-CM | POA: Diagnosis not present

## 2020-05-13 DIAGNOSIS — E872 Acidosis: Secondary | ICD-10-CM | POA: Diagnosis not present

## 2020-05-13 DIAGNOSIS — Z951 Presence of aortocoronary bypass graft: Secondary | ICD-10-CM | POA: Diagnosis not present

## 2020-05-13 DIAGNOSIS — Z833 Family history of diabetes mellitus: Secondary | ICD-10-CM | POA: Diagnosis not present

## 2020-05-13 DIAGNOSIS — Z886 Allergy status to analgesic agent status: Secondary | ICD-10-CM | POA: Diagnosis not present

## 2020-05-13 DIAGNOSIS — Z87440 Personal history of urinary (tract) infections: Secondary | ICD-10-CM | POA: Diagnosis not present

## 2020-05-13 DIAGNOSIS — R0789 Other chest pain: Secondary | ICD-10-CM | POA: Diagnosis not present

## 2020-05-13 DIAGNOSIS — J90 Pleural effusion, not elsewhere classified: Secondary | ICD-10-CM | POA: Diagnosis not present

## 2020-05-13 DIAGNOSIS — Z79899 Other long term (current) drug therapy: Secondary | ICD-10-CM | POA: Diagnosis not present

## 2020-05-13 DIAGNOSIS — D649 Anemia, unspecified: Secondary | ICD-10-CM | POA: Diagnosis present

## 2020-05-13 DIAGNOSIS — Z20822 Contact with and (suspected) exposure to covid-19: Secondary | ICD-10-CM | POA: Diagnosis not present

## 2020-05-13 DIAGNOSIS — Z885 Allergy status to narcotic agent status: Secondary | ICD-10-CM | POA: Diagnosis not present

## 2020-05-13 DIAGNOSIS — R112 Nausea with vomiting, unspecified: Secondary | ICD-10-CM | POA: Diagnosis not present

## 2020-05-13 DIAGNOSIS — F419 Anxiety disorder, unspecified: Secondary | ICD-10-CM | POA: Diagnosis present

## 2020-05-13 DIAGNOSIS — E785 Hyperlipidemia, unspecified: Secondary | ICD-10-CM | POA: Diagnosis not present

## 2020-05-13 DIAGNOSIS — I2510 Atherosclerotic heart disease of native coronary artery without angina pectoris: Secondary | ICD-10-CM | POA: Diagnosis not present

## 2020-05-13 DIAGNOSIS — I351 Nonrheumatic aortic (valve) insufficiency: Secondary | ICD-10-CM

## 2020-05-13 DIAGNOSIS — K219 Gastro-esophageal reflux disease without esophagitis: Secondary | ICD-10-CM | POA: Diagnosis present

## 2020-05-13 LAB — I-STAT VENOUS BLOOD GAS, ED
Acid-Base Excess: 1 mmol/L (ref 0.0–2.0)
Bicarbonate: 23.6 mmol/L (ref 20.0–28.0)
Calcium, Ion: 0.98 mmol/L — ABNORMAL LOW (ref 1.15–1.40)
HCT: 29 % — ABNORMAL LOW (ref 36.0–46.0)
Hemoglobin: 9.9 g/dL — ABNORMAL LOW (ref 12.0–15.0)
O2 Saturation: 99 %
Potassium: 3.4 mmol/L — ABNORMAL LOW (ref 3.5–5.1)
Sodium: 141 mmol/L (ref 135–145)
TCO2: 24 mmol/L (ref 22–32)
pCO2, Ven: 29.4 mmHg — ABNORMAL LOW (ref 44.0–60.0)
pH, Ven: 7.513 — ABNORMAL HIGH (ref 7.250–7.430)
pO2, Ven: 138 mmHg — ABNORMAL HIGH (ref 32.0–45.0)

## 2020-05-13 LAB — CBC
HCT: 32.3 % — ABNORMAL LOW (ref 36.0–46.0)
Hemoglobin: 10.9 g/dL — ABNORMAL LOW (ref 12.0–15.0)
MCH: 32.1 pg (ref 26.0–34.0)
MCHC: 33.7 g/dL (ref 30.0–36.0)
MCV: 95 fL (ref 80.0–100.0)
Platelets: 213 10*3/uL (ref 150–400)
RBC: 3.4 MIL/uL — ABNORMAL LOW (ref 3.87–5.11)
RDW: 13.2 % (ref 11.5–15.5)
WBC: 9.5 10*3/uL (ref 4.0–10.5)
nRBC: 0 % (ref 0.0–0.2)

## 2020-05-13 LAB — URINALYSIS, ROUTINE W REFLEX MICROSCOPIC
Bacteria, UA: NONE SEEN
Bilirubin Urine: NEGATIVE
Glucose, UA: 50 mg/dL — AB
Hgb urine dipstick: NEGATIVE
Ketones, ur: 5 mg/dL — AB
Nitrite: NEGATIVE
Protein, ur: 30 mg/dL — AB
Specific Gravity, Urine: 1.012 (ref 1.005–1.030)
pH: 8 (ref 5.0–8.0)

## 2020-05-13 LAB — HEPARIN LEVEL (UNFRACTIONATED)
Heparin Unfractionated: 0.4 IU/mL (ref 0.30–0.70)
Heparin Unfractionated: 0.29 IU/mL — ABNORMAL LOW (ref 0.30–0.70)

## 2020-05-13 LAB — ECHOCARDIOGRAM COMPLETE
Area-P 1/2: 2.56 cm2
Height: 64 in
S' Lateral: 2.9 cm
Weight: 1823.65 oz

## 2020-05-13 LAB — BASIC METABOLIC PANEL
Anion gap: 15 (ref 5–15)
BUN: 13 mg/dL (ref 8–23)
CO2: 19 mmol/L — ABNORMAL LOW (ref 22–32)
Calcium: 8.5 mg/dL — ABNORMAL LOW (ref 8.9–10.3)
Chloride: 105 mmol/L (ref 98–111)
Creatinine, Ser: 0.91 mg/dL (ref 0.44–1.00)
GFR, Estimated: >60 mL/min (ref >60)
Glucose, Bld: 168 mg/dL — ABNORMAL HIGH (ref 70–99)
Potassium: 3.5 mmol/L (ref 3.5–5.1)
Sodium: 139 mmol/L (ref 135–145)

## 2020-05-13 LAB — CBG MONITORING, ED
Glucose-Capillary: 143 mg/dL — ABNORMAL HIGH (ref 70–99)
Glucose-Capillary: 153 mg/dL — ABNORMAL HIGH (ref 70–99)
Glucose-Capillary: 112 mg/dL — ABNORMAL HIGH (ref 70–99)
Glucose-Capillary: 112 mg/dL — ABNORMAL HIGH (ref 70–99)

## 2020-05-13 LAB — BETA-HYDROXYBUTYRIC ACID: Beta-Hydroxybutyric Acid: 0.34 mmol/L — ABNORMAL HIGH (ref 0.05–0.27)

## 2020-05-13 LAB — TROPONIN I (HIGH SENSITIVITY)
Troponin I (High Sensitivity): 169 ng/L (ref <18)
Troponin I (High Sensitivity): 532 ng/L (ref <18)
Troponin I (High Sensitivity): 789 ng/L (ref <18)

## 2020-05-13 LAB — HEMOGLOBIN A1C
Hgb A1c MFr Bld: 5.3 % (ref 4.8–5.6)
Mean Plasma Glucose: 105.41 mg/dL

## 2020-05-13 LAB — LACTIC ACID, PLASMA: Lactic Acid, Venous: 0.8 mmol/L (ref 0.5–1.9)

## 2020-05-13 LAB — MRSA PCR SCREENING: MRSA by PCR: NEGATIVE

## 2020-05-13 LAB — MAGNESIUM: Magnesium: 1.8 mg/dL (ref 1.7–2.4)

## 2020-05-13 MED ORDER — IOHEXOL 350 MG/ML SOLN
100.0000 mL | Freq: Once | INTRAVENOUS | Status: AC | PRN
  Administered 2020-05-13: 100 mL via INTRAVENOUS

## 2020-05-13 MED ORDER — LISINOPRIL 20 MG PO TABS: 20.0000 mg | ORAL_TABLET | Freq: Two times a day (BID) | ORAL | Status: DC

## 2020-05-13 MED ORDER — ISOSORBIDE MONONITRATE ER 30 MG PO TB24
30.0000 mg | ORAL_TABLET | Freq: Every day | ORAL | Status: DC
  Administered 2020-05-13 – 2020-05-16 (×4): 30 mg via ORAL
  Filled 2020-05-13 (×5): qty 1

## 2020-05-13 MED ORDER — HEPARIN (PORCINE) 25000 UT/250ML-% IV SOLN
800.0000 [IU]/h | INTRAVENOUS | Status: AC
  Administered 2020-05-13: 650 [IU]/h via INTRAVENOUS
  Administered 2020-05-14: 700 [IU]/h via INTRAVENOUS
  Filled 2020-05-13 (×2): qty 250

## 2020-05-13 MED ORDER — INSULIN ASPART 100 UNIT/ML ~~LOC~~ SOLN: 0.0000 [IU] | SUBCUTANEOUS | Status: DC

## 2020-05-13 MED ORDER — PROMETHAZINE HCL 25 MG/ML IJ SOLN
12.5000 mg | INTRAMUSCULAR | Status: DC | PRN
  Administered 2020-05-13: 12.5 mg via INTRAVENOUS
  Filled 2020-05-13: qty 1

## 2020-05-13 MED ORDER — SODIUM CHLORIDE 0.9 % IV BOLUS
500.0000 mL | Freq: Once | INTRAVENOUS | Status: AC
  Administered 2020-05-13: 500 mL via INTRAVENOUS

## 2020-05-13 MED ORDER — ASPIRIN EC 81 MG PO TBEC
81.0000 mg | DELAYED_RELEASE_TABLET | Freq: Every day | ORAL | Status: DC
  Administered 2020-05-13 – 2020-05-16 (×4): 81 mg via ORAL
  Filled 2020-05-13 (×4): qty 1

## 2020-05-13 MED ORDER — FENTANYL CITRATE (PF) 100 MCG/2ML IJ SOLN
50.0000 ug | Freq: Once | INTRAMUSCULAR | Status: AC
  Administered 2020-05-13: 50 ug via INTRAVENOUS
  Filled 2020-05-13: qty 2

## 2020-05-13 MED ORDER — HEPARIN BOLUS VIA INFUSION
3000.0000 [IU] | Freq: Once | INTRAVENOUS | Status: AC
  Administered 2020-05-13: 3000 [IU] via INTRAVENOUS
  Filled 2020-05-13: qty 3000

## 2020-05-13 MED ORDER — ONDANSETRON HCL 4 MG/2ML IJ SOLN
4.0000 mg | Freq: Four times a day (QID) | INTRAMUSCULAR | Status: DC | PRN
  Administered 2020-05-13 – 2020-05-14 (×2): 4 mg via INTRAVENOUS
  Filled 2020-05-13 (×2): qty 2

## 2020-05-13 MED ORDER — AMLODIPINE BESYLATE 5 MG PO TABS
5.0000 mg | ORAL_TABLET | Freq: Every day | ORAL | Status: DC
  Administered 2020-05-13 – 2020-05-14 (×2): 5 mg via ORAL
  Filled 2020-05-13 (×2): qty 1

## 2020-05-13 MED ORDER — NITROGLYCERIN 0.4 MG SL SUBL
0.4000 mg | SUBLINGUAL_TABLET | SUBLINGUAL | Status: DC | PRN
Start: 2020-05-13 — End: 2020-05-16
  Administered 2020-05-13 (×2): 0.4 mg via SUBLINGUAL
  Filled 2020-05-13: qty 1

## 2020-05-13 MED ORDER — METOPROLOL SUCCINATE ER 25 MG PO TB24
25.0000 mg | ORAL_TABLET | Freq: Every day | ORAL | Status: DC
  Administered 2020-05-14 – 2020-05-15 (×2): 25 mg via ORAL
  Filled 2020-05-13 (×2): qty 1

## 2020-05-13 MED ORDER — LISINOPRIL 20 MG PO TABS
20.0000 mg | ORAL_TABLET | Freq: Once | ORAL | Status: AC
  Administered 2020-05-13: 20 mg via ORAL
  Filled 2020-05-13: qty 1

## 2020-05-13 MED ORDER — POTASSIUM CHLORIDE IN NACL 40-0.9 MEQ/L-% IV SOLN
INTRAVENOUS | Status: DC
  Filled 2020-05-13 (×3): qty 1000

## 2020-05-13 MED ORDER — ROSUVASTATIN CALCIUM 20 MG PO TABS
20.0000 mg | ORAL_TABLET | Freq: Every day | ORAL | Status: DC
  Administered 2020-05-13 – 2020-05-16 (×4): 20 mg via ORAL
  Filled 2020-05-13 (×2): qty 1
  Filled 2020-05-13: qty 4
  Filled 2020-05-13: qty 1

## 2020-05-13 MED ORDER — LISINOPRIL 20 MG PO TABS
20.0000 mg | ORAL_TABLET | Freq: Every day | ORAL | Status: DC
  Administered 2020-05-13 – 2020-05-16 (×4): 20 mg via ORAL
  Filled 2020-05-13 (×4): qty 1

## 2020-05-13 MED ORDER — PANTOPRAZOLE SODIUM 40 MG PO TBEC
40.0000 mg | DELAYED_RELEASE_TABLET | Freq: Every day | ORAL | Status: DC
  Administered 2020-05-13 – 2020-05-16 (×4): 40 mg via ORAL
  Filled 2020-05-13 (×4): qty 1

## 2020-05-13 MED ORDER — ACETAMINOPHEN 325 MG PO TABS
650.0000 mg | ORAL_TABLET | ORAL | Status: DC | PRN
  Administered 2020-05-14 (×3): 650 mg via ORAL
  Filled 2020-05-13 (×3): qty 2

## 2020-05-13 MED ORDER — POTASSIUM CHLORIDE CRYS ER 20 MEQ PO TBCR: 20.0000 meq | EXTENDED_RELEASE_TABLET | Freq: Two times a day (BID) | ORAL | Status: DC

## 2020-05-13 NOTE — ED Notes (Signed)
Dr. Preston Fleeting made aware of critical troponin 169.

## 2020-05-13 NOTE — Consult Note (Signed)
Cardiology Consult    Patient ID: Gloria Rowland MRN: [id number], DOB/AGE: 1932/08/24   Admit date: 05/12/2020 Date of Consult: 05/13/2020  Primary Physician: Ignatius Specking, MD Primary Cardiologist: No primary care provider on file. Requesting Provider: Frederik Pear, PA  Patient Profile    Gloria Rowland is a 87 y.o. female with a history of dementia, corornary artery disease with 1998 LIMA --> LAD/D1, previously followed by Dr. Donnie Aho, presents with non-specific complaints including nausea, vomiting, abdominal pain, chest pain on whom we are consulted for abnormal ECG and abnormal troponin  History of Present Illness    87 year old female with history as above who I evaluated in ED with her daughter Gloria Rowland.  She gives conflicting history at times given underlying dementia and her daughter helps fill in gaps.  She went with her husband ([id number]) to [place] today to do some shopping and have lunch.  There she had a biscuit and developed abdominal pain (her stomach "swelled up like a rock") with subsequent nausea and vomiting.  She has had ongoing nausea and vomiting, most recently about 15 minutes prior to my evaluation.  She says that at around the time of the vomiting she had some chest pain and back pain.  Denies any ongoing chest pain at this time.  ECG reviewed with some mild anterolateral depressions in the setting of sinus tachycardia at presentation. Second ECG with normal sinus rhythm, occasional PACs, nearly resolved lateral ST depressions.  Troponin 31-->169  Past Medical History   Past Medical History:  Diagnosis Date  . Baker's cyst, ruptured   . CAD (coronary artery disease)   . Dementia (HCC)   . History of hepatitis   . Hyperlipidemia   . Hypertension   . Lumbar degenerative disc disease   . Nephrolithiasis   . Spasmodic torticollis     Past Surgical History:  Procedure Laterality Date  . ABDOMINAL HYSTERECTOMY    . CARPAL TUNNEL RELEASE    . CHOLECYSTECTOMY     . CORONARY ARTERY BYPASS GRAFT  1998   LIMA to LAD-Dx  . ESOPHAGOGASTRODUODENOSCOPY N/A 01/15/2017   Procedure: ESOPHAGOGASTRODUODENOSCOPY (EGD);  Surgeon: Malissa Hippo, MD;  Location: [facility] SUITE;  Service: Endoscopy;  Laterality: N/A;  1:00  . KIDNEY STONE SURGERY    . LEFT HEART CATHETERIZATION WITH CORONARY/GRAFT ANGIOGRAM N/A 05/26/2011   Procedure: LEFT HEART CATHETERIZATION WITH Isabel Caprice;  Surgeon: Othella Boyer, MD;  Location: [facility] CATH LAB;  Service: Cardiovascular;  Laterality: N/A;  . LUMBAR LAMINECTOMY       Allergies  Allergen Reactions  . Aspirin Nausea And Vomiting    Upset stomach  . Codeine Nausea And Vomiting    Upset stomach   Inpatient Medications      Family History    Family History  Problem Relation Age of Onset  . Diabetes Sister   . Diabetes Sister    She indicated that her mother is deceased. She indicated that her father is deceased. She indicated that two of her three sisters are alive. She indicated that her child is alive. She indicated that her other is alive.   Social History    Social History   Socioeconomic History  . Marital status: Married    Spouse name: Not on file  . Number of children: Not on file  . Years of education: Not on file  . Highest education level: Not on file  Occupational History  . Not on file  Tobacco Use  .  Smoking status: Never Smoker  . Smokeless tobacco: Never Used  Vaping Use  . Vaping Use: Never used  Substance and Sexual Activity  . Alcohol use: No  . Drug use: No  . Sexual activity: Not on file  Other Topics Concern  . Not on file  Social History Narrative   Divorced, remarried.    Social Determinants of Health   Financial Resource Strain: Not on file  Food Insecurity: Not on file  Transportation Needs: Not on file  Physical Activity: Not on file  Stress: Not on file  Social Connections: Not on file  Intimate Partner Violence: Not on file     Review of Systems     General:  No chills, fever, night sweats or weight changes.  Cardiovascular:  No chest pain, dyspnea on exertion, edema, orthopnea, palpitations, paroxysmal nocturnal dyspnea. Dermatological: No rash, lesions/masses Respiratory: No cough, dyspnea Urologic: No hematuria, dysuria Abdominal:   No nausea, vomiting, diarrhea, bright red blood per rectum, melena, or hematemesis Neurologic:  No visual changes, wkns, changes in mental status. All other systems reviewed and are otherwise negative except as noted above.  Physical Exam    Blood pressure (!) 181/90, pulse (!) 52, temperature 98.3 F (36.8 C), temperature source Oral, resp. rate 19, height 5\' 4"  (1.626 m), weight 51.7 kg, SpO2 100 %.    No intake or output data in the 24 hours ending 05/13/20 0021 Wt Readings from Last 3 Encounters:  05/12/20 51.7 kg  04/10/18 51.7 kg  12/07/17 63.5 kg    CONSTITUTIONAL: alert and conversant, well-appearing, nourished, no distress HEENT: oropharynx clear and moist, no mucosal lesions, normal dentition, conjunctiva normal, EOM intact, pupils equal, no lid lag. NECK: supple, no cervical adenopathy, no thyromegaly CARDIOVASCULAR: Regular rhythm. No gallop, murmur, or rub. Normal S1/S2. Radial pulses intact. JVP flat. No carotid bruits. PULMONARY/CHEST WALL: no deformities, normal breath sounds bilaterally, normal work of breathing.  Prior sternotomy ABDOMINAL: soft, non-tender, non-distended EXTREMITIES: no edema or muscle atrophy, warm and well-perfused SKIN: Dry and intact without apparent rashes or wounds. NEUROLOGIC: alert, normal gait, no abnormal movements, cranial nerves grossly intact.   Labs    K 2.9, GFR 227, HCO3 19 w/ AGMA 16, LFTs OK, Hgb 10.8 Troponin 31-->169  Radiology Studies    DG Chest 2 View  Result Date: 05/12/2020 CLINICAL DATA:  Substernal chest pain. EXAM: CHEST - 2 VIEW COMPARISON:  May 01, 2017 FINDINGS: Multiple sternal wires are noted. The lungs are mildly  hyperinflated. There is no evidence of acute infiltrate, pleural effusion or pneumothorax. The cardiac silhouette is mildly enlarged. There is moderate severity calcification of the thoracic aorta. Degenerative changes seen throughout the thoracic spine. IMPRESSION: Stable exam without active cardiopulmonary disease. Electronically Signed   By: May 03, 2017 M.D.   On: 05/12/2020 22:17    ECG & Cardiac Imaging    None available.  05/26/11 LHC w/ angiogram w/ patent sequential LIMA --> LAD --> Diagonal, mild disease of RCA and LCx ostial but nothing obstructive.  LAD occluded.  Assessment & Plan    87 year old female with history of coronary artery disease s/p LIMA --> LAD and diagonal (sequential graft) with occluded LAD and otherwise nonobstructive coronary disease presenting with nausea and vomiting and brief episode of chest pain in the setting of tachycardia found to have mild troponin elevation with some transient ST depression that has now resolved.  Suspect demand ischemia in the setting of chronic CAD.  I discussed with the family  at bedside that unless she had a large event, refractory chest pain, or urgent/emergent indication I would not favor an invasive approach and her daughter and the patient (as much as she can participate) were in agreement.  Recommend primary management of chief complaint of abdominal pain, nausea, vomiting with complicating hypokalemia, anion gap metabolic acidosis (new from prior), hyperglycemia.  #Problem list Nausea/vomiting Hypokalemia Metabolic acidosis Troponin elevation History of LIMA-->LAD CABG Severe hypertension Anemia Dementia  Recommendations #Chest pain, isolated #Abnormal troponin - ASA 325 mg x1 and continue ASA 81 mg daily - Trend troponin.  If continues to increase then heparin infusion 48 hours but low threshold to discontinue if any concern for bleeding. - Increase rosuvastatin to 20 mg daily (high intensity)  #History of CAD  s/p CABG #HTN - Statin, antipaltelet as above - Resume home antihypertensives (lisinopril 20 mg daily, amlodipine 5 mg daily).  If tolerated recommend additional long acting nitrate and low dose beta blocker for antianginal effect.  Signed, Regino Schultze, MD 05/13/2020, 12:21 AM  For questions or updates, please contact   Please consult [email or web address] for contact info under Cardiology/STEMI.

## 2020-05-13 NOTE — Progress Notes (Addendum)
Progress Note    Gloria Rowland  GUR:[id number] DOB: Jan 07, 1933  DOA: 05/12/2020 PCP: Ignatius Specking, MD    Brief Narrative:   Chief complaint:  Chest pain, nausea, and vomiting.   Medical records reviewed and are as summarized below:  Gloria Rowland is an 87 y.o. female with medical history significant for CAD status post CABG in 1998, hypertension, hyperlipidemia, and dementia, who presented to the ED for evaluation of chest pain, nausea, and vomiting. In the ED, noted to be tachycardic with nodal block.  CXR negative. TNI elevation from 31-->169. Cardiology subsequently consulted.   Assessment/Plan:   Principal Problem: Non-STEMI/demand ischemia associated with intractable N/V, POA  Patient with hx of CAD s/p CABG in 1998 presented with chest pain and N/V and was found to have new ST-depressions on EKG and elevated troponin to 169. Cardiology recommended continuing ACE-I, increasing statin intensity, ASA, starting IV heparin if troponin continued to rise (which it did to 532), and adding beta-blocker and long-acting nitrate if tolerated. Continues to have intractable N/V, not relieved by Zofran.  Added PRN phenergan. ? Ischemic colitis. Check lactic acid. Will get CTA of abdomen and pelvis to r/o mesenteric ischemia (discussed with family and they would like to proceed with work up but understand her frailty and would likely not want aggressive care). Will add Plavix if CTA shows no contraindication.   Active problems: Hypertension, chronic, POA  Continue lisinopril and Norvasc, and metoprolol.  BP 140s-150s/50's-70's.  Hypokalemia, POA  Serum potassium 2.9 on admission in setting of N/V. Replaced in ED, now 3.5.  Will add KCL to IVF. Given active N/V, oral KCL discontinued.  CKD IIIa, chronic, POA  SCr was 1.10 on admission, 0.9 this a.m.    Hyperglycemia, POA  Serum glucose 229 in ED without hx of DM. Hgb A1c 5.3%, likely a stress response.   Normocytic anemia,  POA Likely chronic, related to bone marrow sclerosis.  Dementia, chronic, POA  Continue delirium precautions, supportive care.   Severe protein calorie malnutrition, POA The patient has evidence of loss of muscle/fat stores and appears frail/underweight.  Will get dietician consult. Nutritional status        Body mass index is 19.56 kg/m.   Family Communication/Anticipated D/C date and plan/Code Status   DVT prophylaxis: IV heparin.   Current Level of Care:: Telemetry Cardiac Code Status: DNR Family Communication: Attempted to call spouse, no answer. Disposition Plan: Status is: Inpatient  Remains inpatient appropriate because:Inpatient level of care appropriate due to severity of illness: NSTEMI on IV heparin.   Dispo: The patient is from: Home              Anticipated d/c is to: To be determined. Lived with spouse but had help with daughter.              Anticipated d/c date is: 2 days              Patient currently is not medically stable to d/c.   Difficult to place patient No    Medical Consultants:    Cardiology   Anti-Infectives:    None  Subjective:   RN called me due to patient being in significant distress from nausea/vomiting. Given 12.5 mg of phenergan and is now sedated. Snoring loudly. Groggy when awakened, says she is having pain in her "waistline."  Objective:    Vitals:   05/13/20 0415 05/13/20 0430 05/13/20 0445 05/13/20 0500  BP: 121/83 134/64 (!) 156/72 Marland Kitchen)  147/57  Pulse: (!) 57 (!) 57 63 (!) 54  Resp: (!) 23 18 18  (!) 21  Temp:      TempSrc:      SpO2: 96% 98% 98% 96%  Weight:      Height:       No intake or output data in the 24 hours ending 05/13/20 05/15/20 Filed Weights   05/12/20 2126  Weight: 51.7 kg    Exam: General: Frail, elderly female, snoring loudly. Loss of fat stores with muscle wasting temporal and thenar areas. Cardiovascular: Heart sounds are irregular. II/VI systolic ejection murmur. No JVD. Lungs: Clear to  auscultation bilaterally with good air movement. No rales, rhonchi or wheezes. Abdomen: Soft, mildly tender, nondistended with normal active bowel sounds. No masses. No hepatosplenomegaly. Neurological: Lethargic/weak. Cranial nerves II through XII grossly intact. Skin: Warm and dry. Scattered ecchymosis. Extremities: No clubbing or cyanosis. No edema. Pedal pulses 2+. Psychiatric: Mood and affect are flat. Insight and judgment are impaired.     Data Reviewed:   I have personally reviewed following labs and imaging studies:  Labs: Labs show the following:   Basic Metabolic Panel: Recent Labs  Lab 05/12/20 2130 05/12/20 2328 05/13/20 0248 05/13/20 0257  NA 140  --  141 139  K 2.9*  --  3.4* 3.5  CL 105  --   --  105  CO2 19*  --   --  19*  GLUCOSE 227*  --   --  168*  BUN 15  --   --  13  CREATININE 1.10*  --   --  0.91  CALCIUM 8.8*  --   --  8.5*  MG  --  1.8  --   --    GFR Estimated Creatinine Clearance: 36.2 mL/min (by C-G formula based on SCr of 0.91 mg/dL). Liver Function Tests: Recent Labs  Lab 05/12/20 2203  AST 36  ALT 15  ALKPHOS 56  BILITOT 0.9  PROT 6.4*  ALBUMIN 3.8   Recent Labs  Lab 05/12/20 2203  LIPASE 25   CBC: Recent Labs  Lab 05/12/20 2130 05/13/20 0248 05/13/20 0257  WBC 10.5  --  9.5  HGB 10.8* 9.9* 10.9*  HCT 33.3* 29.0* 32.3*  MCV 95.1  --  95.0  PLT 239  --  213   CBG: Recent Labs  Lab 05/13/20 0510  GLUCAP 143*   Hgb A1c: Recent Labs    05/13/20 0257  HGBA1C 5.3    Microbiology Recent Results (from the past 240 hour(s))  Resp Panel by RT-PCR (Flu A&B, Covid) Nasopharyngeal Swab     Status: None   Collection Time: 05/12/20 10:23 PM   Specimen: Nasopharyngeal Swab; Nasopharyngeal(NP) swabs in vial transport medium  Result Value Ref Range Status   SARS Coronavirus 2 by RT PCR NEGATIVE NEGATIVE Final    Comment: (NOTE) SARS-CoV-2 target nucleic acids are NOT DETECTED.  The SARS-CoV-2 RNA is generally  detectable in upper respiratory specimens during the acute phase of infection. The lowest concentration of SARS-CoV-2 viral copies this assay can detect is 138 copies/mL. A negative result does not preclude SARS-Cov-2 infection and should not be used as the sole basis for treatment or other patient management decisions. A negative result may occur with  improper specimen collection/handling, submission of specimen other than nasopharyngeal swab, presence of viral mutation(s) within the areas targeted by this assay, and inadequate number of viral copies(<138 copies/mL). A negative result must be combined with clinical observations, patient history, and epidemiological  information. The expected result is Negative.  Fact Sheet for Patients:  BloggerCourse.com  Fact Sheet for Healthcare Providers:  SeriousBroker.it  This test is no t yet approved or cleared by the [place] FDA and  has been authorized for detection and/or diagnosis of SARS-CoV-2 by FDA under an Emergency Use Authorization (EUA). This EUA will remain  in effect (meaning this test can be used) for the duration of the COVID-19 declaration under Section 564(b)(1) of the Act, 21 U.S.C.section 360bbb-3(b)(1), unless the authorization is terminated  or revoked sooner.       Influenza A by PCR NEGATIVE NEGATIVE Final   Influenza B by PCR NEGATIVE NEGATIVE Final    Comment: (NOTE) The Xpert Xpress SARS-CoV-2/FLU/RSV plus assay is intended as an aid in the diagnosis of influenza from Nasopharyngeal swab specimens and should not be used as a sole basis for treatment. Nasal washings and aspirates are unacceptable for Xpert Xpress SARS-CoV-2/FLU/RSV testing.  Fact Sheet for Patients: BloggerCourse.com  Fact Sheet for Healthcare Providers: SeriousBroker.it  This test is not yet approved or cleared by the [place] FDA  and has been authorized for detection and/or diagnosis of SARS-CoV-2 by FDA under an Emergency Use Authorization (EUA). This EUA will remain in effect (meaning this test can be used) for the duration of the COVID-19 declaration under Section 564(b)(1) of the Act, 21 U.S.C. section 360bbb-3(b)(1), unless the authorization is terminated or revoked.  Performed at [facility], [address] [place], [place] [address]     Procedures and diagnostic studies:  DG Chest 2 View  Result Date: 05/12/2020 CLINICAL DATA:  Substernal chest pain. EXAM: CHEST - 2 VIEW COMPARISON:  May 01, 2017 FINDINGS: Multiple sternal wires are noted. The lungs are mildly hyperinflated. There is no evidence of acute infiltrate, pleural effusion or pneumothorax. The cardiac silhouette is mildly enlarged. There is moderate severity calcification of the thoracic aorta. Degenerative changes seen throughout the thoracic spine. IMPRESSION: Stable exam without active cardiopulmonary disease. Electronically Signed   By: Aram Candela M.D.   On: 05/12/2020 22:17    Medications:   . amLODipine  5 mg Oral Daily  . aspirin EC  81 mg Oral Daily  . insulin aspart  0-6 Units Subcutaneous Q4H  . lisinopril  20 mg Oral Daily  . metoprolol succinate  25 mg Oral Daily  . pantoprazole  40 mg Oral Daily  . rosuvastatin  20 mg Oral Daily   Continuous Infusions: . heparin 650 Units/hr (05/13/20 0511)     LOS: 0 days   Hillery Aldo, MD  [facility]   [facility] How to contact the [facility] Attending or Consulting provider 7A - 7P or covering provider during after hours 7P -7A, for this patient?  1. Check the care team in [facility] and look for a) attending/consulting [facility] provider listed and b) the [facility] team listed 2. Log into [email or web address] and use 's universal password to access. If you do not have the password, please contact the hospital operator. 3. Locate the [facility] provider you are looking for  under [facility] and page to a number that you can be directly reached. 4. If you still have difficulty reaching the provider, please page the [facility] (Director on Call) for the Hospitalists listed on amion for assistance.  05/13/2020, 7:22 AM

## 2020-05-13 NOTE — Progress Notes (Signed)
ANTICOAGULATION CONSULT NOTE - Initial Consult  Pharmacy Consult for Heparin Indication: chest pain/ACS  Allergies  Allergen Reactions  . Aspirin Nausea And Vomiting    Upset stomach  . Codeine Nausea And Vomiting    Upset stomach    Patient Measurements: Height: 5\' 4"  (162.6 cm) Weight: 51.7 kg (113 lb 15.7 oz) IBW/kg (Calculated) : 54.7 Heparin Dosing Weight: TBW  Vital Signs: Temp: 98.3 F (36.8 C) (02/20 2130) Temp Source: Oral (02/20 2130) BP: 181/91 (02/21 0245) Pulse Rate: 63 (02/21 0245)  Labs: Recent Labs    05/12/20 2130 05/12/20 2328 05/13/20 0248 05/13/20 0257  HGB 10.8*  --  9.9* 10.9*  HCT 33.3*  --  29.0* 32.3*  PLT 239  --   --  213  CREATININE 1.10*  --   --  0.91  TROPONINIHS 31* 169*  --  532*    Estimated Creatinine Clearance: 36.2 mL/min (by C-G formula based on SCr of 0.91 mg/dL).   Medical History: Past Medical History:  Diagnosis Date  . Baker's cyst, ruptured   . CAD (coronary artery disease)   . Dementia (HCC)   . History of hepatitis   . Hyperlipidemia   . Hypertension   . Lumbar degenerative disc disease   . Nephrolithiasis   . Spasmodic torticollis     Medications:  See electronic med rec  Assessment: 87 y.o. F presents with N/V/abd pain. Trop up to 169. To begin heparin for ACS. Plan for 48 hours. No AC PTA. CBC ok on admission.  Goal of Therapy:  Heparin level 0.3-0.7 units/ml Monitor platelets by anticoagulation protocol: Yes   Plan:  Heparin IV bolus 3000 units Heparin gtt at 650 units/hr Will f/u heparin level in 8 hours Daily heparin level and CBC   88 Gloria Rowland 05/13/2020,4:01 AM

## 2020-05-13 NOTE — H&P (Signed)
History and Physical    ELYSABETH AUST DQQ:[id number] DOB: 11-Dec-1932 DOA: 05/12/2020  PCP: Ignatius Specking, MD   Patient coming from: Home    Chief Complaint: Chest pain, N/V   HPI: Gloria Rowland is a 87 y.o. female with medical history significant for CAD status post CABG in 1998, hypertension, hyperlipidemia, and dementia, now presenting to the emergency department for evaluation of chest pain, nausea, and vomiting.  Patient reportedly appeared uncomfortable this morning, was tremulous, had several episodes of nausea and vomiting, and was complaining of chest pain.  Patient also reported some abdominal soreness which is described as chronic.  Patient was treated with aspirin prior to arrival in the emergency department but was unable to keep them down.  She was also given nitroglycerin and Ativan with EMS.  ED Course: Upon arrival to the ED, patient is found to be afebrile, saturating well on room air, and hypertensive to 189 systolic.  EKG features sinus tachycardia with rate 111, prescribed nodal block, and ST depressions inferiorly.  Chest x-ray is negative for acute cardiopulmonary disease.  Chemistry panel notable for glucose 227, potassium 2.9, bicarbonate 19, and creatinine 1.1.  CBC notable for stable normocytic anemia.  High-sensitivity troponin was elevated to 31 and then 169.  Cardiology was consulted by the ED physician and the patient was treated with return from milligrams aspirin, 20 mEq potassium, lisinopril, and IV fluids.  COVID-19 screening test is negative.  Review of Systems:  ROS limited by patient's clinical condition.  Past Medical History:  Diagnosis Date  . Baker's cyst, ruptured   . CAD (coronary artery disease)   . Dementia (HCC)   . History of hepatitis   . Hyperlipidemia   . Hypertension   . Lumbar degenerative disc disease   . Nephrolithiasis   . Spasmodic torticollis     Past Surgical History:  Procedure Laterality Date  . ABDOMINAL HYSTERECTOMY     . CARPAL TUNNEL RELEASE    . CHOLECYSTECTOMY    . CORONARY ARTERY BYPASS GRAFT  1998   LIMA to LAD-Dx  . ESOPHAGOGASTRODUODENOSCOPY N/A 01/15/2017   Procedure: ESOPHAGOGASTRODUODENOSCOPY (EGD);  Surgeon: Malissa Hippo, MD;  Location: [facility];  Service: Endoscopy;  Laterality: N/A;  1:00  . KIDNEY STONE SURGERY    . LEFT HEART CATHETERIZATION WITH CORONARY/GRAFT ANGIOGRAM N/A 05/26/2011   Procedure: LEFT HEART CATHETERIZATION WITH Isabel Caprice;  Surgeon: Othella Boyer, MD;  Location: [facility] CATH LAB;  Service: Cardiovascular;  Laterality: N/A;  . LUMBAR LAMINECTOMY      Social History:   reports that she has never smoked. She has never used smokeless tobacco. She reports that she does not drink alcohol and does not use drugs.  Allergies  Allergen Reactions  . Aspirin Nausea And Vomiting    Upset stomach  . Codeine Nausea And Vomiting    Upset stomach    Family History  Problem Relation Age of Onset  . Diabetes Sister   . Diabetes Sister      Prior to Admission medications   Medication Sig Start Date End Date Taking? Authorizing Provider  acetaminophen (TYLENOL) 650 MG CR tablet Take 650 mg by mouth every 6 (six) hours as needed for pain.    [provider]  amLODipine (NORVASC) 5 MG tablet Take 5 mg by mouth daily.    [provider]  aspirin EC 81 MG tablet Take 81 mg by mouth daily.    [provider]  citalopram (CELEXA) 10 MG  tablet Take 10 mg by mouth daily.    [provider]  Dextromethorphan-Guaifenesin (ROBITUSSIN COUGH+CHEST CONG DM) 5-100 MG/5ML LIQD Give 10 ml as needed for cough every 4 hours prn    [provider]  GEMFIBROZIL PO Take 300 mg by mouth 2 (two) times daily.    [provider]  HYDROcodone-acetaminophen (NORCO/VICODIN) 5-325 MG tablet Take 1 tablet by mouth every 6 (six) hours as needed. 08/20/17   Horton, Mayer Masker, MD  lisinopril (PRINIVIL,ZESTRIL) 20 MG tablet Take 20 mg by  mouth 2 (two) times daily.     [provider]  meclizine (ANTIVERT) 12.5 MG tablet Take 12.5 mg by mouth as needed for dizziness.    [provider]  memantine (NAMENDA) 10 MG tablet Take 10 mg by mouth 2 (two) times daily.    [provider]  ondansetron (ZOFRAN ODT) 4 MG disintegrating tablet Take 1 tablet (4 mg total) by mouth every 8 (eight) hours as needed for nausea or vomiting. 04/12/18   Tat, Onalee Hua, MD  pantoprazole (PROTONIX) 40 MG tablet Take 1 tablet (40 mg total) by mouth daily. 04/12/18   Catarina Hartshorn, MD  polyethylene glycol powder (GLYCOLAX/MIRALAX) powder Take 17 g by mouth daily as needed. For constipation. 12/04/16   [provider]  rosuvastatin (CRESTOR) 10 MG tablet Take 10 mg by mouth daily.    [provider]  trimethoprim (TRIMPEX) 100 MG tablet Take 100 mg by mouth daily.  04/26/17   [provider]  Vitamin D, Ergocalciferol, (DRISDOL) 50000 units CAPS capsule Take 50,000 Units by mouth every 7 (seven) days.    [provider]    Physical Exam: Vitals:   05/13/20 0100 05/13/20 0145 05/13/20 0230 05/13/20 0245  BP: (!) 147/67 (!) 181/97 (!) 159/64 (!) 181/91  Pulse: (!) 57 68 (!) 54 63  Resp: 16 (!) 28 20 (!) 23  Temp:      TempSrc:      SpO2: 96% 97% 94% 97%  Weight:      Height:        Constitutional: NAD, calm  Eyes: PERTLA, lids and conjunctivae normal ENMT: Mucous membranes are moist. Posterior pharynx clear of any exudate or lesions.   Neck: normal, supple, no masses, no thyromegaly Respiratory:  no wheezing, no crackles. No accessory muscle use.  Cardiovascular: S1 & S2 heard, regular rate and rhythm. No extremity edema.   Abdomen: No distension, no tenderness, soft. Bowel sounds active.  Musculoskeletal: no clubbing / cyanosis. No joint deformity upper and lower extremities.   Skin: no significant rashes, lesions, ulcers. Warm, dry, well-perfused. Neurologic: CN 2-12 grossly intact. Sensation  intact. Moving all extremities.  Psychiatric: Alert and oriented to person, place, and situation but no month or year. Pleasant and cooperative.    Labs and Imaging on Admission: I have personally reviewed following labs and imaging studies  CBC: Recent Labs  Lab 05/12/20 2130 05/13/20 0248 05/13/20 0257  WBC 10.5  --  9.5  HGB 10.8* 9.9* 10.9*  HCT 33.3* 29.0* 32.3*  MCV 95.1  --  95.0  PLT 239  --  213   Basic Metabolic Panel: Recent Labs  Lab 05/12/20 2130 05/12/20 2328 05/13/20 0248 05/13/20 0257  NA 140  --  141 139  K 2.9*  --  3.4* 3.5  CL 105  --   --  105  CO2 19*  --   --  19*  GLUCOSE 227*  --   --  168*  BUN 15  --   --  13  CREATININE 1.10*  --   --  0.91  CALCIUM 8.8*  --   --  8.5*  MG  --  1.8  --   --    GFR: Estimated Creatinine Clearance: 36.2 mL/min (by C-G formula based on SCr of 0.91 mg/dL). Liver Function Tests: Recent Labs  Lab 05/12/20 2203  AST 36  ALT 15  ALKPHOS 56  BILITOT 0.9  PROT 6.4*  ALBUMIN 3.8   Recent Labs  Lab 05/12/20 2203  LIPASE 25   No results for input(s): AMMONIA in the last 168 hours. Coagulation Profile: No results for input(s): INR, PROTIME in the last 168 hours. Cardiac Enzymes: No results for input(s): CKTOTAL, CKMB, CKMBINDEX, TROPONINI in the last 168 hours. BNP (last 3 results) No results for input(s): PROBNP in the last 8760 hours. HbA1C: Recent Labs    05/13/20 0257  HGBA1C 5.3   CBG: No results for input(s): GLUCAP in the last 168 hours. Lipid Profile: No results for input(s): CHOL, HDL, LDLCALC, TRIG, CHOLHDL, LDLDIRECT in the last 72 hours. Thyroid Function Tests: No results for input(s): TSH, T4TOTAL, FREET4, T3FREE, THYROIDAB in the last 72 hours. Anemia Panel: No results for input(s): VITAMINB12, FOLATE, FERRITIN, TIBC, IRON, RETICCTPCT in the last 72 hours. Urine analysis:    Component Value Date/Time   COLORURINE YELLOW 05/13/2020 0016   APPEARANCEUR CLOUDY (A) 05/13/2020 0016    LABSPEC 1.012 05/13/2020 0016   PHURINE 8.0 05/13/2020 0016   GLUCOSEU 50 (A) 05/13/2020 0016   HGBUR NEGATIVE 05/13/2020 0016   BILIRUBINUR NEGATIVE 05/13/2020 0016   KETONESUR 5 (A) 05/13/2020 0016   PROTEINUR 30 (A) 05/13/2020 0016   NITRITE NEGATIVE 05/13/2020 0016   LEUKOCYTESUR SMALL (A) 05/13/2020 0016   Sepsis Labs: @LABRCNTIP (procalcitonin:4,lacticidven:4) ) Recent Results (from the past 240 hour(s))  Resp Panel by RT-PCR (Flu A&B, Covid) Nasopharyngeal Swab     Status: None   Collection Time: 05/12/20 10:23 PM   Specimen: Nasopharyngeal Swab; Nasopharyngeal(NP) swabs in vial transport medium  Result Value Ref Range Status   SARS Coronavirus 2 by RT PCR NEGATIVE NEGATIVE Final    Comment: (NOTE) SARS-CoV-2 target nucleic acids are NOT DETECTED.  The SARS-CoV-2 RNA is generally detectable in upper respiratory specimens during the acute phase of infection. The lowest concentration of SARS-CoV-2 viral copies this assay can detect is 138 copies/mL. A negative result does not preclude SARS-Cov-2 infection and should not be used as the sole basis for treatment or other patient management decisions. A negative result may occur with  improper specimen collection/handling, submission of specimen other than nasopharyngeal swab, presence of viral mutation(s) within the areas targeted by this assay, and inadequate number of viral copies(<138 copies/mL). A negative result must be combined with clinical observations, patient history, and epidemiological information. The expected result is Negative.  Fact Sheet for Patients:  BloggerCourse.comhttps://www.fda.gov/media/152166/download  Fact Sheet for Healthcare Providers:  SeriousBroker.ithttps://www.fda.gov/media/152162/download  This test is no t yet approved or cleared by the [place] FDA and  has been authorized for detection and/or diagnosis of SARS-CoV-2 by FDA under an Emergency Use Authorization (EUA). This EUA will remain  in effect (meaning  this test can be used) for the duration of the COVID-19 declaration under Section 564(b)(1) of the Act, 21 U.S.C.section 360bbb-3(b)(1), unless the authorization is terminated  or revoked sooner.       Influenza A by PCR NEGATIVE NEGATIVE Final   Influenza B by PCR NEGATIVE NEGATIVE Final  Comment: (NOTE) The Xpert Xpress SARS-CoV-2/FLU/RSV plus assay is intended as an aid in the diagnosis of influenza from Nasopharyngeal swab specimens and should not be used as a sole basis for treatment. Nasal washings and aspirates are unacceptable for Xpert Xpress SARS-CoV-2/FLU/RSV testing.  Fact Sheet for Patients: BloggerCourse.com  Fact Sheet for Healthcare Providers: SeriousBroker.it  This test is not yet approved or cleared by the [place] FDA and has been authorized for detection and/or diagnosis of SARS-CoV-2 by FDA under an Emergency Use Authorization (EUA). This EUA will remain in effect (meaning this test can be used) for the duration of the COVID-19 declaration under Section 564(b)(1) of the Act, 21 U.S.C. section 360bbb-3(b)(1), unless the authorization is terminated or revoked.  Performed at [facility], [address] [place], [place] [address]      Radiological Exams on Admission: DG Chest 2 View  Result Date: 05/12/2020 CLINICAL DATA:  Substernal chest pain. EXAM: CHEST - 2 VIEW COMPARISON:  May 01, 2017 FINDINGS: Multiple sternal wires are noted. The lungs are mildly hyperinflated. There is no evidence of acute infiltrate, pleural effusion or pneumothorax. The cardiac silhouette is mildly enlarged. There is moderate severity calcification of the thoracic aorta. Degenerative changes seen throughout the thoracic spine. IMPRESSION: Stable exam without active cardiopulmonary disease. Electronically Signed   By: Aram Candela M.D.   On: 05/12/2020 22:17    EKG: Independently reviewed. Sinus tachycardia,  rate 111, 1st degree AV block, inferior ST-depressions.   Assessment/Plan   1. Non-STEMI  - Patient with hx of CAD s/p CABG in 1998 presents with chest pain and N/V and is found to have new ST-depressions on EKG and elevated troponin to 31 then 169  - Cardiology recommended continuing ACE-i, increasing statin intensity, ASA, starting IV heparin in troponin continued to rise, and adding beta-blocker and long-acting nitrate if tolerated  - Pain and ST changes have nearly resolved but troponin has increased further to 532  - Start IV heparin, continue lisinopril, ASA, and high-intensity statin, start metoprolol, continue cardiac monitoring, trend troponin    2. Hypertension  - SBP 147-189 in ED  - Continue lisinopril and Norvasc, add beta-blocker as tolerated    3. Hypokalemia  - Serum potassium 2.9 on admission in setting of N/V  - Replaced in ED, repeat chem panel   4. CKD IIIa  - SCr is 1.10 on admission, similar to prior  - Renally-dose medications, monitor    5. Hyperglycemia  - Serum glucose 229 in ED without hx of DM  - Check A1c, check CBGs and use low-intensity SSI if needed   6. Dementia  - Patient calm and cooperative in ED  - Delirium precautions, supportive care    DVT prophylaxis: IV heparin   Code Status: DNR  Level of Care: Level of care: Telemetry Cardiac Family Communication: Daughter updated by phone  Disposition Plan:  Patient is from: home  Anticipated d/c is to: TBD  Anticipated d/c date is: 05/16/20 Patient currently: Being started on IV heparin, pending further cardiology recommendations   Consults called: Cardiology  Admission status: Inpatient    Briscoe Deutscher, MD [facility]  05/13/2020, 4:00 AM

## 2020-05-13 NOTE — ED Notes (Signed)
Patient transported to CT 

## 2020-05-13 NOTE — Progress Notes (Signed)
ANTICOAGULATION CONSULT NOTE - Initial Consult  Pharmacy Consult for Heparin Indication: chest pain/ACS  Allergies  Allergen Reactions  . Aspirin Nausea And Vomiting    Upset stomach  . Codeine Nausea And Vomiting    Upset stomach    Patient Measurements: Height: 5\' 4"  (162.6 cm) Weight: 51.7 kg (113 lb 15.7 oz) IBW/kg (Calculated) : 54.7 Heparin Dosing Weight: 51.7  Vital Signs: BP: 150/62 (02/21 1200) Pulse Rate: 63 (02/21 1200)  Labs: Recent Labs    05/12/20 2130 05/12/20 2328 05/13/20 0248 05/13/20 0257 05/13/20 0520  HGB 10.8*  --  9.9* 10.9*  --   HCT 33.3*  --  29.0* 32.3*  --   PLT 239  --   --  213  --   CREATININE 1.10*  --   --  0.91  --   TROPONINIHS 31* 169*  --  532* 789*    Estimated Creatinine Clearance: 36.2 mL/min (by C-G formula based on SCr of 0.91 mg/dL).   Medical History: Past Medical History:  Diagnosis Date  . Baker's cyst, ruptured   . CAD (coronary artery disease)   . Dementia (HCC)   . History of hepatitis   . Hyperlipidemia   . Hypertension   . Lumbar degenerative disc disease   . Nephrolithiasis   . Spasmodic torticollis     Medications:  See electronic med rec  Assessment: 87 y.o. F presents with N/V/abd pain. Trop 169>>789. To begin heparin for ACS. Plan for 48 hours per cards. No AC PTA. CBC stable.  8 hour HL therapeutic at 0.40.  No bleeding noted.  Goal of Therapy:  Heparin level 0.3-0.7 units/ml Monitor platelets by anticoagulation protocol: Yes   Plan:  Continue heparin at 650 units/hr Check confirmatory HL in 8 hours Daily HL and CBC Monitor s/sx bleeding  88, PharmD PGY-1 Acute Care Pharmacy Resident Office: [phone] 05/13/2020 12:47 PM

## 2020-05-13 NOTE — ED Notes (Signed)
Patient's morning meds delayed. Patient offered a sip of apple juice and 2 spoonfuls of jello. Patient reported that she felt a return of nausea after, so will hold meds and reassess.

## 2020-05-13 NOTE — Progress Notes (Signed)
ANTICOAGULATION CONSULT NOTE - Follow Up Consult  Pharmacy Consult for IV Heparin Indication: chest pain/ACS  Allergies  Allergen Reactions  . Aspirin Nausea And Vomiting    Upset stomach  . Codeine Nausea And Vomiting    Upset stomach    Patient Measurements: Height: 5\' 4"  (162.6 cm) Weight: 52.9 kg (116 lb 10 oz) IBW/kg (Calculated) : 54.7 Heparin Dosing Weight: 51.7 kg  Vital Signs: Temp: 97.7 F (36.5 C) (02/21 2018) Temp Source: Oral (02/21 2018) BP: 155/79 (02/21 2018) Pulse Rate: 65 (02/21 2018)  Labs: Recent Labs    05/12/20 2130 05/12/20 2328 05/13/20 0248 05/13/20 0257 05/13/20 0520 05/13/20 1159 05/13/20 2118  HGB 10.8*  --  9.9* 10.9*  --   --   --   HCT 33.3*  --  29.0* 32.3*  --   --   --   PLT 239  --   --  213  --   --   --   HEPARINUNFRC  --   --   --   --   --  0.40 0.29*  CREATININE 1.10*  --   --  0.91  --   --   --   TROPONINIHS 31* 169*  --  532* 789*  --   --     Estimated Creatinine Clearance: 37.1 mL/min (by C-G formula based on SCr of 0.91 mg/dL).   Medical History: Past Medical History:  Diagnosis Date  . Baker's cyst, ruptured   . CAD (coronary artery disease)   . Dementia (HCC)   . History of hepatitis   . Hyperlipidemia   . Hypertension   . Lumbar degenerative disc disease   . Nephrolithiasis   . Spasmodic torticollis     Assessment: 87 yr old female presented with N/V/abd pain. Trop 169>>789. Pharmacy was consulted for heparin for ACS (pt was on no anticoagulation PTA). Per Cardiology, will manage NSTEMI medically unless pt develops refractory chest pain; planning for 48 hrs of IV heparin.   Confirmatory heparin level drawn ~9 hrs after initial therapeutic heparin level earlier today on heparin infusion at 650 units/hr was 0.29 units/ml, which is just below the goal range for this pt. H/H 10.9/32.3, plt 213 (CBC stable). Per RN, no issues with IV or bleeding observed.  Goal of Therapy:  Heparin level 0.3-0.7  units/ml Monitor platelets by anticoagulation protocol: Yes   Plan:  Increase heparin infusion to 700 units/hr Check heparin level in 8 hrs Monitor daily heparin level, CBC Monitor for bleeding  06-03-1991, PharmD, BCPS, [facility] Clinical Pharmacist 05/13/2020 9:51 PM

## 2020-05-13 NOTE — ED Notes (Signed)
Patient's daughter updated via phone at 20. Patient called out with nausea and bright green emesis @ 0735. Patient changed into gown and cleaned up. Assisted to hold the emesis bag and PRN medication given. Patient remains alert, tremerous and reports her stomach hurts. Not diaphoretic and no SOB noted.  Will message provider.

## 2020-05-13 NOTE — Progress Notes (Signed)
Cardiology attending addendum:  Gloria Rowland is an 67F with CAD s/p CABG, dementia, hypertension, and hyperlipidemia admitted with nausea, vomiting, chest and back pain.  Cardiology consulted for NSTEMI.  EKG with mild anterolateral depressions.  HS-troponin has increased from 31-->169-->532-->789.  She is currently chest pain free and complains of only joint pain.  She recalls having chest pain earlier but is unable to describe it.  She is no longer nauseous.  She has no leukocytosis, u/a mildly + for UTI.  Lactate and CTA of the abdomen are pending for assessment of ischemic colitis.  I reveiwed her echocardiogram.  She has mild septal hypokinesis but otherwise no major wall motion abnormalities.  Dr. Darnelle Catalan spoke with the family and don't want aggressive care.  Will manage NSTEMI medically unless she develops refractory chest pain.  Plan for 48 hours of heparin.  Continue aspirin, amlodipine, metoprolol, lisinopril, rosuvastatin.  As long as there are no surgical findings on her CT-A, recommend starting clopidogrel 75mg  daily.  Will add Imdur 30mg  daily.   We will continue to follow.    Gloria Manske C. , MD, [facility]  05/13/2020 1:49 PM

## 2020-05-13 NOTE — ED Notes (Signed)
Chest pain under the left arm, tremors, and vomiting. MD aware. Patient given medications as ordered.

## 2020-05-13 NOTE — Progress Notes (Signed)
  Echocardiogram 2D Echocardiogram with 3D has been performed.  Gloria Rowland 05/13/2020, 1:05 PM

## 2020-05-13 NOTE — ED Notes (Signed)
Cards to see Breakfast Orders Placed

## 2020-05-14 ENCOUNTER — Other Ambulatory Visit: Payer: Self-pay

## 2020-05-14 DIAGNOSIS — E7800 Pure hypercholesterolemia, unspecified: Secondary | ICD-10-CM

## 2020-05-14 DIAGNOSIS — I5041 Acute combined systolic (congestive) and diastolic (congestive) heart failure: Secondary | ICD-10-CM

## 2020-05-14 LAB — BASIC METABOLIC PANEL
Anion gap: 7 (ref 5–15)
BUN: 9 mg/dL (ref 8–23)
CO2: 24 mmol/L (ref 22–32)
Calcium: 8.3 mg/dL — ABNORMAL LOW (ref 8.9–10.3)
Chloride: 111 mmol/L (ref 98–111)
Creatinine, Ser: 1.02 mg/dL — ABNORMAL HIGH (ref 0.44–1.00)
GFR, Estimated: 54 mL/min — ABNORMAL LOW (ref >60)
Glucose, Bld: 105 mg/dL — ABNORMAL HIGH (ref 70–99)
Potassium: 3.8 mmol/L (ref 3.5–5.1)
Sodium: 142 mmol/L (ref 135–145)

## 2020-05-14 LAB — GLUCOSE, CAPILLARY
Glucose-Capillary: 101 mg/dL — ABNORMAL HIGH (ref 70–99)
Glucose-Capillary: 106 mg/dL — ABNORMAL HIGH (ref 70–99)
Glucose-Capillary: 110 mg/dL — ABNORMAL HIGH (ref 70–99)
Glucose-Capillary: 126 mg/dL — ABNORMAL HIGH (ref 70–99)
Glucose-Capillary: 150 mg/dL — ABNORMAL HIGH (ref 70–99)
Glucose-Capillary: 92 mg/dL (ref 70–99)

## 2020-05-14 LAB — CBC
HCT: 28.4 % — ABNORMAL LOW (ref 36.0–46.0)
Hemoglobin: 9.5 g/dL — ABNORMAL LOW (ref 12.0–15.0)
MCH: 31.3 pg (ref 26.0–34.0)
MCHC: 33.5 g/dL (ref 30.0–36.0)
MCV: 93.4 fL (ref 80.0–100.0)
Platelets: 189 10*3/uL (ref 150–400)
RBC: 3.04 MIL/uL — ABNORMAL LOW (ref 3.87–5.11)
RDW: 13.6 % (ref 11.5–15.5)
WBC: 6 10*3/uL (ref 4.0–10.5)
nRBC: 0 % (ref 0.0–0.2)

## 2020-05-14 LAB — HEPARIN LEVEL (UNFRACTIONATED)
Heparin Unfractionated: 0.3 IU/mL (ref 0.30–0.70)
Heparin Unfractionated: 0.24 IU/mL — ABNORMAL LOW (ref 0.30–0.70)
Heparin Unfractionated: 0.55 IU/mL (ref 0.30–0.70)

## 2020-05-14 LAB — LIPASE, BLOOD: Lipase: 23 U/L (ref 11–51)

## 2020-05-14 MED ORDER — POLYETHYLENE GLYCOL 3350 17 GM/SCOOP PO POWD
17.0000 g | Freq: Every day | ORAL | Status: DC
  Filled 2020-05-14: qty 255

## 2020-05-14 MED ORDER — ALUM & MAG HYDROXIDE-SIMETH 200-200-20 MG/5ML PO SUSP
30.0000 mL | ORAL | Status: DC | PRN
Start: 2020-05-14 — End: 2020-05-16
  Administered 2020-05-14: 30 mL via ORAL
  Filled 2020-05-14: qty 30

## 2020-05-14 MED ORDER — CITALOPRAM HYDROBROMIDE 20 MG PO TABS
40.0000 mg | ORAL_TABLET | Freq: Every day | ORAL | Status: DC
  Administered 2020-05-14 – 2020-05-16 (×3): 40 mg via ORAL
  Filled 2020-05-14 (×3): qty 2

## 2020-05-14 MED ORDER — MEMANTINE HCL 10 MG PO TABS
10.0000 mg | ORAL_TABLET | Freq: Two times a day (BID) | ORAL | Status: DC
  Administered 2020-05-14 – 2020-05-16 (×5): 10 mg via ORAL
  Filled 2020-05-14 (×8): qty 1

## 2020-05-14 MED ORDER — POLYETHYLENE GLYCOL 3350 17 G PO PACK
17.0000 g | PACK | Freq: Every day | ORAL | Status: DC
  Administered 2020-05-15 – 2020-05-16 (×2): 17 g via ORAL
  Filled 2020-05-14 (×2): qty 1

## 2020-05-14 MED ORDER — GEMFIBROZIL 600 MG PO TABS
300.0000 mg | ORAL_TABLET | Freq: Two times a day (BID) | ORAL | Status: DC
  Administered 2020-05-14 – 2020-05-16 (×5): 300 mg via ORAL
  Filled 2020-05-14 (×6): qty 0.5

## 2020-05-14 MED ORDER — AMLODIPINE BESYLATE 10 MG PO TABS
10.0000 mg | ORAL_TABLET | Freq: Every day | ORAL | Status: DC
  Administered 2020-05-15 – 2020-05-16 (×2): 10 mg via ORAL
  Filled 2020-05-14 (×3): qty 1

## 2020-05-14 MED ORDER — AMLODIPINE BESYLATE 5 MG PO TABS
5.0000 mg | ORAL_TABLET | Freq: Once | ORAL | Status: AC
  Administered 2020-05-14: 5 mg via ORAL
  Filled 2020-05-14: qty 1

## 2020-05-14 MED ORDER — CLOPIDOGREL BISULFATE 75 MG PO TABS
75.0000 mg | ORAL_TABLET | Freq: Every day | ORAL | Status: DC
  Administered 2020-05-14 – 2020-05-16 (×3): 75 mg via ORAL
  Filled 2020-05-14 (×3): qty 1

## 2020-05-14 NOTE — Progress Notes (Signed)
Noted with hand tremors bilaterally. spoon fed during dinner.

## 2020-05-14 NOTE — Progress Notes (Signed)
ANTICOAGULATION CONSULT NOTE - Follow Up Consult  Pharmacy Consult for heparin Indication: NSTEMI  Labs: Recent Labs    05/12/20 2130 05/12/20 2328 05/13/20 0248 05/13/20 0257 05/13/20 0520 05/13/20 1159 05/13/20 2118 05/14/20 0549  HGB 10.8*  --  9.9* 10.9*  --   --   --  9.5*  HCT 33.3*  --  29.0* 32.3*  --   --   --  28.4*  PLT 239  --   --  213  --   --   --  189  HEPARINUNFRC  --   --   --   --   --  0.40 0.29* 0.30  CREATININE 1.10*  --   --  0.91  --   --   --   --   TROPONINIHS 31* 169*  --  532* 789*  --   --   --     Assessment/Plan:  87yo female therapeutic on heparin after rate change. Will continue gtt at current rate of 700 units/hr and confirm stable with additional level.   Vernard Gambles, PharmD, BCPS  05/14/2020,6:52 AM

## 2020-05-14 NOTE — Progress Notes (Signed)
ANTICOAGULATION CONSULT NOTE - Follow Up Consult  Pharmacy Consult for IV Heparin Indication: chest pain/ACS  Allergies  Allergen Reactions  . Aspirin Nausea And Vomiting    Upset stomach  . Codeine Nausea And Vomiting    Upset stomach    Patient Measurements: Height: 5\' 4"  (162.6 cm) Weight: 52.9 kg (116 lb 10 oz) IBW/kg (Calculated) : 54.7 Heparin Dosing Weight: 51.7 kg  Vital Signs: Temp: 98.8 F (37.1 C) (02/22 1119) Temp Source: Oral (02/22 1119) BP: 126/69 (02/22 1119) Pulse Rate: 55 (02/22 1119)  Labs: Recent Labs    05/12/20 2130 05/12/20 2328 05/13/20 0248 05/13/20 0257 05/13/20 0520 05/13/20 1159 05/13/20 2118 05/14/20 0549 05/14/20 1202  HGB 10.8*  --  9.9* 10.9*  --   --   --  9.5*  --   HCT 33.3*  --  29.0* 32.3*  --   --   --  28.4*  --   PLT 239  --   --  213  --   --   --  189  --   HEPARINUNFRC  --   --   --   --   --    < > 0.29* 0.30 0.24*  CREATININE 1.10*  --   --  0.91  --   --   --  1.02*  --   TROPONINIHS 31* 169*  --  532* 789*  --   --   --   --    < > = values in this interval not displayed.    Estimated Creatinine Clearance: 33.1 mL/min (A) (by C-G formula based on SCr of 1.02 mg/dL (H)).   Medical History: Past Medical History:  Diagnosis Date  . Baker's cyst, ruptured   . CAD (coronary artery disease)   . Dementia (HCC)   . History of hepatitis   . Hyperlipidemia   . Hypertension   . Lumbar degenerative disc disease   . Nephrolithiasis   . Spasmodic torticollis     Assessment: 87 yr old female presented with N/V/abd pain. Trop 169>>789. Pharmacy was consulted for heparin for ACS (pt was on no anticoagulation PTA). Per Cardiology, will manage NSTEMI medically unless pt develops refractory chest pain; planning for 48 hrs of IV heparin.   Confirmatory heparin level this afternoon is low again at 0.24. No bleeding or infusion issues noted.   Goal of Therapy:  Heparin level 0.3-0.7 units/ml Monitor platelets by  anticoagulation protocol: Yes   Plan:  Increase heparin infusion to 800 units/hr Check heparin level in 8 hrs Monitor daily heparin level, CBC Monitor for bleeding  88 PharmD., BCPS Clinical Pharmacist 05/14/2020 12:41 PM

## 2020-05-14 NOTE — Progress Notes (Signed)
PROGRESS NOTE                                                                                                                                                                                                             Patient Demographics:    Gloria Rowland, is a 87 y.o. female, DOB - 1932/05/06, ZOX:[id number]  Outpatient Primary MD for the patient is Ignatius Specking, MD    LOS - 1  Admit date - 05/12/2020    Chief Complaint  Patient presents with  . Chest Pain       Brief Narrative (HPI from H&P)  Gloria Rowland is a 87 y.o. female with medical history significant for CAD status post CABG in 1998, hypertension, pancreatic head cyst, hyperlipidemia, and dementia, now presenting to the emergency department for evaluation of chest pain, nausea, and vomiting, work-up suggested NSTEMI and she was admitted to the hospital for further work-up.   Subjective:    Gloria Rowland today has, No headache, No chest pain, No abdominal pain - No Nausea, No new weakness tingling or numbness, no SOB.   Assessment  & Plan :      1. NSTEMI with Chest pain - seen by cardiology, currently on aspirin, Plavix, beta-blocker, statin, heparin drip and Imdur for secondary prevention.  She is not a candidate for invasive testing or procedures, EF is 45% and somewhat depressed.  Currently chest pain-free.  Cardiology on board.  Defer further management to cardiology but I would think she is appropriate for medical management and if worse focus on palliative care and comfort.  Plan also discussed with daughter bedside who agrees.  Will involve palliative care as well.  2.  HX of pancreatic head cyst.  Slightly grown in size.  Outpatient MRCP if desired.  Not a candidate for much work-up as management will not change at her age.  Will order baseline lipase for reference.  Currently abdominal pain-free.  3.  CKD 3A.  Creatinine appears to be at  baseline.  4.  Dementia.  Home medications will be resumed.  5.  Hypertension.  On combination of beta-blocker, Norvasc, Imdur and lisinopril.  Monitor  6.  GERD.  PPI.  7.  Frail status, advanced age and deconditioning.  Daughter wants to take her home with home health and palliative care, she does not want  any heroic measures.  Palliative care will be consulted       Condition - Extremely Guarded  Family Communication  :  Daughter bedside 05/14/20  Code Status :  DNR  Consults  :  Cards, Pall Care  PUD Prophylaxis : PPI   Procedures  :     TTE -  1. LVEF Is depressed severe hypokinesis of the mid/distal inferior, inferoseptal walls. . Left ventricular ejection fraction, by estimation, is 45 to 50%. The left ventricle has mildly decreased function. There is mild left ventricular hypertrophy. Left  ventricular diastolic parameters are consistent with Grade I diastolic dysfunction (impaired relaxation).  2. Right ventricular systolic function is normal. The right ventricular size is normal. There is normal pulmonary artery systolic pressure.  3. The mitral valve is abnormal. Trivial mitral valve regurgitation.  4. The aortic valve is abnormal. Aortic valve regurgitation is mild. Mild aortic valve sclerosis is present, with no evidence of aortic valve stenosis.  5. The inferior vena cava is normal in size with greater than 50% respiratory variability, suggesting right atrial pressure of 3 mmHg.  CT - 1. No significant proximal mesenteric arterial occlusive disease to suggest etiology of abdominal pain. 2. 2.6 cm cystic pancreatic head lesion, previously 1.8 cm. Consider elective outpatient MR/MRCP for further characterization. 3. Chronic left UPJ obstruction. 4. Trace left pleural effusion      Disposition Plan  :    Status is: Inpatient  Remains inpatient appropriate because:IV treatments appropriate due to intensity of illness or inability to take PO   Dispo: The patient is from:  Home              Anticipated d/c is to: Home              Anticipated d/c date is: 3 days              Patient currently is not medically stable to d/c.   Difficult to place patient No   DVT Prophylaxis  :  Heparin gtt  Lab Results  Component Value Date   PLT 189 05/14/2020    Diet :  Diet Order            Diet clear liquid Room service appropriate? Yes; Fluid consistency: Thin  Diet effective now                  Inpatient Medications  Scheduled Meds: . [START ON 05/15/2020] amLODipine  10 mg Oral Daily  . aspirin EC  81 mg Oral Daily  . clopidogrel  75 mg Oral Daily  . insulin aspart  0-6 Units Subcutaneous Q4H  . isosorbide mononitrate  30 mg Oral Daily  . lisinopril  20 mg Oral Daily  . metoprolol succinate  25 mg Oral Daily  . pantoprazole  40 mg Oral Daily  . rosuvastatin  20 mg Oral Daily   Continuous Infusions: . heparin 700 Units/hr (05/14/20 0900)   PRN Meds:.acetaminophen, nitroGLYCERIN, ondansetron (ZOFRAN) IV, promethazine  Antibiotics  :    Anti-infectives (From admission, onward)   None       Time Spent in minutes  30   Susa Raring M.D on 05/14/2020 at 12:02 PM  To page go to [email or web address]   [facility] -  Office  [phone]     See all Orders from today for further details    Objective:   Vitals:   05/14/20 0337 05/14/20 0743 05/14/20 0851 05/14/20 1119  BP: (!) 141/68 (!) 152/84 (!) 147/66  126/69  Pulse: (!) 57 62  (!) 55  Resp: Temp: 98.3 F (36.8 C) 98.2 F (36.8 C)  98.8 F (37.1 C)  TempSrc: Oral Oral  Oral  SpO2: 100% 97%  97%  Weight:      Height:        Wt Readings from Last 3 Encounters:  05/13/20 52.9 kg  04/10/18 51.7 kg  12/07/17 63.5 kg     Intake/Output Summary (Last 24 hours) at 05/14/2020 1202 Last data filed at 05/14/2020 1100 Gross per 24 hour  Intake 1315.28 ml  Output 450 ml  Net 865.28 ml     Physical Exam  Awake mildly confused, in no distress, no focal  deficits [place].AT,PERRAL Supple Neck,No JVD, No cervical lymphadenopathy appriciated.  Symmetrical Chest wall movement, Good air movement bilaterally, CTAB RRR,No Gallops,Rubs or new Murmurs, No Parasternal Heave +ve B.Sounds, Abd Soft, No tenderness, No organomegaly appriciated, No rebound - guarding or rigidity. No Cyanosis, Clubbing or edema, No new Rash or bruise       Data Review:    CBC Recent Labs  Lab 05/12/20 2130 05/13/20 0248 05/13/20 0257 05/14/20 0549  WBC 10.5  --  9.5 6.0  HGB 10.8* 9.9* 10.9* 9.5*  HCT 33.3* 29.0* 32.3* 28.4*  PLT 239  --  213 189  MCV 95.1  --  95.0 93.4  MCH 30.9  --  32.1 31.3  MCHC 32.4  --  33.7 33.5  RDW 13.2  --  13.2 13.6    Recent Labs  Lab 05/12/20 2130 05/12/20 2203 05/12/20 2328 05/13/20 0248 05/13/20 0257 05/13/20 1311 05/14/20 0549  NA 140  --   --  141 139  --  142  K 2.9*  --   --  3.4* 3.5  --  3.8  CL 105  --   --   --  105  --  111  CO2 19*  --   --   --  19*  --  24  GLUCOSE 227*  --   --   --  168*  --  105*  BUN 15  --   --   --  13  --  9  CREATININE 1.10*  --   --   --  0.91  --  1.02*  CALCIUM 8.8*  --   --   --  8.5*  --  8.3*  AST  --  36  --   --   --   --   --   ALT  --  15  --   --   --   --   --   ALKPHOS  --  56  --   --   --   --   --   BILITOT  --  0.9  --   --   --   --   --   ALBUMIN  --  3.8  --   --   --   --   --   MG  --   --  1.8  --   --   --   --   LATICACIDVEN  --   --   --   --   --  0.8  --   HGBA1C  --   --   --   --  5.3  --   --     ------------------------------------------------------------------------------------------------------------------ No results for input(s): CHOL, HDL, LDLCALC, TRIG, CHOLHDL, LDLDIRECT in the last 72  hours.  Lab Results  Component Value Date   HGBA1C 5.3 05/13/2020   ------------------------------------------------------------------------------------------------------------------ No results for input(s): TSH, T4TOTAL, T3FREE, THYROIDAB in the last  72 hours.  Invalid input(s): FREET3  Cardiac Enzymes No results for input(s): CKMB, TROPONINI, MYOGLOBIN in the last 168 hours.  Invalid input(s): CK ------------------------------------------------------------------------------------------------------------------ No results found for: BNP  Micro Results Recent Results (from the past 240 hour(s))  Resp Panel by RT-PCR (Flu A&B, Covid) Nasopharyngeal Swab     Status: None   Collection Time: 05/12/20 10:23 PM   Specimen: Nasopharyngeal Swab; Nasopharyngeal(NP) swabs in vial transport medium  Result Value Ref Range Status   SARS Coronavirus 2 by RT PCR NEGATIVE NEGATIVE Final    Comment: (NOTE) SARS-CoV-2 target nucleic acids are NOT DETECTED.  The SARS-CoV-2 RNA is generally detectable in upper respiratory specimens during the acute phase of infection. The lowest concentration of SARS-CoV-2 viral copies this assay can detect is 138 copies/mL. A negative result does not preclude SARS-Cov-2 infection and should not be used as the sole basis for treatment or other patient management decisions. A negative result may occur with  improper specimen collection/handling, submission of specimen other than nasopharyngeal swab, presence of viral mutation(s) within the areas targeted by this assay, and inadequate number of viral copies(<138 copies/mL). A negative result must be combined with clinical observations, patient history, and epidemiological information. The expected result is Negative.  Fact Sheet for Patients:  BloggerCourse.com  Fact Sheet for Healthcare Providers:  SeriousBroker.it  This test is no t yet approved or cleared by the [place] FDA and  has been authorized for detection and/or diagnosis of SARS-CoV-2 by FDA under an Emergency Use Authorization (EUA). This EUA will remain  in effect (meaning this test can be used) for the duration of the COVID-19 declaration  under Section 564(b)(1) of the Act, 21 U.S.C.section 360bbb-3(b)(1), unless the authorization is terminated  or revoked sooner.       Influenza A by PCR NEGATIVE NEGATIVE Final   Influenza B by PCR NEGATIVE NEGATIVE Final    Comment: (NOTE) The Xpert Xpress SARS-CoV-2/FLU/RSV plus assay is intended as an aid in the diagnosis of influenza from Nasopharyngeal swab specimens and should not be used as a sole basis for treatment. Nasal washings and aspirates are unacceptable for Xpert Xpress SARS-CoV-2/FLU/RSV testing.  Fact Sheet for Patients: BloggerCourse.com  Fact Sheet for Healthcare Providers: SeriousBroker.it  This test is not yet approved or cleared by the [place] FDA and has been authorized for detection and/or diagnosis of SARS-CoV-2 by FDA under an Emergency Use Authorization (EUA). This EUA will remain in effect (meaning this test can be used) for the duration of the COVID-19 declaration under Section 564(b)(1) of the Act, 21 U.S.C. section 360bbb-3(b)(1), unless the authorization is terminated or revoked.  Performed at [facility], [address] [place], [place] [address]   MRSA PCR Screening     Status: None   Collection Time: 05/13/20  8:22 PM   Specimen: Nasopharyngeal  Result Value Ref Range Status   MRSA by PCR NEGATIVE NEGATIVE Final    Comment:        The GeneXpert MRSA Assay (FDA approved for NASAL specimens only), is one component of a comprehensive MRSA colonization surveillance program. It is not intended to diagnose MRSA infection nor to guide or monitor treatment for MRSA infections. Performed at [facility], [address], [place], [place] [address]     Radiology Reports DG Chest 2  View  Result Date: 05/12/2020 CLINICAL DATA:  Substernal chest pain. EXAM: CHEST - 2 VIEW COMPARISON:  May 01, 2017 FINDINGS: Multiple sternal wires are noted. The lungs are mildly  hyperinflated. There is no evidence of acute infiltrate, pleural effusion or pneumothorax. The cardiac silhouette is mildly enlarged. There is moderate severity calcification of the thoracic aorta. Degenerative changes seen throughout the thoracic spine. IMPRESSION: Stable exam without active cardiopulmonary disease. Electronically Signed   By: Aram Candela M.D.   On: 05/12/2020 22:17   ECHOCARDIOGRAM COMPLETE  Result Date: 05/13/2020    ECHOCARDIOGRAM REPORT   Patient Name:   Gloria Rowland Date of Exam: 05/13/2020 Medical Rec #:  [id number]        Height:       64.0 in Accession #:    [id number]       Weight:       114.0 lb Date of Birth:  1932-05-01        BSA:          1.541 m Patient Age:    88 years         BP:           128/79 mmHg Patient Gender: F                HR:           56 bpm. Exam Location:  Inpatient Procedure: Cardiac Doppler, Color Doppler and Strain Analysis STAT ECHO Indications:    Elevated Troponin  History:        Patient has no prior history of Echocardiogram examinations.                 CAD, Prior CABG; Risk Factors:Hypertension and Dyslipidemia.  Sonographer:    Leta Jungling RDCS Referring Phys: [phone] TIFFANY Woodland IMPRESSIONS  1. LVEF Is depressed severe hypokinesis of the mid/distal inferior, inferoseptal walls. . Left ventricular ejection fraction, by estimation, is 45 to 50%. The left ventricle has mildly decreased function. There is mild left ventricular hypertrophy. Left  ventricular diastolic parameters are consistent with Grade I diastolic dysfunction (impaired relaxation).  2. Right ventricular systolic function is normal. The right ventricular size is normal. There is normal pulmonary artery systolic pressure.  3. The mitral valve is abnormal. Trivial mitral valve regurgitation.  4. The aortic valve is abnormal. Aortic valve regurgitation is mild. Mild aortic valve sclerosis is present, with no evidence of aortic valve stenosis.  5. The inferior vena cava is  normal in size with greater than 50% respiratory variability, suggesting right atrial pressure of 3 mmHg. FINDINGS  Left Ventricle: LVEF Is depressed severe hypokinesis of the mid/distal inferior, inferoseptal walls. Left ventricular ejection fraction, by estimation, is 45 to 50%. The left ventricle has mildly decreased function. The left ventricular internal cavity size was normal in size. There is mild left ventricular hypertrophy. Left ventricular diastolic parameters are consistent with Grade I diastolic dysfunction (impaired relaxation). Right Ventricle: The right ventricular size is normal. No increase in right ventricular wall thickness. Right ventricular systolic function is normal. There is normal pulmonary artery systolic pressure. The tricuspid regurgitant velocity is 2.37 m/s, and  with an assumed right atrial pressure of 3 mmHg, the estimated right ventricular systolic pressure is 25.5 mmHg. Left Atrium: Left atrial size was normal in size. Right Atrium: Right atrial size was normal in size. Pericardium: There is no evidence of pericardial effusion. Mitral Valve: The mitral valve is abnormal. There is mild thickening of the mitral  valve leaflet(s). Mild to moderate mitral annular calcification. Trivial mitral valve regurgitation. Tricuspid Valve: The tricuspid valve is normal in structure. Tricuspid valve regurgitation is trivial. Aortic Valve: The aortic valve is abnormal. Aortic valve regurgitation is mild. Mild aortic valve sclerosis is present, with no evidence of aortic valve stenosis. Pulmonic Valve: The pulmonic valve was normal in structure. Pulmonic valve regurgitation is not visualized. Aorta: The aortic root is normal in size and structure. Venous: The inferior vena cava is normal in size with greater than 50% respiratory variability, suggesting right atrial pressure of 3 mmHg. IAS/Shunts: The interatrial septum was not assessed.  LEFT VENTRICLE PLAX 2D LVIDd:         4.10 cm  Diastology  LVIDs:         2.90 cm  LV e' medial:    4.80 cm/s LV PW:         1.20 cm  LV E/e' medial:  18.9 LV IVS:        1.50 cm  LV e' lateral:   5.25 cm/s LVOT diam:     1.80 cm  LV E/e' lateral: 17.3 LV SV:         51 LV SV Index:   33 LVOT Area:     2.54 cm                          3D Volume EF:                         3D EF:        55 %                         LV EDV:       110 ml                         LV ESV:       49 ml                         LV SV:        61 ml RIGHT VENTRICLE RV S prime:     10.40 cm/s TAPSE (M-mode): 2.1 cm LEFT ATRIUM             Index       RIGHT ATRIUM           Index LA diam:        3.80 cm 2.47 cm/m  RA Area:     16.40 cm LA Vol (A2C):   55.1 ml 35.77 ml/m RA Volume:   45.90 ml  29.80 ml/m LA Vol (A4C):   42.6 ml 27.65 ml/m LA Biplane Vol: 50.2 ml 32.59 ml/m  AORTIC VALVE LVOT Vmax:   80.00 cm/s LVOT Vmean:  62.100 cm/s LVOT VTI:    0.199 m  AORTA Ao Root diam: 3.10 cm Ao Asc diam:  3.30 cm MITRAL VALVE                TRICUSPID VALVE MV Area (PHT): 2.56 cm     TR Peak grad:   22.5 mmHg MV Decel Time: 296 msec     TR Vmax:        237.00 cm/s MV E velocity: 90.80 cm/s MV A velocity: 136.00 cm/s  SHUNTS MV E/A ratio:  0.67  Systemic VTI:  0.20 m                             Systemic Diam: 1.80 cm Dietrich Pates MD Electronically signed by Dietrich Pates MD Signature Date/Time: 05/13/2020/2:09:12 PM    Final    CT Angio Abd/Pel w/ and/or w/o  Result Date: 05/13/2020 CLINICAL DATA:  Epigastric pain, vomiting EXAM: CTA ABDOMEN AND PELVIS WITH CONTRAST TECHNIQUE: Multidetector CT imaging of the abdomen and pelvis was performed using the standard protocol during bolus administration of intravenous contrast. Multiplanar reconstructed images and MIPs were obtained and reviewed to evaluate the vascular anatomy. CONTRAST:  OMNIPAQUE IOHEXOL 350 MG/ML SOLN COMPARISON:  04/09/2018 FINDINGS: VASCULAR Aorta: Moderate calcified atheromatous plaque. No aneurysm, dissection, or stenosis.  Celiac: Partially calcified ostial plaque with short segment mild stenosis, patent distally. SMA: Partially calcified ostial plaque resulting in short segment mild stenosis, patent distally with classic branch anatomy. Renals: Both renal arteries are patent without evidence of aneurysm, dissection, vasculitis, fibromuscular dysplasia or significant stenosis. IMA: Patent without evidence of aneurysm, dissection, vasculitis or significant stenosis. Inflow: Scattered calcified plaque involving bilateral common and internal iliac arteries. No aneurysm, dissection, or stenosis. Proximal Outflow: Bilateral common femoral and visualized portions of the superficial and profunda femoral arteries are patent without evidence of aneurysm, dissection, vasculitis or significant stenosis. Veins: Patent patent veins, portal vein, renal veins. Iliac venous system and IVC unremarkable. No venous pathology identified. Review of the MIP images confirms the above findings. NON-VASCULAR Lower chest: Previous median sternotomy. Trace left pleural effusion. Minimal dependent atelectasis in the lung bases. Hepatobiliary: Cholecystectomy clips. Mild central intrahepatic biliary ductal dilatation and ectasias of the CBD as before. No new liver lesion. Pancreas: 2.6 cm cystic pancreatic head lesion, previously 1.8 cm. No ductal dilatation. Spleen: Normal in size without focal abnormality. Adrenals/Urinary Tract: Adrenal glands unremarkable. Chronic left UPJ obstruction. Ureters decompressed. Moderate distension of the urinary bladder. Stomach/Bowel: Stomach is nondistended. Small bowel decompressed. Appendix not discretely identified. The colon is nondilated with a few scattered sigmoid diverticula; no adjacent inflammatory change. Lymphatic: No abdominal or pelvic adenopathy. Reproductive: Status post hysterectomy. No adnexal masses. Other: No ascites.  No free air. Musculoskeletal: Thoracolumbar levoscoliosis apex L2 with multilevel lumbar  spondylitic change. No fracture or worrisome bone lesion. IMPRESSION: 1. No significant proximal mesenteric arterial occlusive disease to suggest etiology of abdominal pain. 2. 2.6 cm cystic pancreatic head lesion, previously 1.8 cm. Consider elective outpatient MR/MRCP for further characterization. 3. Chronic left UPJ obstruction. 4. Trace left pleural effusion. Aortic Atherosclerosis (ICD10-I70.0). Electronically Signed   By: Corlis Leak M.D.   On: 05/13/2020 15:58

## 2020-05-14 NOTE — Progress Notes (Signed)
Progress Note  Patient Name: Gloria Rowland Date of Encounter: 05/14/2020  [facility] Cardiologist: Chilton Siiffany [place], MD   Subjective   Denies chest pain currently.  She had sharp left-sided chest pain prior to admission that is different than the pain she had prior to her CABG.  She continues to have some abdominal discomfort.  Denies nausea or vomiting.  Inpatient Medications    Scheduled Meds:  amLODipine  5 mg Oral Daily   aspirin EC  81 mg Oral Daily   insulin aspart  0-6 Units Subcutaneous Q4H   isosorbide mononitrate  30 mg Oral Daily   lisinopril  20 mg Oral Daily   metoprolol succinate  25 mg Oral Daily   pantoprazole  40 mg Oral Daily   rosuvastatin  20 mg Oral Daily   Continuous Infusions:  heparin 700 Units/hr (05/14/20 0900)   PRN Meds: acetaminophen, nitroGLYCERIN, ondansetron (ZOFRAN) IV, promethazine   Vital Signs    Vitals:   05/14/20 0000 05/14/20 0337 05/14/20 0743 05/14/20 0851  BP: 140/66 (!) 141/68 (!) 152/84 (!) 147/66  Pulse: (!) 53 (!) 57 62   Resp: 19 16 16    Temp:  98.3 F (36.8 C) 98.2 F (36.8 C)   TempSrc:  Oral Oral   SpO2: 100% 100% 97%   Weight:      Height:        Intake/Output Summary (Last 24 hours) at 05/14/2020 1010 Last data filed at 05/14/2020 1000 Gross per 24 hour  Intake 1308.28 ml  Output 450 ml  Net 858.28 ml   Last 3 Weights 05/13/2020 05/12/2020 04/10/2018  Weight (lbs) 116 lb 10 oz 113 lb 15.7 oz 113 lb 15.7 oz  Weight (kg) 52.9 kg 51.7 kg 51.7 kg      Telemetry    Sinus rhythm.  PVCs- Personally Reviewed  ECG    Sinus rhythm.  Rate 62 bpm.  PACs.  - Personally Reviewed  Physical Exam   VS:  BP (!) 147/66    Pulse 62    Temp 98.2 F (36.8 C) (Oral)    Resp 16    Ht 5\' 4"  (1.626 m)    Wt 52.9 kg    SpO2 97%    BMI 20.02 kg/m  , BMI Body mass index is 20.02 kg/m. GENERAL:  Frail.  No acute distress.  HEENT: Pupils equal round and reactive, fundi not visualized, oral mucosa  unremarkable NECK:  No jugular venous distention, waveform within normal limits, carotid upstroke brisk and symmetric LUNGS:  Clear to auscultation bilaterally HEART:  RRR.  PMI not displaced or sustained,S1 and S2 within normal limits, no S3, no S4, no clicks, no rubs, no murmurs CHEST: +chest wall tenderness to palpation ABD:  Flat, positive bowel sounds normal in frequency in pitch, no bruits, no rebound, no guarding, no midline pulsatile mass, no hepatomegaly, no splenomegaly EXT:  2 plus pulses throughout, no edema, no cyanosis no clubbing SKIN:  No rashes no nodules NEURO:  Cranial nerves II through XII grossly intact, motor grossly intact throughout PSYCH:  Cognitively intact, oriented to person place and time   Labs    High Sensitivity Troponin:   Recent Labs  Lab 05/12/20 2130 05/12/20 2328 05/13/20 0257 05/13/20 0520  TROPONINIHS 31* 169* 532* 789*      Chemistry Recent Labs  Lab 05/12/20 2130 05/12/20 2203 05/13/20 0248 05/13/20 0257 05/14/20 0549  NA 140  --  141 139 142  K 2.9*  --  3.4* 3.5  3.8  CL 105  --   --  105 111  CO2 19*  --   --  19* 24  GLUCOSE 227*  --   --  168* 105*  BUN 15  --   --  13 9  CREATININE 1.10*  --   --  0.91 1.02*  CALCIUM 8.8*  --   --  8.5* 8.3*  PROT  --  6.4*  --   --   --   ALBUMIN  --  3.8  --   --   --   AST  --  36  --   --   --   ALT  --  15  --   --   --   ALKPHOS  --  56  --   --   --   BILITOT  --  0.9  --   --   --   GFRNONAA 49*  --   --  >60 54*  ANIONGAP 16*  --   --  15 7     Hematology Recent Labs  Lab 05/12/20 2130 05/13/20 0248 05/13/20 0257 05/14/20 0549  WBC 10.5  --  9.5 6.0  RBC 3.50*  --  3.40* 3.04*  HGB 10.8* 9.9* 10.9* 9.5*  HCT 33.3* 29.0* 32.3* 28.4*  MCV 95.1  --  95.0 93.4  MCH 30.9  --  32.1 31.3  MCHC 32.4  --  33.7 33.5  RDW 13.2  --  13.2 13.6  PLT 239  --  213 189    BNPNo results for input(s): BNP, PROBNP in the last 168 hours.   DDimer No results for input(s): DDIMER in  the last 168 hours.   Radiology    DG Chest 2 View  Result Date: 05/12/2020 CLINICAL DATA:  Substernal chest pain. EXAM: CHEST - 2 VIEW COMPARISON:  May 01, 2017 FINDINGS: Multiple sternal wires are noted. The lungs are mildly hyperinflated. There is no evidence of acute infiltrate, pleural effusion or pneumothorax. The cardiac silhouette is mildly enlarged. There is moderate severity calcification of the thoracic aorta. Degenerative changes seen throughout the thoracic spine. IMPRESSION: Stable exam without active cardiopulmonary disease. Electronically Signed   By: Aram Candela M.D.   On: 05/12/2020 22:17   ECHOCARDIOGRAM COMPLETE  Result Date: 05/13/2020    ECHOCARDIOGRAM REPORT   Patient Name:   Gloria Rowland Date of Exam: 05/13/2020 Medical Rec #:  [id number]        Height:       64.0 in Accession #:    [id number]       Weight:       114.0 lb Date of Birth:  01/31/1933        BSA:          1.541 m Patient Age:    88 years         BP:           128/79 mmHg Patient Gender: F                HR:           56 bpm. Exam Location:  Inpatient Procedure: Cardiac Doppler, Color Doppler and Strain Analysis STAT ECHO Indications:    Elevated Troponin  History:        Patient has no prior history of Echocardiogram examinations.                 CAD, Prior CABG; Risk Factors:Hypertension and Dyslipidemia.  Sonographer:  Leta Jungling RDCS Referring Phys: [id number] Kayia Billinger [employer] IMPRESSIONS  1. LVEF Is depressed severe hypokinesis of the mid/distal inferior, inferoseptal walls. . Left ventricular ejection fraction, by estimation, is 45 to 50%. The left ventricle has mildly decreased function. There is mild left ventricular hypertrophy. Left  ventricular diastolic parameters are consistent with Grade I diastolic dysfunction (impaired relaxation).  2. Right ventricular systolic function is normal. The right ventricular size is normal. There is normal pulmonary artery systolic pressure.  3. The mitral  valve is abnormal. Trivial mitral valve regurgitation.  4. The aortic valve is abnormal. Aortic valve regurgitation is mild. Mild aortic valve sclerosis is present, with no evidence of aortic valve stenosis.  5. The inferior vena cava is normal in size with greater than 50% respiratory variability, suggesting right atrial pressure of 3 mmHg. FINDINGS  Left Ventricle: LVEF Is depressed severe hypokinesis of the mid/distal inferior, inferoseptal walls. Left ventricular ejection fraction, by estimation, is 45 to 50%. The left ventricle has mildly decreased function. The left ventricular internal cavity size was normal in size. There is mild left ventricular hypertrophy. Left ventricular diastolic parameters are consistent with Grade I diastolic dysfunction (impaired relaxation). Right Ventricle: The right ventricular size is normal. No increase in right ventricular wall thickness. Right ventricular systolic function is normal. There is normal pulmonary artery systolic pressure. The tricuspid regurgitant velocity is 2.37 m/s, and  with an assumed right atrial pressure of 3 mmHg, the estimated right ventricular systolic pressure is 25.5 mmHg. Left Atrium: Left atrial size was normal in size. Right Atrium: Right atrial size was normal in size. Pericardium: There is no evidence of pericardial effusion. Mitral Valve: The mitral valve is abnormal. There is mild thickening of the mitral valve leaflet(s). Mild to moderate mitral annular calcification. Trivial mitral valve regurgitation. Tricuspid Valve: The tricuspid valve is normal in structure. Tricuspid valve regurgitation is trivial. Aortic Valve: The aortic valve is abnormal. Aortic valve regurgitation is mild. Mild aortic valve sclerosis is present, with no evidence of aortic valve stenosis. Pulmonic Valve: The pulmonic valve was normal in structure. Pulmonic valve regurgitation is not visualized. Aorta: The aortic root is normal in size and structure. Venous: The  inferior vena cava is normal in size with greater than 50% respiratory variability, suggesting right atrial pressure of 3 mmHg. IAS/Shunts: The interatrial septum was not assessed.  LEFT VENTRICLE PLAX 2D LVIDd:         4.10 cm  Diastology LVIDs:         2.90 cm  LV e' medial:    4.80 cm/s LV PW:         1.20 cm  LV E/e' medial:  18.9 LV IVS:        1.50 cm  LV e' lateral:   5.25 cm/s LVOT diam:     1.80 cm  LV E/e' lateral: 17.3 LV SV:         51 LV SV Index:   33 LVOT Area:     2.54 cm                          3D Volume EF:                         3D EF:        55 %  LV EDV:       110 ml                         LV ESV:       49 ml                         LV SV:        61 ml RIGHT VENTRICLE RV S prime:     10.40 cm/s TAPSE (M-mode): 2.1 cm LEFT ATRIUM             Index       RIGHT ATRIUM           Index LA diam:        3.80 cm 2.47 cm/m  RA Area:     16.40 cm LA Vol (A2C):   55.1 ml 35.77 ml/m RA Volume:   45.90 ml  29.80 ml/m LA Vol (A4C):   42.6 ml 27.65 ml/m LA Biplane Vol: 50.2 ml 32.59 ml/m  AORTIC VALVE LVOT Vmax:   80.00 cm/s LVOT Vmean:  62.100 cm/s LVOT VTI:    0.199 m  AORTA Ao Root diam: 3.10 cm Ao Asc diam:  3.30 cm MITRAL VALVE                TRICUSPID VALVE MV Area (PHT): 2.56 cm     TR Peak grad:   22.5 mmHg MV Decel Time: 296 msec     TR Vmax:        237.00 cm/s MV E velocity: 90.80 cm/s MV A velocity: 136.00 cm/s  SHUNTS MV E/A ratio:  0.67         Systemic VTI:  0.20 m                             Systemic Diam: 1.80 cm Dietrich Pates MD Electronically signed by Dietrich Pates MD Signature Date/Time: 05/13/2020/2:09:12 PM    Final    CT Angio Abd/Pel w/ and/or w/o  Result Date: 05/13/2020 CLINICAL DATA:  Epigastric pain, vomiting EXAM: CTA ABDOMEN AND PELVIS WITH CONTRAST TECHNIQUE: Multidetector CT imaging of the abdomen and pelvis was performed using the standard protocol during bolus administration of intravenous contrast. Multiplanar reconstructed images and MIPs were  obtained and reviewed to evaluate the vascular anatomy. CONTRAST:  OMNIPAQUE IOHEXOL 350 MG/ML SOLN COMPARISON:  04/09/2018 FINDINGS: VASCULAR Aorta: Moderate calcified atheromatous plaque. No aneurysm, dissection, or stenosis. Celiac: Partially calcified ostial plaque with short segment mild stenosis, patent distally. SMA: Partially calcified ostial plaque resulting in short segment mild stenosis, patent distally with classic branch anatomy. Renals: Both renal arteries are patent without evidence of aneurysm, dissection, vasculitis, fibromuscular dysplasia or significant stenosis. IMA: Patent without evidence of aneurysm, dissection, vasculitis or significant stenosis. Inflow: Scattered calcified plaque involving bilateral common and internal iliac arteries. No aneurysm, dissection, or stenosis. Proximal Outflow: Bilateral common femoral and visualized portions of the superficial and profunda femoral arteries are patent without evidence of aneurysm, dissection, vasculitis or significant stenosis. Veins: Patent patent veins, portal vein, renal veins. Iliac venous system and IVC unremarkable. No venous pathology identified. Review of the MIP images confirms the above findings. NON-VASCULAR Lower chest: Previous median sternotomy. Trace left pleural effusion. Minimal dependent atelectasis in the lung bases. Hepatobiliary: Cholecystectomy clips. Mild central intrahepatic biliary ductal dilatation and ectasias of the CBD as before. No new liver lesion.  Pancreas: 2.6 cm cystic pancreatic head lesion, previously 1.8 cm. No ductal dilatation. Spleen: Normal in size without focal abnormality. Adrenals/Urinary Tract: Adrenal glands unremarkable. Chronic left UPJ obstruction. Ureters decompressed. Moderate distension of the urinary bladder. Stomach/Bowel: Stomach is nondistended. Small bowel decompressed. Appendix not discretely identified. The colon is nondilated with a few scattered sigmoid diverticula; no adjacent  inflammatory change. Lymphatic: No abdominal or pelvic adenopathy. Reproductive: Status post hysterectomy. No adnexal masses. Other: No ascites.  No free air. Musculoskeletal: Thoracolumbar levoscoliosis apex L2 with multilevel lumbar spondylitic change. No fracture or worrisome bone lesion. IMPRESSION: 1. No significant proximal mesenteric arterial occlusive disease to suggest etiology of abdominal pain. 2. 2.6 cm cystic pancreatic head lesion, previously 1.8 cm. Consider elective outpatient MR/MRCP for further characterization. 3. Chronic left UPJ obstruction. 4. Trace left pleural effusion. Aortic Atherosclerosis (ICD10-I70.0). Electronically Signed   By: Corlis Leak M.D.   On: 05/13/2020 15:58    Cardiac Studies   Echo 05/13/20: 1. LVEF Is depressed severe hypokinesis of the mid/distal inferior,  inferoseptal walls.  Left ventricular ejection fraction, by estimation,  is 45 to 50%. The left ventricle has mildly decreased function. There is  mild left ventricular hypertrophy. Left  ventricular diastolic parameters are consistent with Grade I diastolic  dysfunction (impaired relaxation).  2. Right ventricular systolic function is normal. The right ventricular  size is normal. There is normal pulmonary artery systolic pressure.  3. The mitral valve is abnormal. Trivial mitral valve regurgitation.  4. The aortic valve is abnormal. Aortic valve regurgitation is mild. Mild  aortic valve sclerosis is present, with no evidence of aortic valve  stenosis.  5. The inferior vena cava is normal in size with greater than 50%  respiratory variability, suggesting right atrial pressure of 3 mmHg.   LHC 05/26/2011: Patent LIMA-->LAD-->diagonal Mild ostial RCA stenosis Mild ostial LCX stenosis LAD 100% occluded LVEF 60%  Patient Profile     Ms. Enzor is an 36F with CAD s/p CABG, dementia, hiatial hernia, hypertension, and hyperlipidemia admitted with nausea, vomiting, chest and back pain.   Cardiology consulted for NSTEMI.  Assessment & Plan    # NSTEMI:  # Hyperlipidemia:  High-sensitivity troponin elevated to 789 and rising.  Her chest pain seems somewhat atypical and not likely angina she had prior to requiring CABG.  Echo this admission reveals reduction in her LVEF m from 60% in 2013 when it was last assessed to 45 to 50% now.  It is quite possible that she has some ischemia.  However given her functional capacity, age, and comorbidities, and no plans for cardiac catheterization at this time.  She is in agreement with a more conservative medical strategy.  Plan for 48 hours of IV heparin to conclude tomorrow.  Continue with aspirin and add clopidogrel 75 mg daily.Continue aspirin, amlodipine, metoprolol, Imdur, and rosuvastatin.  Check fasting lipids.  It seems that her pancreatic cyst may be contributing to her abdominal discomfort, but will defer to primary team on that assessment.  # Hypertension: BP remains uncontrolled.  Increase amlodipine to 10mg .       For questions or updates, please contact [facility] Please consult [email or web address] for contact info under        Signed, , MD  05/14/2020, 10:10 AM

## 2020-05-14 NOTE — Progress Notes (Signed)
ANTICOAGULATION CONSULT NOTE - Follow Up Consult  Pharmacy Consult for IV Heparin Indication: chest pain/ACS  Allergies  Allergen Reactions  . Aspirin Nausea And Vomiting    Upset stomach  . Codeine Nausea And Vomiting    Upset stomach    Patient Measurements: Height: 5\' 4"  (162.6 cm) Weight: 52.9 kg (116 lb 10 oz) IBW/kg (Calculated) : 54.7 Heparin Dosing Weight: 51.7 kg  Vital Signs: Temp: 98.4 F (36.9 C) (02/22 2000) Temp Source: Oral (02/22 2000) BP: 135/69 (02/22 2000) Pulse Rate: 64 (02/22 2000)  Labs: Recent Labs    05/12/20 2130 05/12/20 2328 05/13/20 0248 05/13/20 0257 05/13/20 0520 05/13/20 1159 05/14/20 0549 05/14/20 1202 05/14/20 2119  HGB 10.8*  --  9.9* 10.9*  --   --  9.5*  --   --   HCT 33.3*  --  29.0* 32.3*  --   --  28.4*  --   --   PLT 239  --   --  213  --   --  189  --   --   HEPARINUNFRC  --   --   --   --   --    < > 0.30 0.24* 0.55  CREATININE 1.10*  --   --  0.91  --   --  1.02*  --   --   TROPONINIHS 31* 169*  --  532* 789*  --   --   --   --    < > = values in this interval not displayed.    Estimated Creatinine Clearance: 33.1 mL/min (A) (by C-G formula based on SCr of 1.02 mg/dL (H)).   Medical History: Past Medical History:  Diagnosis Date  . Baker's cyst, ruptured   . CAD (coronary artery disease)   . Dementia (HCC)   . History of hepatitis   . Hyperlipidemia   . Hypertension   . Lumbar degenerative disc disease   . Nephrolithiasis   . Spasmodic torticollis     Assessment: 87 yr old female presented with N/V/abd pain. Trop 169>>789. Pharmacy was consulted for heparin for ACS (pt was on no anticoagulation PTA). Per Cardiology, will manage NSTEMI medically unless pt develops refractory chest pain; planning for 48 hrs of IV heparin.   Heparin level ~8.5 hrs after heparin infusion was increased to 800 units/hr was 0.55 units/ml, which is within the goal range for this pt. H/H 9.5/28.4, plt 189. Per RN, no issues with IV  or bleeding observed.  Goal of Therapy:  Heparin level 0.3-0.7 units/ml Monitor platelets by anticoagulation protocol: Yes   Plan:  Continue heparin infusion at 800 units/hr Check confirmatory heparin level in 8 hrs Monitor daily heparin level, CBC Monitor for bleeding F/U duration of IV heparin (48 hrs)  08-27-1970, PharmD, BCPS, [facility] Clinical Pharmacist 05/14/2020 10:02 PM

## 2020-05-14 NOTE — Plan of Care (Signed)
  Problem: Education: Goal: Knowledge of General Education information will improve Description Including pain rating scale, medication(s)/side effects and non-pharmacologic comfort measures Outcome: Progressing   Problem: Clinical Measurements: Goal: Ability to maintain clinical measurements within normal limits will improve Outcome: Progressing Goal: Will remain free from infection Outcome: Progressing Goal: Diagnostic test results will improve Outcome: Progressing Goal: Respiratory complications will improve Outcome: Progressing Goal: Cardiovascular complication will be avoided Outcome: Progressing   Problem: Activity: Goal: Risk for activity intolerance will decrease Outcome: Progressing   Problem: Nutrition: Goal: Adequate nutrition will be maintained Outcome: Progressing   Problem: Coping: Goal: Level of anxiety will decrease Outcome: Progressing   Problem: Elimination: Goal: Will not experience complications related to bowel motility Outcome: Progressing   Problem: Pain Managment: Goal: General experience of comfort will improve Outcome: Progressing   

## 2020-05-14 NOTE — Progress Notes (Signed)
With hard of hearing with no hearing aid, would read lips.

## 2020-05-15 DIAGNOSIS — Z515 Encounter for palliative care: Secondary | ICD-10-CM

## 2020-05-15 DIAGNOSIS — R112 Nausea with vomiting, unspecified: Secondary | ICD-10-CM

## 2020-05-15 DIAGNOSIS — F0390 Unspecified dementia without behavioral disturbance: Secondary | ICD-10-CM

## 2020-05-15 LAB — CBC
HCT: 28.9 % — ABNORMAL LOW (ref 36.0–46.0)
Hemoglobin: 9.7 g/dL — ABNORMAL LOW (ref 12.0–15.0)
MCH: 31.7 pg (ref 26.0–34.0)
MCHC: 33.6 g/dL (ref 30.0–36.0)
MCV: 94.4 fL (ref 80.0–100.0)
Platelets: 194 10*3/uL (ref 150–400)
RBC: 3.06 MIL/uL — ABNORMAL LOW (ref 3.87–5.11)
RDW: 13.4 % (ref 11.5–15.5)
WBC: 6.7 10*3/uL (ref 4.0–10.5)
nRBC: 0 % (ref 0.0–0.2)

## 2020-05-15 LAB — BASIC METABOLIC PANEL
Anion gap: 9 (ref 5–15)
BUN: 9 mg/dL (ref 8–23)
CO2: 20 mmol/L — ABNORMAL LOW (ref 22–32)
Calcium: 8.4 mg/dL — ABNORMAL LOW (ref 8.9–10.3)
Chloride: 110 mmol/L (ref 98–111)
Creatinine, Ser: 0.92 mg/dL (ref 0.44–1.00)
GFR, Estimated: >60 mL/min (ref >60)
Glucose, Bld: 118 mg/dL — ABNORMAL HIGH (ref 70–99)
Potassium: 3.7 mmol/L (ref 3.5–5.1)
Sodium: 139 mmol/L (ref 135–145)

## 2020-05-15 LAB — GLUCOSE, CAPILLARY
Glucose-Capillary: 109 mg/dL — ABNORMAL HIGH (ref 70–99)
Glucose-Capillary: 111 mg/dL — ABNORMAL HIGH (ref 70–99)
Glucose-Capillary: 118 mg/dL — ABNORMAL HIGH (ref 70–99)
Glucose-Capillary: 120 mg/dL — ABNORMAL HIGH (ref 70–99)
Glucose-Capillary: 120 mg/dL — ABNORMAL HIGH (ref 70–99)
Glucose-Capillary: 129 mg/dL — ABNORMAL HIGH (ref 70–99)
Glucose-Capillary: 97 mg/dL (ref 70–99)

## 2020-05-15 LAB — HEPARIN LEVEL (UNFRACTIONATED): Heparin Unfractionated: 0.52 IU/mL (ref 0.30–0.70)

## 2020-05-15 MED ORDER — HYDROMORPHONE HCL 1 MG/ML PO LIQD: 1.0000 mg | ORAL | Status: DC | PRN

## 2020-05-15 MED ORDER — TAMSULOSIN HCL 0.4 MG PO CAPS: 0.4000 mg | ORAL_CAPSULE | Freq: Every day | ORAL | Status: DC

## 2020-05-15 MED ORDER — BETHANECHOL CHLORIDE 5 MG PO TABS
5.0000 mg | ORAL_TABLET | Freq: Three times a day (TID) | ORAL | Status: DC
  Administered 2020-05-15 – 2020-05-16 (×4): 5 mg via ORAL
  Filled 2020-05-15 (×5): qty 1

## 2020-05-15 NOTE — Plan of Care (Signed)

## 2020-05-15 NOTE — Progress Notes (Signed)
PROGRESS NOTE  Gloria Rowland MEQ:[id number] DOB: [birth year] DOA: 05/12/2020 PCP: Ignatius Specking, MD  HPI/Recap of past 24 hours: Gloria Rowland a 87 y.o.femalewith medical history significant forCAD status post CABG in 1998, hypertension, pancreatic head cyst, hyperlipidemia, and dementia, now presenting to the emergency department for evaluation of chest pain, nausea, and vomiting, work-up suggested NSTEMI and she was admitted to the hospital for further work-up.  05/15/20: Seen and examined at her bedside.  She reports lower abdominal discomfort.  She denies any nausea.  No diarrhea and no constipation.  Urinary retention noted this morning with nearly 1 L of urine retained in the bladder, she had an in and out cath.  Assessment/Plan: Principal Problem:   NSTEMI (non-ST elevated myocardial infarction) (HCC) Active Problems:   Hypertension   Intractable nausea and vomiting   Dementia without behavioral disturbance (HCC)   Hypokalemia   Chronic kidney disease, stage 3a (HCC)   Hyperglycemia   Severe protein-calorie malnutrition (HCC)   Normocytic anemia  NSTEMI with chest pain She was seen by cardiology She is currently on aspirin, Plavix, beta-blocker, statin, and Imdur. She is not a candidate for invasive testing or procedures. She denies any anginal symptoms this morning.  Acute urinary retention Nearly 1 L of urine retained in the bladder this morning. In and out cath performed. If recurrent may start bethanechol  Essential hypertension BP is not at goal Increased dose of amlodipine to 10 mg daily.  Hyperlipidemia Obtain lipid panel Continue gemfibrozil and Crestor.  Chronic anxiety/depression Continue home Celexa  History of pancreatic head cyst, slightly grown in size. Outpatient MRCP is recommended  Physical debility PT OT to assess  Goals of care She is DNR Palliative care medicine following.    Code Status: DNR.  Family Communication:  None at bedside.  Disposition Plan: Likely DC to SNF.   Consultants:  Cardiology  Palliative care medicine.  Procedures:  2D echo  Antimicrobials:  None  DVT prophylaxis: SCDs.  Status is: Inpatient    Dispo:  Patient From: Home  Planned Disposition: Home with Health Care [facility]  Expected discharge date: 05/15/2020  Medically stable for discharge: No, due to acute urinary retention.         Objective: Vitals:   05/15/20 0318 05/15/20 0750 05/15/20 1045 05/15/20 1204  BP: (!) 141/58 (!) 157/75 (!) 150/73 113/60  Pulse: (!) 57 (!) 56 62 (!) 52  Resp: 20 (!) 22  19  Temp: 98.1 F (36.7 C) 97.9 F (36.6 C)  98.2 F (36.8 C)  TempSrc: Oral   Oral  SpO2: 94% 98%  97%  Weight:      Height:        Intake/Output Summary (Last 24 hours) at 05/15/2020 1258 Last data filed at 05/15/2020 0400 Gross per 24 hour  Intake 332.54 ml  Output --  Net 332.54 ml   Filed Weights   05/12/20 2126 05/13/20 2018  Weight: 51.7 kg 52.9 kg    Exam:  . General: 87 y.o. year-old female well developed well nourished in no acute distress.  Alert and interactive. . Cardiovascular: Regular rate and rhythm with no rubs or gallops.  No thyromegaly or JVD noted.   Marland Kitchen Respiratory: Clear to auscultation with no wheezes or rales. Good inspiratory effort. . Abdomen: Soft, mildly distended, bowel sounds present. . Musculoskeletal: No lower extremity edema bilaterally. . Skin: No ulcerative lesions noted or rashes, . Psychiatry: Mood is appropriate for condition and setting   Data Reviewed:  CBC: Recent Labs  Lab 05/12/20 2130 05/13/20 0248 05/13/20 0257 05/14/20 0549 05/15/20 0027  WBC 10.5  --  9.5 6.0 6.7  HGB 10.8* 9.9* 10.9* 9.5* 9.7*  HCT 33.3* 29.0* 32.3* 28.4* 28.9*  MCV 95.1  --  95.0 93.4 94.4  PLT 239  --  213 189 194   Basic Metabolic Panel: Recent Labs  Lab 05/12/20 2130 05/12/20 2328 05/13/20 0248 05/13/20 0257 05/14/20 0549 05/15/20 0027  NA 140  --  141  139 142 139  K 2.9*  --  3.4* 3.5 3.8 3.7  CL 105  --   --  105 111 110  CO2 19*  --   --  19* 24 20*  GLUCOSE 227*  --   --  168* 105* 118*  BUN 15  --   --  13 9 9   CREATININE 1.10*  --   --  0.91 1.02* 0.92  CALCIUM 8.8*  --   --  8.5* 8.3* 8.4*  MG  --  1.8  --   --   --   --    GFR: Estimated Creatinine Clearance: 36.7 mL/min (by C-G formula based on SCr of 0.92 mg/dL). Liver Function Tests: Recent Labs  Lab 05/12/20 2203  AST 36  ALT 15  ALKPHOS 56  BILITOT 0.9  PROT 6.4*  ALBUMIN 3.8   Recent Labs  Lab 05/12/20 2203 05/14/20 0549  LIPASE 25 23   No results for input(s): AMMONIA in the last 168 hours. Coagulation Profile: No results for input(s): INR, PROTIME in the last 168 hours. Cardiac Enzymes: No results for input(s): CKTOTAL, CKMB, CKMBINDEX, TROPONINI in the last 168 hours. BNP (last 3 results) No results for input(s): PROBNP in the last 8760 hours. HbA1C: Recent Labs    05/13/20 0257  HGBA1C 5.3   CBG: Recent Labs  Lab 05/14/20 2012 05/15/20 0046 05/15/20 0420 05/15/20 0805 05/15/20 1115  GLUCAP 150* 118* 129* 109* 120*   Lipid Profile: No results for input(s): CHOL, HDL, LDLCALC, TRIG, CHOLHDL, LDLDIRECT in the last 72 hours. Thyroid Function Tests: No results for input(s): TSH, T4TOTAL, FREET4, T3FREE, THYROIDAB in the last 72 hours. Anemia Panel: No results for input(s): VITAMINB12, FOLATE, FERRITIN, TIBC, IRON, RETICCTPCT in the last 72 hours. Urine analysis:    Component Value Date/Time   COLORURINE YELLOW 05/13/2020 0016   APPEARANCEUR CLOUDY (A) 05/13/2020 0016   LABSPEC 1.012 05/13/2020 0016   PHURINE 8.0 05/13/2020 0016   GLUCOSEU 50 (A) 05/13/2020 0016   HGBUR NEGATIVE 05/13/2020 0016   BILIRUBINUR NEGATIVE 05/13/2020 0016   KETONESUR 5 (A) 05/13/2020 0016   PROTEINUR 30 (A) 05/13/2020 0016   NITRITE NEGATIVE 05/13/2020 0016   LEUKOCYTESUR SMALL (A) 05/13/2020 0016   Sepsis  Labs: @LABRCNTIP (procalcitonin:4,lacticidven:4)  ) Recent Results (from the past 240 hour(s))  Resp Panel by RT-PCR (Flu A&B, Covid) Nasopharyngeal Swab     Status: None   Collection Time: 05/12/20 10:23 PM   Specimen: Nasopharyngeal Swab; Nasopharyngeal(NP) swabs in vial transport medium  Result Value Ref Range Status   SARS Coronavirus 2 by RT PCR NEGATIVE NEGATIVE Final    Comment: (NOTE) SARS-CoV-2 target nucleic acids are NOT DETECTED.  The SARS-CoV-2 RNA is generally detectable in upper respiratory specimens during the acute phase of infection. The lowest concentration of SARS-CoV-2 viral copies this assay can detect is 138 copies/mL. A negative result does not preclude SARS-Cov-2 infection and should not be used as the sole basis for treatment or other patient management decisions.  A negative result may occur with  improper specimen collection/handling, submission of specimen other than nasopharyngeal swab, presence of viral mutation(s) within the areas targeted by this assay, and inadequate number of viral copies(<138 copies/mL). A negative result must be combined with clinical observations, patient history, and epidemiological information. The expected result is Negative.  Fact Sheet for Patients:  BloggerCourse.com  Fact Sheet for Healthcare Providers:  SeriousBroker.it  This test is no t yet approved or cleared by the [place] FDA and  has been authorized for detection and/or diagnosis of SARS-CoV-2 by FDA under an Emergency Use Authorization (EUA). This EUA will remain  in effect (meaning this test can be used) for the duration of the COVID-19 declaration under Section 564(b)(1) of the Act, 21 U.S.C.section 360bbb-3(b)(1), unless the authorization is terminated  or revoked sooner.       Influenza A by PCR NEGATIVE NEGATIVE Final   Influenza B by PCR NEGATIVE NEGATIVE Final    Comment: (NOTE) The Xpert  Xpress SARS-CoV-2/FLU/RSV plus assay is intended as an aid in the diagnosis of influenza from Nasopharyngeal swab specimens and should not be used as a sole basis for treatment. Nasal washings and aspirates are unacceptable for Xpert Xpress SARS-CoV-2/FLU/RSV testing.  Fact Sheet for Patients: BloggerCourse.com  Fact Sheet for Healthcare Providers: SeriousBroker.it  This test is not yet approved or cleared by the [place] FDA and has been authorized for detection and/or diagnosis of SARS-CoV-2 by FDA under an Emergency Use Authorization (EUA). This EUA will remain in effect (meaning this test can be used) for the duration of the COVID-19 declaration under Section 564(b)(1) of the Act, 21 U.S.C. section 360bbb-3(b)(1), unless the authorization is terminated or revoked.  Performed at [facility], [address], [place], [place] [address]   MRSA PCR Screening     Status: None   Collection Time: 05/13/20  8:22 PM   Specimen: Nasopharyngeal  Result Value Ref Range Status   MRSA by PCR NEGATIVE NEGATIVE Final    Comment:        The GeneXpert MRSA Assay (FDA approved for NASAL specimens only), is one component of a comprehensive MRSA colonization surveillance program. It is not intended to diagnose MRSA infection nor to guide or monitor treatment for MRSA infections. Performed at [facility], [address], [place], [place] [address]       Studies: No results found.  Scheduled Meds: . amLODipine  10 mg Oral Daily  . aspirin EC  81 mg Oral Daily  . citalopram  40 mg Oral Daily  . clopidogrel  75 mg Oral Daily  . gemfibrozil  300 mg Oral BID AC  . insulin aspart  0-6 Units Subcutaneous Q4H  . isosorbide mononitrate  30 mg Oral Daily  . lisinopril  20 mg Oral Daily  . memantine  10 mg Oral BID  . metoprolol succinate  25 mg Oral Daily  . pantoprazole  40 mg Oral Daily  . polyethylene glycol  17 g  Oral Daily  . rosuvastatin  20 mg Oral Daily    Continuous Infusions:   LOS: 2 days     Darlin Drop, MD [facility] Pager [phone]  If 7PM-7AM, please contact night-coverage [email or web address] Password [facility] 05/15/2020, 12:58 PM

## 2020-05-15 NOTE — Consult Note (Signed)
Consultation Note Date: 05/15/2020   Patient Name: Gloria Rowland  DOB: 21-Dec-1932  MRN: [id number]  Age / Sex: 87 y.o., female  PCP: Ignatius Specking, MD Referring Physician: Darlin Drop, DO  Reason for Consultation: Establishing goals of care  HPI/Patient Profile: 87 y.o. female  with past medical history of CAD sp CABG, CKD 3, who was admitted on 05/12/2020 with sharp left sided chest pain.  She was evaluated by cardiology and treated conservatively for an NSTEMI.  She is not a candidate for invasive procedures.   She also has a cyst in the head of her pancreas that has been followed for several years.  It has grown from 1.8 cm to 2.6 cm.   Clinical Assessment and Goals of Care:  I have reviewed medical records including EPIC notes, labs and imaging, received report from the care team, examined the patient and spoke on the phone with Gloria Rowland (daughter) to discuss diagnosis prognosis, GOC, EOL wishes, disposition and options.  I introduced Palliative Medicine as specialized medical care for people living with serious illness. It focuses on providing relief from the symptoms and stress of a serious illness.   In speaking with Ms. Rowland she tells me that she had a terrible night.  Her stomach became very swollen and painful.  On exam her stomach is still somewhat distended.  She has a clear liquid breakfast tray at bedside that is untouched.  She denies gas or bowel movement.  She tells me she does not feel like she can eat or her stomach will swell again.  We discussed a brief life review of the patient. Gloria Rowland explains that the patient was living at home with her 78 yo husband Gloria Rowland.  Unfortunately both of them need assistance now.  Gloria Rowland plans to take her mother to her home and Gloria Rowland daughter will do likewise.  Gloria Rowland has arranged a great deal of help in the home to care for her mother.  She requests  PT/OT and Palliative Care at home.   She would like to have Palliative Care thru [facility] [place].  We spoke briefly of the differences between Palliative and Hospice and that if Gloria Rowland worsened she could easily switch from Palliative to Hospice care within the same group.  As far as functional and nutritional status the patient was walking and independent of ADLs prior to her NSTEMI.  Now she is so weak she needs assistance with feeding.  We discussed her current illness and what it means in the larger context of her on-going co-morbidities.  Natural disease trajectory and expectations at EOL were discussed.  Specifically we discussed her CABG years ago now with a heart attack but not eligible for invasive procedures - not eligible to structurally correct the problem.  We also discussed the cystic lesion on Ms. Rowland pancreas.  It has been present for at least 3 years.  Gloria Rowland stated she had never been told about the lesion on her mother's pancreas.  We talked about  the growth from 1.8 to 2.6 cm.  Whatever it is it has been slow growing, but I question now whether or not it has been contributing to her nausea and abdominal pain.   I suggested that at this point the best approach is to treat the symptoms of nausea and pain.   Gloria Rowland agreed.  Gloria Rowland asked about patient's SSRI and dementia medications.  I assured her that these have been restarted.  Gloria Rowland expressed that she does not feel her mother is ready for discharge yet - I explained that I'm not her medical doctor but hopefully we can get her eating a little better prior to discharge.  Questions and concerns were addressed.  The family was encouraged to call with questions or concerns.    Primary Decision Maker:  NEXT OF KIN daughter Gloria Rowland    SUMMARY OF RECOMMENDATIONS    Daughter requests that mother discharge to her home when she is appropriate for DC. She will need a narrow wheelchair (for narrow  doorways in the home) Daughter requests PT / OT and Palliative (thru [facility]) to follow the patient at home. Will request PT/OT evals Feeding assistance - patient very weak  Code Status/Advance Care Planning:  DNR/DNI   Symptom Management:   Agree with phenergan for nausea patient may need to see if she can tolerate and oral dose before discharge.  Agree with protonix  Added very low dose dilaudid solution for pain prn   Palliative Prophylaxis:   Frequent Pain Assessment   Feeding assistance - patient is very weak.  Psycho-social/Spiritual:   Desire for further Chaplaincy support: not discussed.  Prognosis: will be able to better determine after PT/OT evaluations, but am concerned given weakness, frailty, and poor PO intake.    Discharge Planning: Home with Home Health and palliative      Primary Diagnoses: Present on Admission: . NSTEMI (non-ST elevated myocardial infarction) (HCC) . Dementia without behavioral disturbance (HCC) . Hypertension . Hypokalemia . Chronic kidney disease, stage 3a (HCC) . Hyperglycemia . Severe protein-calorie malnutrition (HCC) . Intractable nausea and vomiting . Normocytic anemia   I have reviewed the medical record, interviewed the patient and family, and examined the patient. The following aspects are pertinent.  Past Medical History:  Diagnosis Date  . Baker's cyst, ruptured   . CAD (coronary artery disease)   . Dementia (HCC)   . History of hepatitis   . Hyperlipidemia   . Hypertension   . Lumbar degenerative disc disease   . Nephrolithiasis   . Spasmodic torticollis    Social History   Socioeconomic History  . Marital status: Married    Spouse name: Not on file  . Number of children: Not on file  . Years of education: Not on file  . Highest education level: Not on file  Occupational History  . Not on file  Tobacco Use  . Smoking status: Never Smoker  . Smokeless tobacco: Never Used  Vaping Use  .  Vaping Use: Never used  Substance and Sexual Activity  . Alcohol use: No  . Drug use: No  . Sexual activity: Not on file  Other Topics Concern  . Not on file  Social History Narrative   Divorced, remarried.    Social Determinants of Health   Financial Resource Strain: Not on file  Food Insecurity: Not on file  Transportation Needs: Not on file  Physical Activity: Not on file  Stress: Not on file  Social Connections: Not on file   Family  History  Problem Relation Age of Onset  . Diabetes Sister   . Diabetes Sister     Allergies  Allergen Reactions  . Aspirin Nausea And Vomiting    Upset stomach  . Codeine Nausea And Vomiting    Upset stomach     Vital Signs: BP (!) 157/75 (BP Location: Left Arm)   Pulse (!) 56   Temp 97.9 F (36.6 C)   Resp (!) 22   Ht 5\' 4"  (1.626 m)   Wt 52.9 kg   SpO2 98%   BMI 20.02 kg/m  Pain Scale: 0-10   Pain Score: 2    SpO2: SpO2: 98 % O2 Device:SpO2: 98 % O2 Flow Rate: .     Palliative Assessment/Data: 20%     Time In: 12:00 Time Out: 1:00 Time Total: 60 min. Visit consisted of counseling and education dealing with the complex and emotionally intense issues surrounding the need for palliative care and symptom management in the setting of serious and potentially life-threatening illness. Greater than 50%  of this time was spent counseling and coordinating care related to the above assessment and plan.  Signed by: , PA-C Palliative Medicine  Please contact Palliative Medicine Team phone at [phone] for questions and concerns.  For individual provider: See [phone]

## 2020-05-15 NOTE — Progress Notes (Signed)
Progress Note  Patient Name: Gloria Rowland Date of Encounter: 05/15/2020  [facility] Cardiologist: Chilton Si, Rowland   Subjective   Feeling well.  Denies chest pain.  She had sharp left-sided chest pain prior to admission that is different than the pain she had prior to her CABG.    Inpatient Medications    Scheduled Meds: . amLODipine  10 mg Oral Daily  . aspirin EC  81 mg Oral Daily  . citalopram  40 mg Oral Daily  . clopidogrel  75 mg Oral Daily  . gemfibrozil  300 mg Oral BID AC  . insulin aspart  0-6 Units Subcutaneous Q4H  . isosorbide mononitrate  30 mg Oral Daily  . lisinopril  20 mg Oral Daily  . memantine  10 mg Oral BID  . metoprolol succinate  25 mg Oral Daily  . pantoprazole  40 mg Oral Daily  . polyethylene glycol  17 g Oral Daily  . rosuvastatin  20 mg Oral Daily   Continuous Infusions:  PRN Meds: acetaminophen, alum & mag hydroxide-simeth, nitroGLYCERIN, ondansetron (ZOFRAN) IV, promethazine   Vital Signs    Vitals:   05/14/20 2000 05/14/20 2321 05/15/20 0318 05/15/20 0750  BP: 135/69 (!) 116/54 (!) 141/58 (!) 157/75  Pulse: 64 (!) 58 (!) 57 (!) 56  Resp: 20 16 20  (!) 22  Temp: 98.4 F (36.9 C) 98.2 F (36.8 C) 98.1 F (36.7 C) 97.9 F (36.6 C)  TempSrc: Oral Oral Oral   SpO2: 95% 96% 94% 98%  Weight:      Height:        Intake/Output Summary (Last 24 hours) at 05/15/2020 0913 Last data filed at 05/15/2020 0400 Gross per 24 hour  Intake 553.54 ml  Output --  Net 553.54 ml   Last 3 Weights 05/13/2020 05/12/2020 04/10/2018  Weight (lbs) 116 lb 10 oz 113 lb 15.7 oz 113 lb 15.7 oz  Weight (kg) 52.9 kg 51.7 kg 51.7 kg      Telemetry    Sinus rhythm.  Sinus bradycardia.- Personally Reviewed  ECG    Sinus rhythm.  Rate 62 bpm.  PACs.  - Personally Reviewed  Physical Exam   VS:  BP (!) 157/75 (BP Location: Left Arm)   Pulse (!) 56   Temp 97.9 F (36.6 C)   Resp (!) 22   Ht 5\' 4"  (1.626 m)   Wt 52.9 kg   SpO2 98%   BMI  20.02 kg/m  , BMI Body mass index is 20.02 kg/m. GENERAL:  Frail.  No acute distress.  HEENT: Pupils equal round and reactive, fundi not visualized, oral mucosa unremarkable NECK:  No jugular venous distention, waveform within normal limits, carotid upstroke brisk and symmetric LUNGS:  Clear to auscultation bilaterally HEART:  RRR.  PMI not displaced or sustained,S1 and S2 within normal limits, no S3, no S4, no clicks, no rubs, no murmurs CHEST: +chest wall tenderness to palpation ABD:  Flat, positive bowel sounds normal in frequency in pitch, no bruits, no rebound, no guarding, no midline pulsatile mass, no hepatomegaly, no splenomegaly EXT:  2 plus pulses throughout, no edema, no cyanosis no clubbing SKIN:  No rashes no nodules NEURO:  Cranial nerves II through XII grossly intact, motor grossly intact throughout PSYCH:  Cognitively intact, oriented to person place and time   Labs    High Sensitivity Troponin:   Recent Labs  Lab 05/12/20 2130 05/12/20 2328 05/13/20 0257 05/13/20 0520  TROPONINIHS 31* 169* 532* 789*  Chemistry Recent Labs  Lab 05/12/20 2203 05/13/20 0248 05/13/20 0257 05/14/20 0549 05/15/20 0027  NA  --    < > 139 142 139  K  --    < > 3.5 3.8 3.7  CL  --   --  105 111 110  CO2  --   --  19* 24 20*  GLUCOSE  --   --  168* 105* 118*  BUN  --   --  13 9 9   CREATININE  --   --  0.91 1.02* 0.92  CALCIUM  --   --  8.5* 8.3* 8.4*  PROT 6.4*  --   --   --   --   ALBUMIN 3.8  --   --   --   --   AST 36  --   --   --   --   ALT 15  --   --   --   --   ALKPHOS 56  --   --   --   --   BILITOT 0.9  --   --   --   --   GFRNONAA  --   --  >60 54* >60  ANIONGAP  --   --  15 7 9    < > = values in this interval not displayed.     Hematology Recent Labs  Lab 05/13/20 0257 05/14/20 0549 05/15/20 0027  WBC 9.5 6.0 6.7  RBC 3.40* 3.04* 3.06*  HGB 10.9* 9.5* 9.7*  HCT 32.3* 28.4* 28.9*  MCV 95.0 93.4 94.4  MCH 32.1 31.3 31.7  MCHC 33.7 33.5 33.6  RDW  13.2 13.6 13.4  PLT 213 189 194    BNPNo results for input(s): BNP, PROBNP in the last 168 hours.   DDimer No results for input(s): DDIMER in the last 168 hours.   Radiology    ECHOCARDIOGRAM COMPLETE  Result Date: 05/13/2020    ECHOCARDIOGRAM REPORT   Patient Name:   Gloria Rowland Date of Exam: 05/13/2020 Medical Rec #:  Tarry Kos        Height:       64.0 in Accession #:    05/15/2020       Weight:       114.0 lb Date of Birth:  [birth year]        BSA:          1.541 m Patient Age:    88 years         BP:           128/79 mmHg Patient Gender: F                HR:           56 bpm. Exam Location:  Inpatient Procedure: Cardiac Doppler, Color Doppler and Strain Analysis STAT ECHO Indications:    Elevated Troponin  History:        Patient has no prior history of Echocardiogram examinations.                 CAD, Prior CABG; Risk Factors:Hypertension and Dyslipidemia.  Sonographer:    [id number] RDCS Referring Phys: [id number] Lauris Keepers Nichols IMPRESSIONS  1. LVEF Is depressed severe hypokinesis of the mid/distal inferior, inferoseptal walls. . Left ventricular ejection fraction, by estimation, is 45 to 50%. The left ventricle has mildly decreased function. There is mild left ventricular hypertrophy. Left  ventricular diastolic parameters are consistent with Grade I diastolic dysfunction (impaired relaxation).  2. Right ventricular  systolic function is normal. The right ventricular size is normal. There is normal pulmonary artery systolic pressure.  3. The mitral valve is abnormal. Trivial mitral valve regurgitation.  4. The aortic valve is abnormal. Aortic valve regurgitation is mild. Mild aortic valve sclerosis is present, with no evidence of aortic valve stenosis.  5. The inferior vena cava is normal in size with greater than 50% respiratory variability, suggesting right atrial pressure of 3 mmHg. FINDINGS  Left Ventricle: LVEF Is depressed severe hypokinesis of the mid/distal inferior, inferoseptal  walls. Left ventricular ejection fraction, by estimation, is 45 to 50%. The left ventricle has mildly decreased function. The left ventricular internal cavity size was normal in size. There is mild left ventricular hypertrophy. Left ventricular diastolic parameters are consistent with Grade I diastolic dysfunction (impaired relaxation). Right Ventricle: The right ventricular size is normal. No increase in right ventricular wall thickness. Right ventricular systolic function is normal. There is normal pulmonary artery systolic pressure. The tricuspid regurgitant velocity is 2.37 m/s, and  with an assumed right atrial pressure of 3 mmHg, the estimated right ventricular systolic pressure is 25.5 mmHg. Left Atrium: Left atrial size was normal in size. Right Atrium: Right atrial size was normal in size. Pericardium: There is no evidence of pericardial effusion. Mitral Valve: The mitral valve is abnormal. There is mild thickening of the mitral valve leaflet(s). Mild to moderate mitral annular calcification. Trivial mitral valve regurgitation. Tricuspid Valve: The tricuspid valve is normal in structure. Tricuspid valve regurgitation is trivial. Aortic Valve: The aortic valve is abnormal. Aortic valve regurgitation is mild. Mild aortic valve sclerosis is present, with no evidence of aortic valve stenosis. Pulmonic Valve: The pulmonic valve was normal in structure. Pulmonic valve regurgitation is not visualized. Aorta: The aortic root is normal in size and structure. Venous: The inferior vena cava is normal in size with greater than 50% respiratory variability, suggesting right atrial pressure of 3 mmHg. IAS/Shunts: The interatrial septum was not assessed.  LEFT VENTRICLE PLAX 2D LVIDd:         4.10 cm  Diastology LVIDs:         2.90 cm  LV e' medial:    4.80 cm/s LV PW:         1.20 cm  LV E/e' medial:  18.9 LV IVS:        1.50 cm  LV e' lateral:   5.25 cm/s LVOT diam:     1.80 cm  LV E/e' lateral: 17.3 LV SV:         51 LV  SV Index:   33 LVOT Area:     2.54 cm                          3D Volume EF:                         3D EF:        55 %                         LV EDV:       110 ml                         LV ESV:       49 ml  LV SV:        61 ml RIGHT VENTRICLE RV S prime:     10.40 cm/s TAPSE (M-mode): 2.1 cm LEFT ATRIUM             Index       RIGHT ATRIUM           Index LA diam:        3.80 cm 2.47 cm/m  RA Area:     16.40 cm LA Vol (A2C):   55.1 ml 35.77 ml/m RA Volume:   45.90 ml  29.80 ml/m LA Vol (A4C):   42.6 ml 27.65 ml/m LA Biplane Vol: 50.2 ml 32.59 ml/m  AORTIC VALVE LVOT Vmax:   80.00 cm/s LVOT Vmean:  62.100 cm/s LVOT VTI:    0.199 m  AORTA Ao Root diam: 3.10 cm Ao Asc diam:  3.30 cm MITRAL VALVE                TRICUSPID VALVE MV Area (PHT): 2.56 cm     TR Peak grad:   22.5 mmHg MV Decel Time: 296 msec     TR Vmax:        237.00 cm/s MV E velocity: 90.80 cm/s MV A velocity: 136.00 cm/s  SHUNTS MV E/A ratio:  0.67         Systemic VTI:  0.20 m                             Systemic Diam: 1.80 cm Gloria Rowland Electronically signed by Gloria Rowland Signature Date/Time: 05/13/2020/2:09:12 PM    Final    CT Angio Abd/Pel w/ and/or w/o  Result Date: 05/13/2020 CLINICAL DATA:  Epigastric pain, vomiting EXAM: CTA ABDOMEN AND PELVIS WITH CONTRAST TECHNIQUE: Multidetector CT imaging of the abdomen and pelvis was performed using the standard protocol during bolus administration of intravenous contrast. Multiplanar reconstructed images and MIPs were obtained and reviewed to evaluate the vascular anatomy. CONTRAST:  100mL OMNIPAQUE IOHEXOL 350 MG/ML SOLN COMPARISON:  04/09/2018 FINDINGS: VASCULAR Aorta: Moderate calcified atheromatous plaque. No aneurysm, dissection, or stenosis. Celiac: Partially calcified ostial plaque with short segment mild stenosis, patent distally. SMA: Partially calcified ostial plaque resulting in short segment mild stenosis, patent distally with classic branch anatomy.  Renals: Both renal arteries are patent without evidence of aneurysm, dissection, vasculitis, fibromuscular dysplasia or significant stenosis. IMA: Patent without evidence of aneurysm, dissection, vasculitis or significant stenosis. Inflow: Scattered calcified plaque involving bilateral common and internal iliac arteries. No aneurysm, dissection, or stenosis. Proximal Outflow: Bilateral common femoral and visualized portions of the superficial and profunda femoral arteries are patent without evidence of aneurysm, dissection, vasculitis or significant stenosis. Veins: Patent patent veins, portal vein, renal veins. Iliac venous system and IVC unremarkable. No venous pathology identified. Review of the MIP images confirms the above findings. NON-VASCULAR Lower chest: Previous median sternotomy. Trace left pleural effusion. Minimal dependent atelectasis in the lung bases. Hepatobiliary: Cholecystectomy clips. Mild central intrahepatic biliary ductal dilatation and ectasias of the CBD as before. No new liver lesion. Pancreas: 2.6 cm cystic pancreatic head lesion, previously 1.8 cm. No ductal dilatation. Spleen: Normal in size without focal abnormality. Adrenals/Urinary Tract: Adrenal glands unremarkable. Chronic left UPJ obstruction. Ureters decompressed. Moderate distension of the urinary bladder. Stomach/Bowel: Stomach is nondistended. Small bowel decompressed. Appendix not discretely identified. The colon is nondilated with a few scattered sigmoid diverticula; no adjacent inflammatory change. Lymphatic: No abdominal or pelvic adenopathy.  Reproductive: Status post hysterectomy. No adnexal masses. Other: No ascites.  No free air. Musculoskeletal: Thoracolumbar levoscoliosis apex L2 with multilevel lumbar spondylitic change. No fracture or worrisome bone lesion. IMPRESSION: 1. No significant proximal mesenteric arterial occlusive disease to suggest etiology of abdominal pain. 2. 2.6 cm cystic pancreatic head lesion,  previously 1.8 cm. Consider elective outpatient MR/MRCP for further characterization. 3. Chronic left UPJ obstruction. 4. Trace left pleural effusion. Aortic Atherosclerosis (ICD10-I70.0). Electronically Signed   By: Corlis Leak M.D.   On: 05/13/2020 15:58    Cardiac Studies   Echo 05/13/20: 1. LVEF Is depressed severe hypokinesis of the mid/distal inferior,  inferoseptal walls.  Left ventricular ejection fraction, by estimation,  is 45 to 50%. The left ventricle has mildly decreased function. There is  mild left ventricular hypertrophy. Left  ventricular diastolic parameters are consistent with Grade I diastolic  dysfunction (impaired relaxation).  2. Right ventricular systolic function is normal. The right ventricular  size is normal. There is normal pulmonary artery systolic pressure.  3. The mitral valve is abnormal. Trivial mitral valve regurgitation.  4. The aortic valve is abnormal. Aortic valve regurgitation is mild. Mild  aortic valve sclerosis is present, with no evidence of aortic valve  stenosis.  5. The inferior vena cava is normal in size with greater than 50%  respiratory variability, suggesting right atrial pressure of 3 mmHg.   LHC 05/26/2011: Patent LIMA-->LAD-->diagonal Mild ostial RCA stenosis Mild ostial LCX stenosis LAD 100% occluded LVEF 60%  Patient Profile     Ms. Lacson is an 64F with CAD s/p CABG, dementia, hiatial hernia, hypertension, and hyperlipidemia admitted with nausea, vomiting, chest and back pain.  Cardiology consulted for NSTEMI.  Assessment & Plan    # NSTEMI:  # Hyperlipidemia:  High-sensitivity troponin elevated to 789 and rising.  Her chest pain seems somewhat atypical and not likely angina she had prior to requiring CABG.  Echo this admission reveals reduction in her LVEF  from 60% in 2013 when it was last assessed to 45 to 50% now.  It is quite possible that she has some ischemia.  However given her functional capacity, age, and  comorbidities, and no plans for cardiac catheterization at this time.  She is in agreement with a more conservative medical strategy.  IV heparin was turned off today after 24 hours.  Continue with aspirin and add clopidogrel 75 mg daily.she has a history of stomach upset with aspirin.  Continue PPI.  Continue amlodipine, metoprolol, Imdur, and rosuvastatin.  Check fasting lipids.  It seems that her pancreatic cyst may be contributing to her abdominal discomfort, but will defer to primary team on that assessment.  # Hypertension: BP remains uncontrolled.  Increase amlodipine to 10mg .       For questions or updates, please contact [facility] Please consult [email or web address] for contact info under        Signed, , Rowland  05/15/2020, 9:13 AM

## 2020-05-16 LAB — CBC
HCT: 31.3 % — ABNORMAL LOW (ref 36.0–46.0)
Hemoglobin: 10.3 g/dL — ABNORMAL LOW (ref 12.0–15.0)
MCH: 30.7 pg (ref 26.0–34.0)
MCHC: 32.9 g/dL (ref 30.0–36.0)
MCV: 93.2 fL (ref 80.0–100.0)
Platelets: 205 10*3/uL (ref 150–400)
RBC: 3.36 MIL/uL — ABNORMAL LOW (ref 3.87–5.11)
RDW: 13.2 % (ref 11.5–15.5)
WBC: 5.5 10*3/uL (ref 4.0–10.5)
nRBC: 0 % (ref 0.0–0.2)

## 2020-05-16 LAB — LIPID PANEL
Cholesterol: 141 mg/dL (ref 0–200)
HDL: 63 mg/dL (ref >40)
LDL Cholesterol: 58 mg/dL (ref 0–99)
Total CHOL/HDL Ratio: 2.2 RATIO
Triglycerides: 99 mg/dL (ref <150)
VLDL: 20 mg/dL (ref 0–40)

## 2020-05-16 LAB — GLUCOSE, CAPILLARY
Glucose-Capillary: 118 mg/dL — ABNORMAL HIGH (ref 70–99)
Glucose-Capillary: 116 mg/dL — ABNORMAL HIGH (ref 70–99)
Glucose-Capillary: 110 mg/dL — ABNORMAL HIGH (ref 70–99)
Glucose-Capillary: 98 mg/dL (ref 70–99)

## 2020-05-16 MED ORDER — AMLODIPINE BESYLATE 10 MG PO TABS: 10.0000 mg | ORAL_TABLET | Freq: Every day | ORAL | 0 refills | Status: DC

## 2020-05-16 MED ORDER — NITROGLYCERIN 0.4 MG SL SUBL: 0.4000 mg | SUBLINGUAL_TABLET | SUBLINGUAL | 0 refills | Status: DC | PRN

## 2020-05-16 MED ORDER — METOPROLOL SUCCINATE ER 25 MG PO TB24: 12.5000 mg | ORAL_TABLET | Freq: Every day | ORAL | Status: DC

## 2020-05-16 MED ORDER — LISINOPRIL 20 MG PO TABS
20.0000 mg | ORAL_TABLET | Freq: Every day | ORAL | 0 refills | Status: DC
Start: 2020-05-17 — End: 2020-06-24

## 2020-05-16 MED ORDER — BETHANECHOL CHLORIDE 5 MG PO TABS: 5.0000 mg | ORAL_TABLET | Freq: Three times a day (TID) | ORAL | 0 refills | Status: AC

## 2020-05-16 MED ORDER — ISOSORBIDE MONONITRATE ER 30 MG PO TB24: 30.0000 mg | ORAL_TABLET | Freq: Every day | ORAL | 0 refills | Status: DC

## 2020-05-16 MED ORDER — ROSUVASTATIN CALCIUM 20 MG PO TABS: 20.0000 mg | ORAL_TABLET | Freq: Every day | ORAL | 0 refills | Status: DC

## 2020-05-16 MED ORDER — CLOPIDOGREL BISULFATE 75 MG PO TABS: 75.0000 mg | ORAL_TABLET | Freq: Every day | ORAL | 0 refills | Status: DC

## 2020-05-16 MED ORDER — SIMETHICONE 80 MG PO CHEW: 80.0000 mg | CHEWABLE_TABLET | Freq: Four times a day (QID) | ORAL | 0 refills | Status: DC | PRN

## 2020-05-16 MED ORDER — SIMETHICONE 80 MG PO CHEW
80.0000 mg | CHEWABLE_TABLET | Freq: Four times a day (QID) | ORAL | Status: DC
  Administered 2020-05-16: 80 mg via ORAL
  Filled 2020-05-16: qty 1

## 2020-05-16 NOTE — Care Management Important Message (Signed)
Important Message  Patient Details  Name: Gloria Rowland MRN: [id number] Date of Birth: [birth year]   Medicare Important Message Given:  Yes     Dorena Bodo 05/16/2020, 2:36 PM

## 2020-05-16 NOTE — Discharge Instructions (Signed)

## 2020-05-16 NOTE — Progress Notes (Signed)
Progress Note  Patient Name: Gloria Rowland Date of Encounter: 05/16/2020  [facility] Cardiologist: Chilton Si, MD   Subjective   Complains of abdominal discomfort and distention.  Denies chest pain or shortness of breath.  Inpatient Medications    Scheduled Meds: . amLODipine  10 mg Oral Daily  . aspirin EC  81 mg Oral Daily  . bethanechol  5 mg Oral TID  . citalopram  40 mg Oral Daily  . clopidogrel  75 mg Oral Daily  . gemfibrozil  300 mg Oral BID AC  . insulin aspart  0-6 Units Subcutaneous Q4H  . isosorbide mononitrate  30 mg Oral Daily  . lisinopril  20 mg Oral Daily  . memantine  10 mg Oral BID  . metoprolol succinate  12.5 mg Oral Daily  . pantoprazole  40 mg Oral Daily  . polyethylene glycol  17 g Oral Daily  . rosuvastatin  20 mg Oral Daily   Continuous Infusions:  PRN Meds: acetaminophen, alum & mag hydroxide-simeth, HYDROmorphone HCl, nitroGLYCERIN, ondansetron (ZOFRAN) IV, promethazine   Vital Signs    Vitals:   05/15/20 1927 05/15/20 2342 05/16/20 0310 05/16/20 0755  BP: 130/61 139/65 124/64 (!) 146/66  Pulse: (!) 52 (!) 50 (!) 44 (!) 49  Resp: 20 18 16 19   Temp: 98.2 F (36.8 C) 98.4 F (36.9 C) 98.1 F (36.7 C) 98 F (36.7 C)  TempSrc: Oral Oral Oral Oral  SpO2: 97% 98% 97%   Weight:      Height:        Intake/Output Summary (Last 24 hours) at 05/16/2020 0941 Last data filed at 05/16/2020 0316 Gross per 24 hour  Intake -  Output 401 ml  Net -401 ml   Last 3 Weights 05/13/2020 05/12/2020 04/10/2018  Weight (lbs) 116 lb 10 oz 113 lb 15.7 oz 113 lb 15.7 oz  Weight (kg) 52.9 kg 51.7 kg 51.7 kg      Telemetry    Sinus bradycardia.  Heart rate in the 40s- Personally Reviewed  ECG    Sinus rhythm.  Rate 62 bpm.  PACs.  - Personally Reviewed  Physical Exam   VS:  BP (!) 146/66 (BP Location: Left Arm)   Pulse (!) 49   Temp 98 F (36.7 C) (Oral)   Resp 19   Ht 5\' 4"  (1.626 m)   Wt 52.9 kg   SpO2 97%   BMI 20.02 kg/m  ,  BMI Body mass index is 20.02 kg/m. GENERAL:  Frail.  No acute distress.  HEENT: Pupils equal round and reactive, fundi not visualized, oral mucosa unremarkable NECK:  No jugular venous distention, waveform within normal limits, carotid upstroke brisk and symmetric LUNGS:  Clear to auscultation bilaterally HEART: Bradycardic.  Regular rhythm.04/12/2018  PMI not displaced or sustained,S1 and S2 within normal limits, no S3, no S4, no clicks, no rubs, no murmurs CHEST: +chest wall tenderness to palpation ABD: Suprapubic disc tension and tenderness.  Positive bowel sounds normal in frequency in pitch, no bruits, no rebound, no guarding, no midline pulsatile mass, no hepatomegaly, no splenomegaly EXT:  2 plus pulses throughout, no edema, no cyanosis no clubbing SKIN:  No rashes no nodules NEURO:  Cranial nerves II through XII grossly intact, motor grossly intact throughout PSYCH:  Cognitively intact, oriented to person place and time   Labs    High Sensitivity Troponin:   Recent Labs  Lab 05/12/20 2130 05/12/20 2328 05/13/20 0257 05/13/20 0520  TROPONINIHS 31* 169* 532* 789*  Chemistry Recent Labs  Lab 05/12/20 2203 05/13/20 0248 05/13/20 0257 05/14/20 0549 05/15/20 0027  NA  --    < > 139 142 139  K  --    < > 3.5 3.8 3.7  CL  --   --  105 111 110  CO2  --   --  19* 24 20*  GLUCOSE  --   --  168* 105* 118*  BUN  --   --  13 9 9   CREATININE  --   --  0.91 1.02* 0.92  CALCIUM  --   --  8.5* 8.3* 8.4*  PROT 6.4*  --   --   --   --   ALBUMIN 3.8  --   --   --   --   AST 36  --   --   --   --   ALT 15  --   --   --   --   ALKPHOS 56  --   --   --   --   BILITOT 0.9  --   --   --   --   GFRNONAA  --   --  >60 54* >60  ANIONGAP  --   --  15 7 9    < > = values in this interval not displayed.     Hematology Recent Labs  Lab 05/14/20 0549 05/15/20 0027 05/16/20 0111  WBC 6.0 6.7 5.5  RBC 3.04* 3.06* 3.36*  HGB 9.5* 9.7* 10.3*  HCT 28.4* 28.9* 31.3*  MCV 93.4 94.4 93.2   MCH 31.3 31.7 30.7  MCHC 33.5 33.6 32.9  RDW 13.6 13.4 13.2  PLT 189 194 205    BNPNo results for input(s): BNP, PROBNP in the last 168 hours.   DDimer No results for input(s): DDIMER in the last 168 hours.   Radiology    No results found.  Cardiac Studies   Echo 05/13/20: 1. LVEF Is depressed severe hypokinesis of the mid/distal inferior,  inferoseptal walls.  Left ventricular ejection fraction, by estimation,  is 45 to 50%. The left ventricle has mildly decreased function. There is  mild left ventricular hypertrophy. Left  ventricular diastolic parameters are consistent with Grade I diastolic  dysfunction (impaired relaxation).  2. Right ventricular systolic function is normal. The right ventricular  size is normal. There is normal pulmonary artery systolic pressure.  3. The mitral valve is abnormal. Trivial mitral valve regurgitation.  4. The aortic valve is abnormal. Aortic valve regurgitation is mild. Mild  aortic valve sclerosis is present, with no evidence of aortic valve  stenosis.  5. The inferior vena cava is normal in size with greater than 50%  respiratory variability, suggesting right atrial pressure of 3 mmHg.   LHC 05/26/2011: Patent LIMA-->LAD-->diagonal Mild ostial RCA stenosis Mild ostial LCX stenosis LAD 100% occluded LVEF 60%  Patient Profile     Gloria Rowland is an 18F with CAD s/p CABG, dementia, hiatial hernia, hypertension, and hyperlipidemia admitted with nausea, vomiting, chest and back pain.  Cardiology consulted for NSTEMI.  Assessment & Plan    # NSTEMI:  # Hyperlipidemia:  High-sensitivity troponin elevated to 789 and rising.  Her chest pain seems somewhat atypical and not likely angina she had prior to requiring CABG.  Echo this admission reveals reduction in her LVEF  from 60% in 2013 when it was last assessed to 45 to 50% now.  It is quite possible that she has some ischemia.  However given her functional  capacity, age, and  comorbidities, and no plans for cardiac catheterization at this time.  She is in agreement with a more conservative medical strategy.  She was treated with 48 hours of IV heparin.  Continue with aspirin and clopidogrel 75 mg daily.  Continue clopidogrel for 1 year for medical management of NSTEMI.  She has a history of stomach upset with aspirin.  Continue PPI.  Continue amlodipine, Imdur, and rosuvastatin.  We will stop metoprolol due to bradycardia.  Risk of falls outweighs the benefits.   # Hypertension: Continue amlodipine, lisinopril, and Imdur.  # Urine retention: Required in and out cath for a liter of urine yesterday.  She again complains of abdominal bloating and distention.  Have asked the nurse to repeat bladder scan.   [facility] will sign off.   Medication Recommendations: Stop metoprolol Other recommendations (labs, testing, etc): None Follow up as an outpatient: We will arrange follow-up       For questions or updates, please contact [facility] Please consult [email or web address] for contact info under        Signed, Chilton Si, MD  05/16/2020, 9:41 AM

## 2020-05-16 NOTE — Evaluation (Signed)
Occupational Therapy Evaluation Patient Details Name: Gloria Rowland MRN: [id number] DOB: 05/07/1932 Today's Date: 05/16/2020    History of Present Illness Pt is an 87 year old woman admitted with N/V and chest pain due to NSTEMI. PMH: CAD, CABG, HTN, pancreatice head cyst, HLD, dementia, CKDIII, anxiety, depression.   Clinical Impression   Pt is a poor historian, reports walking with a RW and being independent in self care prior to admission. She and her husband were needing more assistance for IADL. Pt presents with generalized weakness and poor standing balance. She requires up to mod assist for mobility and set up to total assist for ADL. Pt's daughter plans to take pt home to her house with palliative care services. Recommending HHOT. Will follow acutely.    Follow Up Recommendations  Home health OT;Supervision/Assistance - 24 hour    Equipment Recommendations  3 in 1 bedside commode    Recommendations for Other Services       Precautions / Restrictions Precautions Precautions: Fall Precaution Comments: urinary incontinence      Mobility Bed Mobility Overal bed mobility: Needs Assistance Bed Mobility: Supine to Sit     Supine to sit: Supervision     General bed mobility comments: increased time, use of rail, assist for lines    Transfers Overall transfer level: Needs assistance Equipment used: Rolling walker (2 wheeled) Transfers: Sit to/from Stand Sit to Stand: Mod assist         General transfer comment: cues for hand placement, assist to rise and steady    Balance Overall balance assessment: Needs assistance   Sitting balance-Leahy Scale: Fair     Standing balance support: Bilateral upper extremity supported Standing balance-Leahy Scale: Poor Standing balance comment: B UE support of walker and external assist                           ADL either performed or assessed with clinical judgement   ADL Overall ADL's : Needs  assistance/impaired Eating/Feeding: Set up;Sitting   Grooming: Brushing hair;Sitting;Total assistance   Upper Body Bathing: Moderate assistance;Sitting   Lower Body Bathing: Maximal assistance;Sit to/from stand   Upper Body Dressing : Sitting;Moderate assistance   Lower Body Dressing: Total assistance;Sitting/lateral leans;Sit to/from stand       Toileting- Architect and Hygiene: Total assistance;Sit to/from stand               Vision Baseline Vision/History: Wears glasses Patient Visual Report: No change from baseline       Perception     Praxis      Pertinent Vitals/Pain Pain Assessment: Faces Faces Pain Scale: No hurt     Hand Dominance Right   Extremity/Trunk Assessment Upper Extremity Assessment Upper Extremity Assessment: Generalized weakness   Lower Extremity Assessment Lower Extremity Assessment: Defer to PT evaluation   Cervical / Trunk Assessment Cervical / Trunk Assessment: Kyphotic   Communication Communication Communication: HOH   Cognition Arousal/Alertness: Awake/alert Behavior During Therapy: Anxious Overall Cognitive Status: Impaired/Different from baseline Area of Impairment: Following commands;Memory;Awareness;Problem solving;Orientation                 Orientation Level: Situation   Memory: Decreased short-term memory Following Commands: Follows one step commands with increased time (and multimodal cues)     Problem Solving: Slow processing;Decreased initiation;Difficulty sequencing;Requires verbal cues;Requires tactile cues     General Comments       Exercises     Shoulder Instructions  Home Living Family/patient expects to be discharged to:: Private residence Living Arrangements: Spouse/significant other Available Help at Discharge: Family (plans to go home with daughter) Type of Home: House Home Access: Level entry     Home Layout: One level;Laundry or work area in [employer]: Environmental consultant - 2 wheels   Additional Comments: pt is a poor historian      Prior Functioning/Environment Level of Independence: Needs assistance  Gait / Transfers Assistance Needed: ambulated with walker ADL's / Homemaking Assistance Needed: indepedent in ADL, needing more assist for IADL leading up to admission            OT Problem List: Decreased strength;Decreased activity tolerance;Impaired balance (sitting and/or standing);Decreased knowledge of use of DME or AE;Decreased safety awareness;Decreased cognition      OT Treatment/Interventions: Self-care/ADL training;DME and/or AE instruction;Patient/family education;Balance training;Therapeutic activities    OT Goals(Current goals can be found in the care plan section) Acute Rehab OT Goals Patient Stated Goal: go home with her daughter OT Goal Formulation: With patient Time For Goal Achievement: 05/30/20 Potential to Achieve Goals: Good ADL Goals Pt Will Perform Grooming: with supervision;standing Pt Will Perform Upper Body Dressing: with supervision;sitting Pt Will Perform Lower Body Dressing: with mod assist;sit to/from stand Pt Will Transfer to Toilet: with supervision;ambulating;bedside commode (over toilet) Pt Will Perform Toileting - Clothing Manipulation and hygiene: with supervision;sit to/from stand Additional ADL Goal #1: Pt will perform bed mobility modified independently.  OT Frequency: Min 2X/week   Barriers to D/C:            Co-evaluation PT/OT/SLP Co-Evaluation/Treatment: Yes Reason for Co-Treatment: For patient/therapist safety   OT goals addressed during session: ADL's and self-care      AM-PAC OT "6 Clicks" Daily Activity     Outcome Measure Help from another person eating meals?: A Little Help from another person taking care of personal grooming?: A Little Help from another person toileting, which includes using toliet, bedpan, or urinal?: Total Help from another person bathing  (including washing, rinsing, drying)?: A Lot Help from another person to put on and taking off regular upper body clothing?: A Little Help from another person to put on and taking off regular lower body clothing?: Total 6 Click Score: 13   End of Session Equipment Utilized During Treatment: Gait belt;Rolling walker Nurse Communication: Mobility status;Other (comment) (aware pt urinated once OOB)  Activity Tolerance: Patient tolerated treatment well Patient left: in chair;with call bell/phone within reach;with chair alarm set  OT Visit Diagnosis: Unsteadiness on feet (R26.81);Other abnormalities of gait and mobility (R26.89);Muscle weakness (generalized) (M62.81);Other symptoms and signs involving cognitive function                Time: 8099-8338 OT Time Calculation (min): 37 min Charges:  OT General Charges $OT Visit: 1 Visit OT Evaluation $OT Eval Moderate Complexity: 1 Mod  Martie Round, OTR/L Acute Rehabilitation Services Pager: [phone] Office: [phone]  Evern Bio 05/16/2020, 1:11 PM

## 2020-05-16 NOTE — Evaluation (Signed)
Physical Therapy Evaluation Patient Details Name: Gloria Rowland MRN: [id number] DOB: 02/26/1933 Today's Date: 05/16/2020   History of Present Illness  Pt is an 87 year old woman admitted with N/V and chest pain due to NSTEMI. PMH: CAD, CABG, HTN, pancreatice head cyst, HLD, dementia, CKDIII, anxiety, depression.  Clinical Impression  Pt is poor historian however reports PTA pt living with husband. Pt reports independence with mobility using RW and for self care, relying on daughter for iADLs. Pt states plan is to go home to daughter's home that is all on one level with no steps to enter. Pt limited in safe mobility by decreased standing tolerance, generalized weakness and decreased endurance.Pt is currently supervision for bed mobility, mod A for power up into standing and min A for ambulation of 25 feet with RW. Per notes pt is hoping for discharge home with Palliative Care, PT recommending HHPT as well. PT will continue to follow acutely.     Follow Up Recommendations Home health PT;Other (comment) (family would like Palliative Care)    Equipment Recommendations  None recommended by PT       Precautions / Restrictions Precautions Precautions: Fall Precaution Comments: urinary incontinence      Mobility  Bed Mobility Overal bed mobility: Needs Assistance Bed Mobility: Supine to Sit     Supine to sit: Supervision     General bed mobility comments: increased time, use of rail, assist for lines    Transfers Overall transfer level: Needs assistance Equipment used: Rolling walker (2 wheeled) Transfers: Sit to/from Stand Sit to Stand: Mod assist         General transfer comment: cues for hand placement, assist to rise and steady  Ambulation/Gait Ambulation/Gait assistance: Min assist Gait Distance (Feet): 25 Feet Assistive device: Rolling walker (2 wheeled) Gait Pattern/deviations: Trunk flexed;Narrow base of support;Shuffle;Step-through pattern Gait velocity:  slowed Gait velocity interpretation: <1.31 ft/sec, indicative of household ambulator General Gait Details: min A for steadying with slow, mildly unsteady gait, vc for upright posture and sequencing        Balance Overall balance assessment: Needs assistance   Sitting balance-Leahy Scale: Fair     Standing balance support: Bilateral upper extremity supported Standing balance-Leahy Scale: Poor Standing balance comment: B UE support of walker and external assist                             Pertinent Vitals/Pain Pain Assessment: Faces Faces Pain Scale: No hurt    Home Living Family/patient expects to be discharged to:: Private residence Living Arrangements: Spouse/significant other Available Help at Discharge: Family (plans to go home with daughter) Type of Home: House Home Access: Level entry     Home Layout: One level;Laundry or work area in [employer] Equipment: Environmental consultant - 2 wheels Additional Comments: pt is a poor historian    Prior Function Level of Independence: Needs assistance   Gait / Transfers Assistance Needed: ambulated with walker  ADL's / Homemaking Assistance Needed: indepedent in ADL, needing more assist for IADL leading up to admission        Hand Dominance   Dominant Hand: Right    Extremity/Trunk Assessment   Upper Extremity Assessment Upper Extremity Assessment: Defer to OT evaluation    Lower Extremity Assessment Lower Extremity Assessment: Generalized weakness    Cervical / Trunk Assessment Cervical / Trunk Assessment: Kyphotic  Communication   Communication: HOH  Cognition Arousal/Alertness: Awake/alert Behavior During Therapy: Anxious Overall Cognitive  Status: Impaired/Different from baseline Area of Impairment: Following commands;Memory;Awareness;Problem solving;Orientation                 Orientation Level: Situation   Memory: Decreased short-term memory Following Commands: Follows one step commands with  increased time (and multimodal cues)     Problem Solving: Slow processing;Decreased initiation;Difficulty sequencing;Requires verbal cues;Requires tactile cues        General Comments General comments (skin integrity, edema, etc.): HR in 50-60 on RA        Assessment/Plan    PT Assessment Patient needs continued PT services  PT Problem List Decreased strength;Decreased activity tolerance;Decreased balance;Decreased mobility;Decreased cognition;Decreased coordination;Decreased safety awareness       PT Treatment Interventions DME instruction;Gait training;Functional mobility training;Therapeutic activities;Therapeutic exercise;Balance training;Cognitive remediation;Patient/family education    PT Goals (Current goals can be found in the Care Plan section)  Acute Rehab PT Goals Patient Stated Goal: go home with her daughter PT Goal Formulation: With patient Time For Goal Achievement: 05/30/20 Potential to Achieve Goals: Fair    Frequency Min 3X/week   Barriers to discharge        Co-evaluation PT/OT/SLP Co-Evaluation/Treatment: Yes Reason for Co-Treatment: For patient/therapist safety PT goals addressed during session: Mobility/safety with mobility OT goals addressed during session: ADL's and self-care       AM-PAC PT "6 Clicks" Mobility  Outcome Measure Help needed turning from your back to your side while in a flat bed without using bedrails?: None Help needed moving from lying on your back to sitting on the side of a flat bed without using bedrails?: A Little Help needed moving to and from a bed to a chair (including a wheelchair)?: A Lot Help needed standing up from a chair using your arms (e.g., wheelchair or bedside chair)?: A Lot Help needed to walk in hospital room?: A Little Help needed climbing 3-5 steps with a railing? : A Lot 6 Click Score: 16    End of Session Equipment Utilized During Treatment: Gait belt Activity Tolerance: Patient tolerated  treatment well Patient left: in chair;with call bell/phone within reach;with chair alarm set Nurse Communication: Mobility status;Other (comment) (need for Purewick) PT Visit Diagnosis: Unsteadiness on feet (R26.81);Muscle weakness (generalized) (M62.81);Difficulty in walking, not elsewhere classified (R26.2)    Time: 2831-5176 PT Time Calculation (min) (ACUTE ONLY): 38 min   Charges:   PT Evaluation $PT Eval Moderate Complexity: 1 Mod          Devarius Nelles B. Beverely Risen PT, DPT Acute Rehabilitation Services Pager [phone] Office [phone]   Elon Alas Fleet 05/16/2020, 2:23 PM

## 2020-05-16 NOTE — Discharge Summary (Addendum)
Discharge Summary  Gloria Rowland BSW:[id number] DOB: 26-Aug-1932  PCP: Ignatius Specking, MD  Admit date: 05/12/2020 Discharge date: 05/16/2020  Time spent: 35 minutes.  Recommendations for Outpatient Follow-up:  1. Follow-up with urology for a voiding trial in 1 week. 2. Follow-up with cardiology in 1 to 2 weeks. 3. Follow-up with your primary care provider in 1 to 2 weeks. 4. Follow up with palliative care medicine in 1 to 2 weeks. 5. Take your medications as prescribed. 6. Continue PT OT with assistance and fall precautions.  Discharge Diagnoses:  Active Hospital Problems   Diagnosis Date Noted  . NSTEMI (non-ST elevated myocardial infarction) (HCC) 05/13/2020  . Palliative care encounter   . Hypokalemia 05/13/2020  . Chronic kidney disease, stage 3a (HCC) 05/13/2020  . Hyperglycemia 05/13/2020  . Severe protein-calorie malnutrition (HCC) 05/13/2020  . Normocytic anemia 05/13/2020  . Dementia without behavioral disturbance (HCC) 04/10/2018  . Intractable nausea and vomiting 04/10/2018  . Hypertension     Resolved Hospital Problems  No resolved problems to display.    Discharge Condition: Stable.  Diet recommendation: Resume previous diet.  Vitals:   05/16/20 1011 05/16/20 1050  BP: 133/69 136/78  Pulse:  (!) 55  Resp:  18  Temp:  (!) 97.4 F (36.3 C)  SpO2:      History of present illness:  Gloria Rowland a 87 y.o.femalewith medical history significant forCAD status post CABG in 1998, hypertension, pancreatic head cyst, hyperlipidemia, and dementia, now presenting to the emergency department for evaluation of chest pain, nausea, and vomiting, work-up suggested NSTEMI and she was admitted to the hospital for further work-up.  Hospital course complicated by acute urinary retention for which a Foley catheter was inserted after repeated in and out caths.  Discussed with urology Dr. Benancio Deeds who recommended to continue bethanechol and keep the Foley catheter in  place for 1 week, and to follow-up with urology outpatient for a voiding trial.  05/16/20: Seen and examined at her bedside.  She reports abdominal distention prior to bladder scan which revealed acute urinary retention.  Foley catheter ordered to be placed.  She was seen by cardiology, okay to discharge from a cardiac standpoint.  Hospital Course:  Principal Problem:   NSTEMI (non-ST elevated myocardial infarction) (HCC) Active Problems:   Hypertension   Intractable nausea and vomiting   Dementia without behavioral disturbance (HCC)   Hypokalemia   Chronic kidney disease, stage 3a (HCC)   Hyperglycemia   Severe protein-calorie malnutrition (HCC)   Normocytic anemia   Palliative care encounter  NSTEMI  Presented with chest pain.   Seen by cardiology with recommendation for conservative management. Not a candidate for invasive testing or procedures. Continue aspirin 81 mg daily, Plavix 75 mg daily (x 1 year for medical management of NSTEMI), gemfibrozil 300 mg twice daily, Imdur 30 mg daily, lisinopril 20 mg daily, Crestor 20 mg daily.  She is not a candidate for invasive testing or procedures. She denies any anginal symptoms this morning.  Sinus bradycardia Heart rate in the 50s Metoprolol stopped on 05/16/2020. Follow-up with cardiology.  Essential hypertension BP is at goal 102/71, heart rate 51, respiratory 18, O2 saturation 96% on room air. Continue the following medications as recommended by cardiology: Amlodipine 10 mg daily, lisinopril 20 mg daily, Imdur 30 mg daily.  Acute urinary retention Had repeated in and out caths Bethanechol started on 05/15/2020. Discussed with urology Dr. Benancio Deeds via phone on 05/16/2020, recommended placing a Foley catheter and keeping x1 week.  Follow-up with urology outpatient for voiding trial.  Essential hypertension BP is not at goal Increased dose of amlodipine to 10 mg daily.  Hyperlipidemia LDL at goal 58, less than 70. Continue  gemfibrozil and Crestor.  Chronic anxiety/depression Continue home Celexa  History of pancreatic head cyst, slightly grown in size. Outpatient MRCP is recommended  Physical debility PT recommending home health PT OT. Continue fall precautions.  GERD Continue home PPI.  Goals of care She is DNR Follow-up with palliative care medicine outpatient.    Code Status: DNR.  Family Communication:  Updated her daughter via phone on 05/15/2020.    Consultants:  Cardiology  Palliative care medicine.  Curb sided on the phone with Dr. Benancio Deeds, urology, on 05/16/2020.  Procedures:  2D echo  Antimicrobials:  None   Discharge Exam: BP 136/78 (BP Location: Left Arm)   Pulse (!) 55   Temp (!) 97.4 F (36.3 C) (Oral)   Resp 18   Ht 5\' 4"  (1.626 m)   Wt 52.9 kg   SpO2 97%   BMI 20.02 kg/m  . General: 87 y.o. year-old female well developed well nourished in no acute distress.  Alert, pleasant and interactive. . Cardiovascular: Bradycardic with no rubs or gallops.   88 Respiratory: Clear to auscultation with no wheezes or rales. Good inspiratory effort. . Musculoskeletal: No lower extremity edema bilaterally. . Skin: No ulcerative lesions noted or rashes, . Psychiatry: Mood is appropriate for condition and setting  Discharge Instructions You were cared for by a hospitalist during your hospital stay. If you have any questions about your discharge medications or the care you received while you were in the hospital after you are discharged, you can call the unit and asked to speak with the hospitalist on call if the hospitalist that took care of you is not available. Once you are discharged, your primary care physician will handle any further medical issues. Please note that NO REFILLS for any discharge medications will be authorized once you are discharged, as it is imperative that you return to your primary care physician (or establish a relationship with a primary  care physician if you do not have one) for your aftercare needs so that they can reassess your need for medications and monitor your lab values.   Allergies as of 05/16/2020      Reactions   Aspirin Nausea And Vomiting   Upset stomach   Codeine Nausea And Vomiting   Upset stomach      Medication List    STOP taking these medications   nitrofurantoin 100 MG capsule Commonly known as: MACRODANTIN   risperiDONE 0.5 MG tablet Commonly known as: RISPERDAL     TAKE these medications   acetaminophen 650 MG CR tablet Commonly known as: TYLENOL Take 650 mg by mouth every 6 (six) hours as needed for pain.   amLODipine 10 MG tablet Commonly known as: NORVASC Take 1 tablet (10 mg total) by mouth daily. Start taking on: May 17, 2020 What changed:   medication strength  how much to take   aspirin EC 81 MG tablet Take 81 mg by mouth daily.   bethanechol 5 MG tablet Commonly known as: URECHOLINE Take 1 tablet (5 mg total) by mouth 3 (three) times daily for 10 days.   citalopram 40 MG tablet Commonly known as: CELEXA Take 40 mg by mouth daily.   clopidogrel 75 MG tablet Commonly known as: PLAVIX Take 1 tablet (75 mg total) by mouth daily. Start taking on: May 17, 2020   Dextromethorphan-guaiFENesin 5-100 MG/5ML Liqd Give 10 ml as needed for cough every 4 hours prn   gemfibrozil 600 MG tablet Commonly known as: LOPID Take 300 mg by mouth 2 (two) times daily before a meal.   isosorbide mononitrate 30 MG 24 hr tablet Commonly known as: IMDUR Take 1 tablet (30 mg total) by mouth daily. Start taking on: May 17, 2020   lisinopril 20 MG tablet Commonly known as: ZESTRIL Take 1 tablet (20 mg total) by mouth daily. Start taking on: May 17, 2020 What changed: when to take this   memantine 10 MG tablet Commonly known as: NAMENDA Take 10 mg by mouth 2 (two) times daily.   nitroGLYCERIN 0.4 MG SL tablet Commonly known as: NITROSTAT Place 1 tablet (0.4  mg total) under the tongue every 5 (five) minutes x 3 doses as needed for up to 10 days for chest pain.   ondansetron 4 MG disintegrating tablet Commonly known as: Zofran ODT Take 1 tablet (4 mg total) by mouth every 8 (eight) hours as needed for nausea or vomiting.   pantoprazole 40 MG tablet Commonly known as: PROTONIX Take 1 tablet (40 mg total) by mouth daily.   polyethylene glycol powder 17 GM/SCOOP powder Commonly known as: GLYCOLAX/MIRALAX Take 17 g by mouth daily. For constipation.   rosuvastatin 20 MG tablet Commonly known as: CRESTOR Take 1 tablet (20 mg total) by mouth daily. Start taking on: May 17, 2020 What changed:   medication strength  how much to take   simethicone 80 MG chewable tablet Commonly known as: MYLICON Chew 1 tablet (80 mg total) by mouth 4 (four) times daily as needed for flatulence.   Vitamin D (Ergocalciferol) 1.25 MG (50000 UNIT) Caps capsule Commonly known as: DRISDOL Take 50,000 Units by mouth every 7 (seven) days.            Durable Medical Equipment  (From admission, onward)         Start     Ordered   05/16/20 1531  For home use only DME lightweight manual wheelchair with seat cushion  Once       Comments: Patient suffers from weakness which impairs their ability to perform daily activities like bathing and dressing in the home.  A walker or cane will not resolve  issue with performing activities of daily living. A wheelchair will allow patient to safely perform daily activities. Patient is not able to propel themselves in the home using a standard weight wheelchair due to weakness. Patient can self propel in the lightweight wheelchair. Length of need lifetime. Accessories: elevating leg rests (ELRs), wheel locks, extensions and anti-tippers.  Wants a narrow wheelchair.   05/16/20 1531         Allergies  Allergen Reactions  . Aspirin Nausea And Vomiting    Upset stomach  . Codeine Nausea And Vomiting    Upset  stomach    Follow-up Information    Vyas, Dhruv B, MD. Call in 1 day(s).   Specialty: Internal Medicine Why: Please call for post hospital follow-up appointment and for voiding trial. Contact information: [address] [place] [address] [phone]        Chilton Siandolph, Tiffany, MD .   Specialty: Cardiology Contact information: [address] [place] [address] [phone]        [facility].   Specialty: Home Health Services Why: HHRN,HHPT,HHAIDE, SW Contact information: [address] [place] [address]  [phone]        [place] Follow up.   Why: out palliative services Contact information: [address] [place] [address] [phone]        [facility] Follow up.   Why: wheelchair Contact information: [address] [place] [address] [phone]                The results of significant diagnostics from this hospitalization (including imaging, microbiology, ancillary and laboratory) are listed below for reference.    Significant Diagnostic Studies: DG Chest 2 View  Result Date: 05/12/2020 CLINICAL DATA:  Substernal chest pain. EXAM: CHEST - 2 VIEW COMPARISON:  May 01, 2017 FINDINGS: Multiple sternal wires are noted. The lungs are mildly hyperinflated. There is no evidence of acute infiltrate, pleural effusion or pneumothorax. The cardiac silhouette is mildly enlarged. There is moderate severity calcification of the thoracic aorta. Degenerative changes seen throughout the thoracic spine. IMPRESSION: Stable exam without active cardiopulmonary disease. Electronically Signed   By: Aram Candela M.D.   On: 05/12/2020 22:17   ECHOCARDIOGRAM COMPLETE  Result Date: 05/13/2020    ECHOCARDIOGRAM REPORT   Patient Name:   HUMNA MOOREHOUSE Date of Exam: 05/13/2020 Medical Rec #:  [id number]        Height:       64.0 in Accession #:    [id number]       Weight:        114.0 lb Date of Birth:  1932-12-04        BSA:          1.541 m Patient Age:    88 years         BP:           128/79 mmHg Patient Gender: F                HR:           56 bpm. Exam Location:  Inpatient Procedure: Cardiac Doppler, Color Doppler and Strain Analysis STAT ECHO Indications:    Elevated Troponin  History:        Patient has no prior history of Echocardiogram examinations.                 CAD, Prior CABG; Risk Factors:Hypertension and Dyslipidemia.  Sonographer:    Leta Jungling RDCS Referring Phys: [phone] TIFFANY Ashaway IMPRESSIONS  1. LVEF Is depressed severe hypokinesis of the mid/distal inferior, inferoseptal walls. . Left ventricular ejection fraction, by estimation, is 45 to 50%. The left ventricle has mildly decreased function. There is mild left ventricular hypertrophy. Left  ventricular diastolic parameters are consistent with Grade I diastolic dysfunction (impaired relaxation).  2. Right ventricular systolic function is normal. The right ventricular size is normal. There is normal pulmonary artery systolic pressure.  3. The mitral valve is abnormal. Trivial mitral valve regurgitation.  4. The aortic valve is abnormal. Aortic valve regurgitation is mild. Mild aortic valve sclerosis is present, with no evidence of aortic valve stenosis.  5. The inferior vena cava is normal in size with greater than 50% respiratory variability, suggesting right atrial pressure of 3 mmHg. FINDINGS  Left Ventricle: LVEF Is depressed severe hypokinesis of the mid/distal inferior, inferoseptal walls. Left ventricular ejection fraction, by estimation, is 45 to 50%. The left ventricle has mildly decreased function. The left ventricular internal cavity size was normal in size. There is mild left ventricular hypertrophy. Left ventricular diastolic parameters are consistent with Grade I diastolic dysfunction (  impaired relaxation). Right Ventricle: The right ventricular size is normal. No increase in right ventricular  wall thickness. Right ventricular systolic function is normal. There is normal pulmonary artery systolic pressure. The tricuspid regurgitant velocity is 2.37 m/s, and  with an assumed right atrial pressure of 3 mmHg, the estimated right ventricular systolic pressure is 25.5 mmHg. Left Atrium: Left atrial size was normal in size. Right Atrium: Right atrial size was normal in size. Pericardium: There is no evidence of pericardial effusion. Mitral Valve: The mitral valve is abnormal. There is mild thickening of the mitral valve leaflet(s). Mild to moderate mitral annular calcification. Trivial mitral valve regurgitation. Tricuspid Valve: The tricuspid valve is normal in structure. Tricuspid valve regurgitation is trivial. Aortic Valve: The aortic valve is abnormal. Aortic valve regurgitation is mild. Mild aortic valve sclerosis is present, with no evidence of aortic valve stenosis. Pulmonic Valve: The pulmonic valve was normal in structure. Pulmonic valve regurgitation is not visualized. Aorta: The aortic root is normal in size and structure. Venous: The inferior vena cava is normal in size with greater than 50% respiratory variability, suggesting right atrial pressure of 3 mmHg. IAS/Shunts: The interatrial septum was not assessed.  LEFT VENTRICLE PLAX 2D LVIDd:         4.10 cm  Diastology LVIDs:         2.90 cm  LV e' medial:    4.80 cm/s LV PW:         1.20 cm  LV E/e' medial:  18.9 LV IVS:        1.50 cm  LV e' lateral:   5.25 cm/s LVOT diam:     1.80 cm  LV E/e' lateral: 17.3 LV SV:         51 LV SV Index:   33 LVOT Area:     2.54 cm                          3D Volume EF:                         3D EF:        55 %                         LV EDV:       110 ml                         LV ESV:       49 ml                         LV SV:        61 ml RIGHT VENTRICLE RV S prime:     10.40 cm/s TAPSE (M-mode): 2.1 cm LEFT ATRIUM             Index       RIGHT ATRIUM           Index LA diam:        3.80 cm 2.47 cm/m  RA  Area:     16.40 cm LA Vol (A2C):   55.1 ml 35.77 ml/m RA Volume:   45.90 ml  29.80 ml/m LA Vol (A4C):   42.6 ml 27.65 ml/m LA Biplane Vol: 50.2 ml 32.59 ml/m  AORTIC VALVE LVOT Vmax:   80.00 cm/s LVOT Vmean:  62.100 cm/s LVOT VTI:    0.199 m  AORTA Ao Root diam: 3.10 cm Ao Asc diam:  3.30 cm MITRAL VALVE                TRICUSPID VALVE MV Area (PHT): 2.56 cm     TR Peak grad:   22.5 mmHg MV Decel Time: 296 msec     TR Vmax:        237.00 cm/s MV E velocity: 90.80 cm/s MV A velocity: 136.00 cm/s  SHUNTS MV E/A ratio:  0.67         Systemic VTI:  0.20 m                             Systemic Diam: 1.80 cm Dietrich Pates MD Electronically signed by Dietrich Pates MD Signature Date/Time: 05/13/2020/2:09:12 PM    Final    CT Angio Abd/Pel w/ and/or w/o  Result Date: 05/13/2020 CLINICAL DATA:  Epigastric pain, vomiting EXAM: CTA ABDOMEN AND PELVIS WITH CONTRAST TECHNIQUE: Multidetector CT imaging of the abdomen and pelvis was performed using the standard protocol during bolus administration of intravenous contrast. Multiplanar reconstructed images and MIPs were obtained and reviewed to evaluate the vascular anatomy. CONTRAST:  OMNIPAQUE IOHEXOL 350 MG/ML SOLN COMPARISON:  04/09/2018 FINDINGS: VASCULAR Aorta: Moderate calcified atheromatous plaque. No aneurysm, dissection, or stenosis. Celiac: Partially calcified ostial plaque with short segment mild stenosis, patent distally. SMA: Partially calcified ostial plaque resulting in short segment mild stenosis, patent distally with classic branch anatomy. Renals: Both renal arteries are patent without evidence of aneurysm, dissection, vasculitis, fibromuscular dysplasia or significant stenosis. IMA: Patent without evidence of aneurysm, dissection, vasculitis or significant stenosis. Inflow: Scattered calcified plaque involving bilateral common and internal iliac arteries. No aneurysm, dissection, or stenosis. Proximal Outflow: Bilateral common femoral and visualized  portions of the superficial and profunda femoral arteries are patent without evidence of aneurysm, dissection, vasculitis or significant stenosis. Veins: Patent patent veins, portal vein, renal veins. Iliac venous system and IVC unremarkable. No venous pathology identified. Review of the MIP images confirms the above findings. NON-VASCULAR Lower chest: Previous median sternotomy. Trace left pleural effusion. Minimal dependent atelectasis in the lung bases. Hepatobiliary: Cholecystectomy clips. Mild central intrahepatic biliary ductal dilatation and ectasias of the CBD as before. No new liver lesion. Pancreas: 2.6 cm cystic pancreatic head lesion, previously 1.8 cm. No ductal dilatation. Spleen: Normal in size without focal abnormality. Adrenals/Urinary Tract: Adrenal glands unremarkable. Chronic left UPJ obstruction. Ureters decompressed. Moderate distension of the urinary bladder. Stomach/Bowel: Stomach is nondistended. Small bowel decompressed. Appendix not discretely identified. The colon is nondilated with a few scattered sigmoid diverticula; no adjacent inflammatory change. Lymphatic: No abdominal or pelvic adenopathy. Reproductive: Status post hysterectomy. No adnexal masses. Other: No ascites.  No free air. Musculoskeletal: Thoracolumbar levoscoliosis apex L2 with multilevel lumbar spondylitic change. No fracture or worrisome bone lesion. IMPRESSION: 1. No significant proximal mesenteric arterial occlusive disease to suggest etiology of abdominal pain. 2. 2.6 cm cystic pancreatic head lesion, previously 1.8 cm. Consider elective outpatient MR/MRCP for further characterization. 3. Chronic left UPJ obstruction. 4. Trace left pleural effusion. Aortic Atherosclerosis (ICD10-I70.0). Electronically Signed   By: Corlis Leak M.D.   On: 05/13/2020 15:58    Microbiology: Recent Results (from the past 240 hour(s))  Resp Panel by RT-PCR (Flu A&B, Covid) Nasopharyngeal Swab     Status: None   Collection Time:  05/12/20 10:23 PM   Specimen: Nasopharyngeal  Swab; Nasopharyngeal(NP) swabs in vial transport medium  Result Value Ref Range Status   SARS Coronavirus 2 by RT PCR NEGATIVE NEGATIVE Final    Comment: (NOTE) SARS-CoV-2 target nucleic acids are NOT DETECTED.  The SARS-CoV-2 RNA is generally detectable in upper respiratory specimens during the acute phase of infection. The lowest concentration of SARS-CoV-2 viral copies this assay can detect is 138 copies/mL. A negative result does not preclude SARS-Cov-2 infection and should not be used as the sole basis for treatment or other patient management decisions. A negative result may occur with  improper specimen collection/handling, submission of specimen other than nasopharyngeal swab, presence of viral mutation(s) within the areas targeted by this assay, and inadequate number of viral copies(<138 copies/mL). A negative result must be combined with clinical observations, patient history, and epidemiological information. The expected result is Negative.  Fact Sheet for Patients:  BloggerCourse.com  Fact Sheet for Healthcare Providers:  SeriousBroker.it  This test is no t yet approved or cleared by the [place] FDA and  has been authorized for detection and/or diagnosis of SARS-CoV-2 by FDA under an Emergency Use Authorization (EUA). This EUA will remain  in effect (meaning this test can be used) for the duration of the COVID-19 declaration under Section 564(b)(1) of the Act, 21 U.S.C.section 360bbb-3(b)(1), unless the authorization is terminated  or revoked sooner.       Influenza A by PCR NEGATIVE NEGATIVE Final   Influenza B by PCR NEGATIVE NEGATIVE Final    Comment: (NOTE) The Xpert Xpress SARS-CoV-2/FLU/RSV plus assay is intended as an aid in the diagnosis of influenza from Nasopharyngeal swab specimens and should not be used as a sole basis for treatment. Nasal washings  and aspirates are unacceptable for Xpert Xpress SARS-CoV-2/FLU/RSV testing.  Fact Sheet for Patients: BloggerCourse.com  Fact Sheet for Healthcare Providers: SeriousBroker.it  This test is not yet approved or cleared by the [place] FDA and has been authorized for detection and/or diagnosis of SARS-CoV-2 by FDA under an Emergency Use Authorization (EUA). This EUA will remain in effect (meaning this test can be used) for the duration of the COVID-19 declaration under Section 564(b)(1) of the Act, 21 U.S.C. section 360bbb-3(b)(1), unless the authorization is terminated or revoked.  Performed at [facility], [address] [place], [place] [address]   MRSA PCR Screening     Status: None   Collection Time: 05/13/20  8:22 PM   Specimen: Nasopharyngeal  Result Value Ref Range Status   MRSA by PCR NEGATIVE NEGATIVE Final    Comment:        The GeneXpert MRSA Assay (FDA approved for NASAL specimens only), is one component of a comprehensive MRSA colonization surveillance program. It is not intended to diagnose MRSA infection nor to guide or monitor treatment for MRSA infections. Performed at [facility], [address], [place], [place] [address]      Labs: Basic Metabolic Panel: Recent Labs  Lab 05/12/20 2130 05/12/20 2328 05/13/20 0248 05/13/20 0257 05/14/20 0549 05/15/20 0027  NA 140  --  141 139 142 139  K 2.9*  --  3.4* 3.5 3.8 3.7  CL 105  --   --  105 111 110  CO2 19*  --   --  19* 24 20*  GLUCOSE 227*  --   --  168* 105* 118*  BUN 15  --   --  13 9 9   CREATININE 1.10*  --   --  0.91 1.02* 0.92  CALCIUM 8.8*  --   --  8.5* 8.3* 8.4*  MG  --  1.8  --   --   --   --    Liver Function Tests: Recent Labs  Lab 05/12/20 2203  AST 36  ALT 15  ALKPHOS 56  BILITOT 0.9  PROT 6.4*  ALBUMIN 3.8   Recent Labs  Lab 05/12/20 2203 05/14/20 0549  LIPASE 25 23   No results for input(s):  AMMONIA in the last 168 hours. CBC: Recent Labs  Lab 05/12/20 2130 05/13/20 0248 05/13/20 0257 05/14/20 0549 05/15/20 0027 05/16/20 0111  WBC 10.5  --  9.5 6.0 6.7 5.5  HGB 10.8* 9.9* 10.9* 9.5* 9.7* 10.3*  HCT 33.3* 29.0* 32.3* 28.4* 28.9* 31.3*  MCV 95.1  --  95.0 93.4 94.4 93.2  PLT 239  --  213 189 194 205   Cardiac Enzymes: No results for input(s): CKTOTAL, CKMB, CKMBINDEX, TROPONINI in the last 168 hours. BNP: BNP (last 3 results) No results for input(s): BNP in the last 8760 hours.  ProBNP (last 3 results) No results for input(s): PROBNP in the last 8760 hours.  CBG: Recent Labs  Lab 05/15/20 1927 05/15/20 2343 05/16/20 0311 05/16/20 0754 05/16/20 1129  GLUCAP 97 111* 118* 116* 110*       Signed:  Darlin Drop, MD [facility] 05/16/2020, 3:56 PM

## 2020-05-16 NOTE — TOC Transition Note (Signed)
Transition of Care [facility]) - CM/SW Discharge Note   Patient Details  Name: Gloria Rowland MRN: [id number] Date of Birth: Sep 09, 1932  Transition of Care [facility]) CM/SW Contact:  Leone Haven, RN Phone Number: 05/16/2020, 3:45 PM   Clinical Narrative:    NCM spoke with daughter who is in the room , NCM offered choice, she chose [place].  NCM made referral to [facility] with [name], he states he is able to take referral.  Soc will begin 24 to 48 hrs post dc.  Daughter states she will need a narrow w/chair.  She is ok with [employer] supplying the wheelchair.  Patient will also need outpatient palliative services.  Daughter would like referral to be made to [facility] for outpatient pall servcies.  NCM made referral to [facility].  Patient is going to daughter's address of [address], [place]  [address].  Daughter will be transporting her home today.   Final next level of care: Home w Home Health Services Barriers to Discharge: No Barriers Identified   Patient Goals and CMS Choice Patient states their goals for this hospitalization and ongoing recovery are:: home with hh CMS Medicare.gov Compare Post Acute Care list provided to:: Patient Represenative (must comment) Choice offered to / list presented to : Adult Children  Discharge Placement                       Discharge Plan and Services                DME Arranged: Lightweight manual wheelchair with seat cushion DME Agency: [employer] Date DME Agency Contacted: 05/16/20 Time DME Agency Contacted: 1544 Representative spoke with at DME Agency: Silvio Pate HH Arranged: RN,PT,Nurse's Aide,Social Work [employer] Agency: [facility] Date [facility] Agency Contacted: 05/16/20 Time HH Agency Contacted: 1544 Representative spoke with at [facility] Agency: Kandee Keen  Social Determinants of Health (SDOH) Interventions     Readmission Risk Interventions No flowsheet data found.

## 2020-05-16 NOTE — Progress Notes (Signed)
Daily Progress Note   Patient Name: Gloria Rowland       Date: 05/16/2020 DOB: 23-Nov-1932  Age: 87 y.o. MRN#: [id number] Attending Physician: Kayleen Memos, DO Primary Care Physician: Glenda Chroman, MD Admit Date: 05/12/2020  Reason for Consultation/Follow-up: To discuss complex medical decision making related to patient's goals of care  Subjective: Met with Dorthy Cooler, PA-C at patient's bedside.  PT/OT are starting to work with her.  With assistance and the rolling walker she is able to get up and slowly walk about 50'.  She is very weak and has difficulty with her breathing during this walk but PT/OT provided excellent support and encouragement.  Patient seemed to enjoy the evaluation.  Called daughter Vonita Moss.  Discussed PT/OT evaluation.  Discussed bradycardia and discontinuation of beta blocker.  Patient is now on an increased dose of amlodipine.   Explained that patient is going to be ready for discharge soon.  Diane asked if the thin wheelchair, outpatient PT/OT, and Palliative care had been arranged.  I advised she would hear from our Case Manager.  Diane was very appreciative of my call.     Assessment: Patient very weak but recovering after NSTEMI.  Nausea and abdominal pain improved today.  Daughter now aware of slowly progressive cystic lesion on patient's pancreas.   Patient Profile/HPI: 87 y.o. female  with past medical history of CAD sp CABG, CKD 3, who was admitted on 05/12/2020 with sharp left sided chest pain.  She was evaluated by cardiology and treated conservatively for an NSTEMI.  She is not a candidate for invasive procedures.   She also has a cyst in the head of her pancreas that has been followed for several years.  It has grown from 1.8 cm to 2.6 cm.    Length of Stay: 3   Vital Signs: BP 136/78 (BP Location: Left Arm)   Pulse (!) 55   Temp (!) 97.4 F (36.3 C) (Oral)   Resp 18   Ht 5' 4"  (1.626 m)   Wt 52.9 kg   SpO2 97%   BMI 20.02 kg/m  SpO2: SpO2: 97 % O2 Device: O2 Device: Room Air O2 Flow Rate:         Palliative Assessment/Data: 50%     Palliative Care Plan    Recommendations/Plan:  DC to home with 24 hour care.  PT/OT/Palliative.  Code Status:  DNR  Prognosis:   < 12 months perhaps significantly less based on recent NSTEMI (not a candidate for corrective procedures) and enlarging pancreatic lesion with poor PO intake.   Discharge Planning:  Home with Home Health and Palliative  Care plan was discussed with Cooper Render  Thank you for allowing the Palliative Medicine Team to assist in the care of this patient.  Total time spent:  35 min     Greater than 50%  of this time was spent counseling and coordinating care related to the above assessment and plan.  Florentina Jenny, PA-C Palliative Medicine  Please contact Palliative MedicineTeam phone at [phone] for questions and concerns between 7 am - 7 pm.   Please see AMION for individual provider pager numbers.

## 2020-05-16 NOTE — Progress Notes (Signed)
    Durable Medical Equipment  (From admission, onward)         Start     Ordered   05/16/20 1526  For home use only DME lightweight manual wheelchair with seat cushion  Once       Comments: Patient suffers from weakness which impairs their ability to perform daily activities like bathing and dressing in the home.  A walker or cane will not resolve  issue with performing activities of daily living. A wheelchair will allow patient to safely perform daily activities. Patient is not able to propel themselves in the home using a standard weight wheelchair due to weakness. Patient can self propel in the lightweight wheelchair. Length of need lifetime. Accessories: elevating leg rests (ELRs), wheel locks, extensions and anti-tippers.   05/16/20 1529

## 2020-05-16 NOTE — Progress Notes (Signed)
RN went over pt discharge summary with pt and her daughter. Pt's daughter to transport pt home. NT removed IV. Pt belongings with pt's daughter.

## 2020-05-18 DIAGNOSIS — F0390 Unspecified dementia without behavioral disturbance: Secondary | ICD-10-CM | POA: Diagnosis not present

## 2020-05-18 DIAGNOSIS — K759 Inflammatory liver disease, unspecified: Secondary | ICD-10-CM | POA: Diagnosis not present

## 2020-05-18 DIAGNOSIS — E43 Unspecified severe protein-calorie malnutrition: Secondary | ICD-10-CM | POA: Diagnosis not present

## 2020-05-18 DIAGNOSIS — I083 Combined rheumatic disorders of mitral, aortic and tricuspid valves: Secondary | ICD-10-CM | POA: Diagnosis not present

## 2020-05-18 DIAGNOSIS — M47814 Spondylosis without myelopathy or radiculopathy, thoracic region: Secondary | ICD-10-CM | POA: Diagnosis not present

## 2020-05-18 DIAGNOSIS — K219 Gastro-esophageal reflux disease without esophagitis: Secondary | ICD-10-CM | POA: Diagnosis not present

## 2020-05-18 DIAGNOSIS — E785 Hyperlipidemia, unspecified: Secondary | ICD-10-CM | POA: Diagnosis not present

## 2020-05-18 DIAGNOSIS — K862 Cyst of pancreas: Secondary | ICD-10-CM | POA: Diagnosis not present

## 2020-05-18 DIAGNOSIS — M660 Rupture of popliteal cyst: Secondary | ICD-10-CM | POA: Diagnosis not present

## 2020-05-18 DIAGNOSIS — F419 Anxiety disorder, unspecified: Secondary | ICD-10-CM | POA: Diagnosis not present

## 2020-05-18 DIAGNOSIS — I214 Non-ST elevation (NSTEMI) myocardial infarction: Secondary | ICD-10-CM | POA: Diagnosis not present

## 2020-05-18 DIAGNOSIS — F32A Depression, unspecified: Secondary | ICD-10-CM | POA: Diagnosis not present

## 2020-05-18 DIAGNOSIS — M5136 Other intervertebral disc degeneration, lumbar region: Secondary | ICD-10-CM | POA: Diagnosis not present

## 2020-05-18 DIAGNOSIS — I700 Atherosclerosis of aorta: Secondary | ICD-10-CM | POA: Diagnosis not present

## 2020-05-18 DIAGNOSIS — G243 Spasmodic torticollis: Secondary | ICD-10-CM | POA: Diagnosis not present

## 2020-05-18 DIAGNOSIS — M47816 Spondylosis without myelopathy or radiculopathy, lumbar region: Secondary | ICD-10-CM | POA: Diagnosis not present

## 2020-05-18 DIAGNOSIS — I2510 Atherosclerotic heart disease of native coronary artery without angina pectoris: Secondary | ICD-10-CM | POA: Diagnosis not present

## 2020-05-18 DIAGNOSIS — N1831 Chronic kidney disease, stage 3a: Secondary | ICD-10-CM | POA: Diagnosis not present

## 2020-05-18 DIAGNOSIS — J9811 Atelectasis: Secondary | ICD-10-CM | POA: Diagnosis not present

## 2020-05-18 DIAGNOSIS — N135 Crossing vessel and stricture of ureter without hydronephrosis: Secondary | ICD-10-CM | POA: Diagnosis not present

## 2020-05-18 DIAGNOSIS — I1310 Hypertensive heart and chronic kidney disease without heart failure, with stage 1 through stage 4 chronic kidney disease, or unspecified chronic kidney disease: Secondary | ICD-10-CM | POA: Diagnosis not present

## 2020-05-18 DIAGNOSIS — M4185 Other forms of scoliosis, thoracolumbar region: Secondary | ICD-10-CM | POA: Diagnosis not present

## 2020-05-18 DIAGNOSIS — I440 Atrioventricular block, first degree: Secondary | ICD-10-CM | POA: Diagnosis not present

## 2020-05-18 DIAGNOSIS — K5730 Diverticulosis of large intestine without perforation or abscess without bleeding: Secondary | ICD-10-CM | POA: Diagnosis not present

## 2020-05-18 DIAGNOSIS — D631 Anemia in chronic kidney disease: Secondary | ICD-10-CM | POA: Diagnosis not present

## 2020-05-20 DIAGNOSIS — I214 Non-ST elevation (NSTEMI) myocardial infarction: Secondary | ICD-10-CM | POA: Diagnosis not present

## 2020-05-20 DIAGNOSIS — I2510 Atherosclerotic heart disease of native coronary artery without angina pectoris: Secondary | ICD-10-CM | POA: Diagnosis not present

## 2020-05-20 DIAGNOSIS — D631 Anemia in chronic kidney disease: Secondary | ICD-10-CM | POA: Diagnosis not present

## 2020-05-20 DIAGNOSIS — N1831 Chronic kidney disease, stage 3a: Secondary | ICD-10-CM | POA: Diagnosis not present

## 2020-05-20 DIAGNOSIS — I083 Combined rheumatic disorders of mitral, aortic and tricuspid valves: Secondary | ICD-10-CM | POA: Diagnosis not present

## 2020-05-20 DIAGNOSIS — I1310 Hypertensive heart and chronic kidney disease without heart failure, with stage 1 through stage 4 chronic kidney disease, or unspecified chronic kidney disease: Secondary | ICD-10-CM | POA: Diagnosis not present

## 2020-05-21 DIAGNOSIS — D631 Anemia in chronic kidney disease: Secondary | ICD-10-CM | POA: Diagnosis not present

## 2020-05-21 DIAGNOSIS — N1831 Chronic kidney disease, stage 3a: Secondary | ICD-10-CM | POA: Diagnosis not present

## 2020-05-21 DIAGNOSIS — I083 Combined rheumatic disorders of mitral, aortic and tricuspid valves: Secondary | ICD-10-CM | POA: Diagnosis not present

## 2020-05-21 DIAGNOSIS — I214 Non-ST elevation (NSTEMI) myocardial infarction: Secondary | ICD-10-CM | POA: Diagnosis not present

## 2020-05-21 DIAGNOSIS — I2510 Atherosclerotic heart disease of native coronary artery without angina pectoris: Secondary | ICD-10-CM | POA: Diagnosis not present

## 2020-05-21 DIAGNOSIS — I1310 Hypertensive heart and chronic kidney disease without heart failure, with stage 1 through stage 4 chronic kidney disease, or unspecified chronic kidney disease: Secondary | ICD-10-CM | POA: Diagnosis not present

## 2020-05-23 DIAGNOSIS — R338 Other retention of urine: Secondary | ICD-10-CM | POA: Diagnosis not present

## 2020-05-24 DIAGNOSIS — I2510 Atherosclerotic heart disease of native coronary artery without angina pectoris: Secondary | ICD-10-CM | POA: Diagnosis not present

## 2020-05-24 DIAGNOSIS — I1310 Hypertensive heart and chronic kidney disease without heart failure, with stage 1 through stage 4 chronic kidney disease, or unspecified chronic kidney disease: Secondary | ICD-10-CM | POA: Diagnosis not present

## 2020-05-24 DIAGNOSIS — N1831 Chronic kidney disease, stage 3a: Secondary | ICD-10-CM | POA: Diagnosis not present

## 2020-05-24 DIAGNOSIS — I214 Non-ST elevation (NSTEMI) myocardial infarction: Secondary | ICD-10-CM | POA: Diagnosis not present

## 2020-05-24 DIAGNOSIS — G3184 Mild cognitive impairment, so stated: Secondary | ICD-10-CM | POA: Diagnosis not present

## 2020-05-24 DIAGNOSIS — Z515 Encounter for palliative care: Secondary | ICD-10-CM | POA: Diagnosis not present

## 2020-05-24 DIAGNOSIS — D631 Anemia in chronic kidney disease: Secondary | ICD-10-CM | POA: Diagnosis not present

## 2020-05-24 DIAGNOSIS — I25119 Atherosclerotic heart disease of native coronary artery with unspecified angina pectoris: Secondary | ICD-10-CM | POA: Diagnosis not present

## 2020-05-24 DIAGNOSIS — I083 Combined rheumatic disorders of mitral, aortic and tricuspid valves: Secondary | ICD-10-CM | POA: Diagnosis not present

## 2020-05-28 DIAGNOSIS — I083 Combined rheumatic disorders of mitral, aortic and tricuspid valves: Secondary | ICD-10-CM | POA: Diagnosis not present

## 2020-05-28 DIAGNOSIS — R001 Bradycardia, unspecified: Secondary | ICD-10-CM | POA: Diagnosis not present

## 2020-05-28 DIAGNOSIS — R0689 Other abnormalities of breathing: Secondary | ICD-10-CM | POA: Diagnosis not present

## 2020-05-28 DIAGNOSIS — I491 Atrial premature depolarization: Secondary | ICD-10-CM | POA: Diagnosis not present

## 2020-05-28 DIAGNOSIS — R079 Chest pain, unspecified: Secondary | ICD-10-CM | POA: Diagnosis not present

## 2020-05-28 DIAGNOSIS — I1310 Hypertensive heart and chronic kidney disease without heart failure, with stage 1 through stage 4 chronic kidney disease, or unspecified chronic kidney disease: Secondary | ICD-10-CM | POA: Diagnosis not present

## 2020-05-28 DIAGNOSIS — I214 Non-ST elevation (NSTEMI) myocardial infarction: Secondary | ICD-10-CM | POA: Diagnosis not present

## 2020-05-28 DIAGNOSIS — I2510 Atherosclerotic heart disease of native coronary artery without angina pectoris: Secondary | ICD-10-CM | POA: Diagnosis not present

## 2020-05-28 DIAGNOSIS — D631 Anemia in chronic kidney disease: Secondary | ICD-10-CM | POA: Diagnosis not present

## 2020-05-28 DIAGNOSIS — R0789 Other chest pain: Secondary | ICD-10-CM | POA: Diagnosis not present

## 2020-05-28 DIAGNOSIS — N1831 Chronic kidney disease, stage 3a: Secondary | ICD-10-CM | POA: Diagnosis not present

## 2020-05-29 DIAGNOSIS — I1310 Hypertensive heart and chronic kidney disease without heart failure, with stage 1 through stage 4 chronic kidney disease, or unspecified chronic kidney disease: Secondary | ICD-10-CM | POA: Diagnosis not present

## 2020-05-29 DIAGNOSIS — I2510 Atherosclerotic heart disease of native coronary artery without angina pectoris: Secondary | ICD-10-CM | POA: Diagnosis not present

## 2020-05-29 DIAGNOSIS — I25119 Atherosclerotic heart disease of native coronary artery with unspecified angina pectoris: Secondary | ICD-10-CM | POA: Diagnosis not present

## 2020-05-29 DIAGNOSIS — Z515 Encounter for palliative care: Secondary | ICD-10-CM | POA: Diagnosis not present

## 2020-05-29 DIAGNOSIS — N1831 Chronic kidney disease, stage 3a: Secondary | ICD-10-CM | POA: Diagnosis not present

## 2020-05-29 DIAGNOSIS — G3184 Mild cognitive impairment, so stated: Secondary | ICD-10-CM | POA: Diagnosis not present

## 2020-05-29 DIAGNOSIS — D631 Anemia in chronic kidney disease: Secondary | ICD-10-CM | POA: Diagnosis not present

## 2020-05-29 DIAGNOSIS — I083 Combined rheumatic disorders of mitral, aortic and tricuspid valves: Secondary | ICD-10-CM | POA: Diagnosis not present

## 2020-05-29 DIAGNOSIS — I214 Non-ST elevation (NSTEMI) myocardial infarction: Secondary | ICD-10-CM | POA: Diagnosis not present

## 2020-05-30 DIAGNOSIS — Z299 Encounter for prophylactic measures, unspecified: Secondary | ICD-10-CM | POA: Diagnosis not present

## 2020-05-30 DIAGNOSIS — F0391 Unspecified dementia with behavioral disturbance: Secondary | ICD-10-CM | POA: Diagnosis not present

## 2020-05-30 DIAGNOSIS — I10 Essential (primary) hypertension: Secondary | ICD-10-CM | POA: Diagnosis not present

## 2020-05-30 DIAGNOSIS — I25118 Atherosclerotic heart disease of native coronary artery with other forms of angina pectoris: Secondary | ICD-10-CM | POA: Diagnosis not present

## 2020-05-30 DIAGNOSIS — D692 Other nonthrombocytopenic purpura: Secondary | ICD-10-CM | POA: Diagnosis not present

## 2020-05-30 DIAGNOSIS — F321 Major depressive disorder, single episode, moderate: Secondary | ICD-10-CM | POA: Diagnosis not present

## 2020-05-31 DIAGNOSIS — I1310 Hypertensive heart and chronic kidney disease without heart failure, with stage 1 through stage 4 chronic kidney disease, or unspecified chronic kidney disease: Secondary | ICD-10-CM | POA: Diagnosis not present

## 2020-05-31 DIAGNOSIS — I214 Non-ST elevation (NSTEMI) myocardial infarction: Secondary | ICD-10-CM | POA: Diagnosis not present

## 2020-05-31 DIAGNOSIS — I083 Combined rheumatic disorders of mitral, aortic and tricuspid valves: Secondary | ICD-10-CM | POA: Diagnosis not present

## 2020-05-31 DIAGNOSIS — D631 Anemia in chronic kidney disease: Secondary | ICD-10-CM | POA: Diagnosis not present

## 2020-05-31 DIAGNOSIS — N1831 Chronic kidney disease, stage 3a: Secondary | ICD-10-CM | POA: Diagnosis not present

## 2020-05-31 DIAGNOSIS — I2510 Atherosclerotic heart disease of native coronary artery without angina pectoris: Secondary | ICD-10-CM | POA: Diagnosis not present

## 2020-06-03 ENCOUNTER — Ambulatory Visit: Payer: Medicare Other | Admitting: Urology

## 2020-06-04 ENCOUNTER — Encounter: Payer: Self-pay | Admitting: Cardiovascular Disease

## 2020-06-04 ENCOUNTER — Ambulatory Visit (INDEPENDENT_AMBULATORY_CARE_PROVIDER_SITE_OTHER): Payer: Medicare Other | Admitting: Cardiovascular Disease

## 2020-06-04 ENCOUNTER — Other Ambulatory Visit: Payer: Self-pay

## 2020-06-04 VITALS — BP 125/62 | HR 64 | Ht 64.0 in | Wt 116.4 lb

## 2020-06-04 DIAGNOSIS — I214 Non-ST elevation (NSTEMI) myocardial infarction: Secondary | ICD-10-CM

## 2020-06-04 DIAGNOSIS — I10 Essential (primary) hypertension: Secondary | ICD-10-CM

## 2020-06-04 DIAGNOSIS — F0390 Unspecified dementia without behavioral disturbance: Secondary | ICD-10-CM | POA: Diagnosis not present

## 2020-06-04 DIAGNOSIS — N1831 Chronic kidney disease, stage 3a: Secondary | ICD-10-CM | POA: Diagnosis not present

## 2020-06-04 DIAGNOSIS — D631 Anemia in chronic kidney disease: Secondary | ICD-10-CM | POA: Diagnosis not present

## 2020-06-04 DIAGNOSIS — E785 Hyperlipidemia, unspecified: Secondary | ICD-10-CM

## 2020-06-04 DIAGNOSIS — I083 Combined rheumatic disorders of mitral, aortic and tricuspid valves: Secondary | ICD-10-CM | POA: Diagnosis not present

## 2020-06-04 DIAGNOSIS — I2510 Atherosclerotic heart disease of native coronary artery without angina pectoris: Secondary | ICD-10-CM | POA: Diagnosis not present

## 2020-06-04 DIAGNOSIS — I1310 Hypertensive heart and chronic kidney disease without heart failure, with stage 1 through stage 4 chronic kidney disease, or unspecified chronic kidney disease: Secondary | ICD-10-CM | POA: Diagnosis not present

## 2020-06-04 NOTE — Progress Notes (Signed)
Cardiology Office Note  Date:  06/04/2020   ID:  Gloria Rowland, DOB [birth year], MRN [id number]  PCP:  Ignatius Specking, MD  Cardiologist:   Chilton Si, MD   No chief complaint on file.    History of Present Illness: Gloria Rowland is a 87 y.o. female with CAD s/p CABG, hypertension, hyperlipidemia, dementia, hiatal hernia, chronic pancreatic cysts, and urinary retention who presents for follow up.  Gloria Rowland was admitted to the hospital 04/2020 with abdominal pain, nausea, and vomiting.  High-sensitivity troponin was elevated to 789.  She had an echo that admission that revealed LVEF 45 to 50% with hypokinesis of the mid to distal inferior and inferoseptal myocardium.  Given her dementia and other comorbidities an ischemic evaluation was not pursued.  She was treated with IV heparin and then transition to aspirin and clopidogrel.  Blood pressure was poorly controlled so amlodipine was increased that hospitalization.  Her hospitalization was also complicated by urinary retention requiring Foley catheter placement.    Since being discharged from the hospital Gloria Rowland has been well.  Her daughter notes that her appetite has been good.  She is doing in-home physical therapy and has no exertional chest pain or shortness of breath.  She has not noted any lower prescription edema, orthopnea, or PND.  She did have 1 episode of chest discomfort.  It occurred after eating a taco salad.  It persisted for several minutes so they called EMS.  An EKG was reported at the time which showed sinus rhythm with some PACs.  She had persistent anterior T wave inversions.  She took some Gas-X and the symptoms resolved.  Overall her blood pressure has been pretty stable.  She is without complaint.  She previously had a left heart cath in 2013 that revealed 10 to 20% left main, occluded LAD with collateral feeling from a LIMA, and 40% ostial RCA disease.   Past Medical History:  Diagnosis Date  . Baker's  cyst, ruptured   . CAD (coronary artery disease)   . Dementia (HCC)   . History of hepatitis   . Hyperlipidemia   . Hypertension   . Lumbar degenerative disc disease   . Nephrolithiasis   . Spasmodic torticollis     Past Surgical History:  Procedure Laterality Date  . ABDOMINAL HYSTERECTOMY    . CARPAL TUNNEL RELEASE    . CHOLECYSTECTOMY    . CORONARY ARTERY BYPASS GRAFT  1998   LIMA to LAD-Dx  . ESOPHAGOGASTRODUODENOSCOPY N/A 01/15/2017   Procedure: ESOPHAGOGASTRODUODENOSCOPY (EGD);  Surgeon: Malissa Hippo, MD;  Location: [facility]O [facility]ITE;  Service: Endoscopy;  Laterality: N/A;  1:00  . KIDNEY STONE SURGERY    . LEFT HEART CATHETERIZATION WITH CORONARY/GRAFT ANGIOGRAM N/A 05/26/2011   Procedure: LEFT HEART CATHETERIZATION WITH Isabel Caprice;  Surgeon: Othella Boyer, MD;  Location: [facility] CATH LAB;  Service: Cardiovascular;  Laterality: N/A;  . LUMBAR LAMINECTOMY       Current Outpatient Medications  Medication Sig Dispense Refill  . acetaminophen (TYLENOL) 650 MG CR tablet Take 650 mg by mouth every 6 (six) hours as needed for pain.    Marland Kitchen amLODipine (NORVASC) 10 MG tablet Take 1 tablet (10 mg total) by mouth daily. 90 tablet 0  . aspirin EC 81 MG tablet Take 81 mg by mouth daily.    . citalopram (CELEXA) 40 MG tablet Take 40 mg by mouth daily.    . clopidogrel (PLAVIX) 75 MG tablet Take 1 tablet (  75 mg total) by mouth daily. 360 tablet 0  . Dextromethorphan-guaiFENesin 5-100 MG/5ML LIQD Give 10 ml as needed for cough every 4 hours prn    . gemfibrozil (LOPID) 600 MG tablet Take 300 mg by mouth 2 (two) times daily before a meal.    . isosorbide mononitrate (IMDUR) 30 MG 24 hr tablet Take 1 tablet (30 mg total) by mouth daily. 90 tablet 0  . lisinopril (ZESTRIL) 20 MG tablet Take 1 tablet (20 mg total) by mouth daily. 90 tablet 0  . memantine (NAMENDA) 10 MG tablet Take 10 mg by mouth 2 (two) times daily.    . ondansetron (ZOFRAN ODT) 4 MG disintegrating tablet Take 1  tablet (4 mg total) by mouth every 8 (eight) hours as needed for nausea or vomiting. 20 tablet 0  . pantoprazole (PROTONIX) 40 MG tablet Take 1 tablet (40 mg total) by mouth daily. 30 tablet 1  . polyethylene glycol powder (GLYCOLAX/MIRALAX) powder Take 17 g by mouth daily. For constipation.  3  . rosuvastatin (CRESTOR) 20 MG tablet Take 1 tablet (20 mg total) by mouth daily. 90 tablet 0  . simethicone (MYLICON) 80 MG chewable tablet Chew 1 tablet (80 mg total) by mouth 4 (four) times daily as needed for flatulence. 30 tablet 0  . Vitamin D, Ergocalciferol, (DRISDOL) 50000 units CAPS capsule Take 50,000 Units by mouth every 7 (seven) days.    . nitroGLYCERIN (NITROSTAT) 0.4 MG SL tablet Place 1 tablet (0.4 mg total) under the tongue every 5 (five) minutes x 3 doses as needed for up to 10 days for chest pain. 20 tablet 0   No current facility-administered medications for this visit.    Allergies:   Aspirin and Codeine    Social History:  The patient  reports that she has never smoked. She has never used smokeless tobacco. She reports that she does not drink alcohol and does not use drugs.   Family History:  The patient's family history includes Diabetes in her sister and sister.    ROS:  Please see the history of present illness.   Otherwise, review of systems are positive for none.   All other systems are reviewed and negative.    PHYSICAL EXAM: VS:  BP 125/62   Pulse 64   Ht 5\' 4"  (1.626 m)   Wt 116 lb 6.4 oz (52.8 kg)   SpO2 98%   BMI 19.98 kg/m  , BMI Body mass index is 19.98 kg/m. GENERAL:  Well appearing HEENT:  Pupils equal round and reactive, fundi not visualized, oral mucosa unremarkable NECK:  No jugular venous distention, waveform within normal limits, carotid upstroke brisk and symmetric, no bruits LUNGS:  Clear to auscultation bilaterally HEART:  RRR.  PMI not displaced or sustained,S1 and S2 within normal limits, no S3, no S4, no clicks, no rubs, no murmurs ABD:   Flat, positive bowel sounds normal in frequency in pitch, no bruits, no rebound, no guarding, no midline pulsatile mass, no hepatomegaly, no splenomegaly EXT:  2 plus pulses throughout, no edema, no cyanosis no clubbing SKIN:  No rashes no nodules NEURO:  Cranial nerves II through XII grossly intact, motor grossly intact throughout PSYCH:  Oriented to person and place  EKG:  EKG is ordered today. The ekg ordered today demonstrates sinus rhythm.  Rate 64 bpm.  Prior anteroseptal infarct.  Echo 05/13/20: 1. LVEF Is depressed severe hypokinesis of the mid/distal inferior,  inferoseptal walls.  Left ventricular ejection fraction, by estimation,  is  45 to 50%. The left ventricle has mildly decreased function. There is  mild left ventricular hypertrophy. Left  ventricular diastolic parameters are consistent with Grade I diastolic  dysfunction (impaired relaxation).  2. Right ventricular systolic function is normal. The right ventricular  size is normal. There is normal pulmonary artery systolic pressure.  3. The mitral valve is abnormal. Trivial mitral valve regurgitation.  4. The aortic valve is abnormal. Aortic valve regurgitation is mild. Mild  aortic valve sclerosis is present, with no evidence of aortic valve  stenosis.  5. The inferior vena cava is normal in size with greater than 50%  respiratory variability, suggesting right atrial pressure of 3 mmHg.   LHC 05/26/2011: Patent LIMA-->LAD-->diagonal Mild ostial RCA stenosis Mild ostial LCX stenosis LAD 100% occluded LVEF 60%  Recent Labs: 05/12/2020: ALT 15; Magnesium 1.8 05/15/2020: BUN 9; Creatinine, Ser 0.92; Potassium 3.7; Sodium 139 05/16/2020: Hemoglobin 10.3; Platelets 205    Lipid Panel    Component Value Date/Time   CHOL 141 05/16/2020 0621   TRIG 99 05/16/2020 0621   HDL 63 05/16/2020 0621   CHOLHDL 2.2 05/16/2020 0621   VLDL 20 05/16/2020 0621   LDLCALC 58 05/16/2020 0621      Wt Readings from Last 3  Encounters:  06/04/20 116 lb 6.4 oz (52.8 kg)  05/13/20 116 lb 10 oz (52.9 kg)  04/10/18 113 lb 15.7 oz (51.7 kg)       Patient Profile     87 y.o. female with CAD s/p CABG, dementia, hiatial hernia, hypertension, and hyperlipidemia admitted with nausea, vomiting, chest and back pain.  Assessment & Plan    # CAD s/p CABG:  # NSTEMI:  # Hyperlipidemia:  She has a prior CABG.  She did have an NSTEMI but was medically managed 04/2018.  She is doing very well.  She had an episode of chest pain that seems more related to gastroesophageal reflux disease and ischemia.  She is exercising with PT and ambulating without difficulty.  Continue aspirin, clopidogrel, Imdur, and rosuvastatin.  Lipids well-controlled.  No beta-blocker due to bradycardia.  Plan to discontinue clopidogrel after 1 year.  #Chronic systolic and diastolic heart failure:  # Hypertension: LVEF 45-50%.  She is euvolemic.  Continue amlodipine and lisinopril.  She is not on a beta-blocker due to bradycardia.  Continue Imdur.  Blood pressures well have been controlled    Current medicines are reviewed at length with the patient today.  The patient does not have concerns regarding medicines.  The following changes have been made:  no change  Labs/ tests ordered today include:  No orders of the defined types were placed in this encounter.    Disposition:   FU with Tiffany C. Duke Salvia, MD, [facility] in 3 months.      Signed, Tiffany C. Duke Salvia, MD, [facility]  06/04/2020 9:50 AM    [facility]

## 2020-06-04 NOTE — Patient Instructions (Signed)
Medication Instructions:  No Changes In Medications at this time.  *If you need a refill on your cardiac medications before your next appointment, please call your pharmacy*  Follow-Up: At [facility], you and your health needs are our priority.  As part of our continuing mission to provide you with exceptional heart care, we have created designated Provider Care Teams.  These Care Teams include your primary Cardiologist (physician) and Advanced Practice Providers (APPs -  Physician Assistants and Nurse Practitioners) who all work together to provide you with the care you need, when you need it.  We recommend signing up for the patient portal called "MyChart".  Sign up information is provided on this After Visit Summary.  MyChart is used to connect with patients for Virtual Visits (Telemedicine).  Patients are able to view lab/test results, encounter notes, upcoming appointments, etc.  Non-urgent messages can be sent to your provider as well.   To learn more about what you can do with MyChart, go to [email or web address].    Your next appointment:   3 month(s)  The format for your next appointment:   In Person  Provider:   Chilton Si, MD

## 2020-06-05 DIAGNOSIS — I1310 Hypertensive heart and chronic kidney disease without heart failure, with stage 1 through stage 4 chronic kidney disease, or unspecified chronic kidney disease: Secondary | ICD-10-CM | POA: Diagnosis not present

## 2020-06-05 DIAGNOSIS — N1831 Chronic kidney disease, stage 3a: Secondary | ICD-10-CM | POA: Diagnosis not present

## 2020-06-05 DIAGNOSIS — I083 Combined rheumatic disorders of mitral, aortic and tricuspid valves: Secondary | ICD-10-CM | POA: Diagnosis not present

## 2020-06-05 DIAGNOSIS — I2510 Atherosclerotic heart disease of native coronary artery without angina pectoris: Secondary | ICD-10-CM | POA: Diagnosis not present

## 2020-06-05 DIAGNOSIS — I214 Non-ST elevation (NSTEMI) myocardial infarction: Secondary | ICD-10-CM | POA: Diagnosis not present

## 2020-06-05 DIAGNOSIS — D631 Anemia in chronic kidney disease: Secondary | ICD-10-CM | POA: Diagnosis not present

## 2020-06-06 DIAGNOSIS — I214 Non-ST elevation (NSTEMI) myocardial infarction: Secondary | ICD-10-CM | POA: Diagnosis not present

## 2020-06-06 DIAGNOSIS — I2510 Atherosclerotic heart disease of native coronary artery without angina pectoris: Secondary | ICD-10-CM | POA: Diagnosis not present

## 2020-06-06 DIAGNOSIS — I1310 Hypertensive heart and chronic kidney disease without heart failure, with stage 1 through stage 4 chronic kidney disease, or unspecified chronic kidney disease: Secondary | ICD-10-CM | POA: Diagnosis not present

## 2020-06-06 DIAGNOSIS — N1831 Chronic kidney disease, stage 3a: Secondary | ICD-10-CM | POA: Diagnosis not present

## 2020-06-06 DIAGNOSIS — I083 Combined rheumatic disorders of mitral, aortic and tricuspid valves: Secondary | ICD-10-CM | POA: Diagnosis not present

## 2020-06-06 DIAGNOSIS — D631 Anemia in chronic kidney disease: Secondary | ICD-10-CM | POA: Diagnosis not present

## 2020-06-07 DIAGNOSIS — I214 Non-ST elevation (NSTEMI) myocardial infarction: Secondary | ICD-10-CM | POA: Diagnosis not present

## 2020-06-07 DIAGNOSIS — I2510 Atherosclerotic heart disease of native coronary artery without angina pectoris: Secondary | ICD-10-CM | POA: Diagnosis not present

## 2020-06-07 DIAGNOSIS — I1310 Hypertensive heart and chronic kidney disease without heart failure, with stage 1 through stage 4 chronic kidney disease, or unspecified chronic kidney disease: Secondary | ICD-10-CM | POA: Diagnosis not present

## 2020-06-07 DIAGNOSIS — N1831 Chronic kidney disease, stage 3a: Secondary | ICD-10-CM | POA: Diagnosis not present

## 2020-06-11 DIAGNOSIS — I083 Combined rheumatic disorders of mitral, aortic and tricuspid valves: Secondary | ICD-10-CM | POA: Diagnosis not present

## 2020-06-11 DIAGNOSIS — I2510 Atherosclerotic heart disease of native coronary artery without angina pectoris: Secondary | ICD-10-CM | POA: Diagnosis not present

## 2020-06-11 DIAGNOSIS — I1310 Hypertensive heart and chronic kidney disease without heart failure, with stage 1 through stage 4 chronic kidney disease, or unspecified chronic kidney disease: Secondary | ICD-10-CM | POA: Diagnosis not present

## 2020-06-11 DIAGNOSIS — N1831 Chronic kidney disease, stage 3a: Secondary | ICD-10-CM | POA: Diagnosis not present

## 2020-06-11 DIAGNOSIS — D631 Anemia in chronic kidney disease: Secondary | ICD-10-CM | POA: Diagnosis not present

## 2020-06-11 DIAGNOSIS — I214 Non-ST elevation (NSTEMI) myocardial infarction: Secondary | ICD-10-CM | POA: Diagnosis not present

## 2020-06-12 DIAGNOSIS — I083 Combined rheumatic disorders of mitral, aortic and tricuspid valves: Secondary | ICD-10-CM | POA: Diagnosis not present

## 2020-06-12 DIAGNOSIS — N1831 Chronic kidney disease, stage 3a: Secondary | ICD-10-CM | POA: Diagnosis not present

## 2020-06-12 DIAGNOSIS — I214 Non-ST elevation (NSTEMI) myocardial infarction: Secondary | ICD-10-CM | POA: Diagnosis not present

## 2020-06-12 DIAGNOSIS — D631 Anemia in chronic kidney disease: Secondary | ICD-10-CM | POA: Diagnosis not present

## 2020-06-12 DIAGNOSIS — I1310 Hypertensive heart and chronic kidney disease without heart failure, with stage 1 through stage 4 chronic kidney disease, or unspecified chronic kidney disease: Secondary | ICD-10-CM | POA: Diagnosis not present

## 2020-06-12 DIAGNOSIS — I2510 Atherosclerotic heart disease of native coronary artery without angina pectoris: Secondary | ICD-10-CM | POA: Diagnosis not present

## 2020-06-14 DIAGNOSIS — N1831 Chronic kidney disease, stage 3a: Secondary | ICD-10-CM | POA: Diagnosis not present

## 2020-06-14 DIAGNOSIS — I214 Non-ST elevation (NSTEMI) myocardial infarction: Secondary | ICD-10-CM | POA: Diagnosis not present

## 2020-06-14 DIAGNOSIS — D631 Anemia in chronic kidney disease: Secondary | ICD-10-CM | POA: Diagnosis not present

## 2020-06-14 DIAGNOSIS — I083 Combined rheumatic disorders of mitral, aortic and tricuspid valves: Secondary | ICD-10-CM | POA: Diagnosis not present

## 2020-06-14 DIAGNOSIS — I2510 Atherosclerotic heart disease of native coronary artery without angina pectoris: Secondary | ICD-10-CM | POA: Diagnosis not present

## 2020-06-14 DIAGNOSIS — I1310 Hypertensive heart and chronic kidney disease without heart failure, with stage 1 through stage 4 chronic kidney disease, or unspecified chronic kidney disease: Secondary | ICD-10-CM | POA: Diagnosis not present

## 2020-06-17 DIAGNOSIS — M660 Rupture of popliteal cyst: Secondary | ICD-10-CM | POA: Diagnosis not present

## 2020-06-17 DIAGNOSIS — F32A Depression, unspecified: Secondary | ICD-10-CM | POA: Diagnosis not present

## 2020-06-17 DIAGNOSIS — K862 Cyst of pancreas: Secondary | ICD-10-CM | POA: Diagnosis not present

## 2020-06-17 DIAGNOSIS — I2510 Atherosclerotic heart disease of native coronary artery without angina pectoris: Secondary | ICD-10-CM | POA: Diagnosis not present

## 2020-06-17 DIAGNOSIS — E785 Hyperlipidemia, unspecified: Secondary | ICD-10-CM | POA: Diagnosis not present

## 2020-06-17 DIAGNOSIS — E43 Unspecified severe protein-calorie malnutrition: Secondary | ICD-10-CM | POA: Diagnosis not present

## 2020-06-17 DIAGNOSIS — I700 Atherosclerosis of aorta: Secondary | ICD-10-CM | POA: Diagnosis not present

## 2020-06-17 DIAGNOSIS — M4185 Other forms of scoliosis, thoracolumbar region: Secondary | ICD-10-CM | POA: Diagnosis not present

## 2020-06-17 DIAGNOSIS — M47814 Spondylosis without myelopathy or radiculopathy, thoracic region: Secondary | ICD-10-CM | POA: Diagnosis not present

## 2020-06-17 DIAGNOSIS — I083 Combined rheumatic disorders of mitral, aortic and tricuspid valves: Secondary | ICD-10-CM | POA: Diagnosis not present

## 2020-06-17 DIAGNOSIS — M47816 Spondylosis without myelopathy or radiculopathy, lumbar region: Secondary | ICD-10-CM | POA: Diagnosis not present

## 2020-06-17 DIAGNOSIS — I214 Non-ST elevation (NSTEMI) myocardial infarction: Secondary | ICD-10-CM | POA: Diagnosis not present

## 2020-06-17 DIAGNOSIS — N1831 Chronic kidney disease, stage 3a: Secondary | ICD-10-CM | POA: Diagnosis not present

## 2020-06-17 DIAGNOSIS — G243 Spasmodic torticollis: Secondary | ICD-10-CM | POA: Diagnosis not present

## 2020-06-17 DIAGNOSIS — K5730 Diverticulosis of large intestine without perforation or abscess without bleeding: Secondary | ICD-10-CM | POA: Diagnosis not present

## 2020-06-17 DIAGNOSIS — F0390 Unspecified dementia without behavioral disturbance: Secondary | ICD-10-CM | POA: Diagnosis not present

## 2020-06-17 DIAGNOSIS — K219 Gastro-esophageal reflux disease without esophagitis: Secondary | ICD-10-CM | POA: Diagnosis not present

## 2020-06-17 DIAGNOSIS — D631 Anemia in chronic kidney disease: Secondary | ICD-10-CM | POA: Diagnosis not present

## 2020-06-17 DIAGNOSIS — N135 Crossing vessel and stricture of ureter without hydronephrosis: Secondary | ICD-10-CM | POA: Diagnosis not present

## 2020-06-17 DIAGNOSIS — K759 Inflammatory liver disease, unspecified: Secondary | ICD-10-CM | POA: Diagnosis not present

## 2020-06-17 DIAGNOSIS — J9811 Atelectasis: Secondary | ICD-10-CM | POA: Diagnosis not present

## 2020-06-17 DIAGNOSIS — M5136 Other intervertebral disc degeneration, lumbar region: Secondary | ICD-10-CM | POA: Diagnosis not present

## 2020-06-17 DIAGNOSIS — I440 Atrioventricular block, first degree: Secondary | ICD-10-CM | POA: Diagnosis not present

## 2020-06-17 DIAGNOSIS — F419 Anxiety disorder, unspecified: Secondary | ICD-10-CM | POA: Diagnosis not present

## 2020-06-17 DIAGNOSIS — I1310 Hypertensive heart and chronic kidney disease without heart failure, with stage 1 through stage 4 chronic kidney disease, or unspecified chronic kidney disease: Secondary | ICD-10-CM | POA: Diagnosis not present

## 2020-06-18 DIAGNOSIS — I083 Combined rheumatic disorders of mitral, aortic and tricuspid valves: Secondary | ICD-10-CM | POA: Diagnosis not present

## 2020-06-18 DIAGNOSIS — I214 Non-ST elevation (NSTEMI) myocardial infarction: Secondary | ICD-10-CM | POA: Diagnosis not present

## 2020-06-18 DIAGNOSIS — I1310 Hypertensive heart and chronic kidney disease without heart failure, with stage 1 through stage 4 chronic kidney disease, or unspecified chronic kidney disease: Secondary | ICD-10-CM | POA: Diagnosis not present

## 2020-06-18 DIAGNOSIS — N1831 Chronic kidney disease, stage 3a: Secondary | ICD-10-CM | POA: Diagnosis not present

## 2020-06-18 DIAGNOSIS — D631 Anemia in chronic kidney disease: Secondary | ICD-10-CM | POA: Diagnosis not present

## 2020-06-18 DIAGNOSIS — I2510 Atherosclerotic heart disease of native coronary artery without angina pectoris: Secondary | ICD-10-CM | POA: Diagnosis not present

## 2020-06-19 ENCOUNTER — Other Ambulatory Visit: Payer: Self-pay

## 2020-06-19 ENCOUNTER — Encounter (HOSPITAL_COMMUNITY): Payer: Self-pay | Admitting: Emergency Medicine

## 2020-06-19 ENCOUNTER — Inpatient Hospital Stay (HOSPITAL_COMMUNITY)
Admission: EM | Admit: 2020-06-19 | Discharge: 2020-06-24 | DRG: 871 | Disposition: A | Discharge status: Skilled Nursing Facility | Payer: Medicare Other | Attending: Family Medicine | Admitting: Family Medicine

## 2020-06-19 ENCOUNTER — Emergency Department (HOSPITAL_COMMUNITY): Payer: Medicare Other

## 2020-06-19 DIAGNOSIS — N1831 Chronic kidney disease, stage 3a: Secondary | ICD-10-CM | POA: Diagnosis not present

## 2020-06-19 DIAGNOSIS — Z66 Do not resuscitate: Secondary | ICD-10-CM | POA: Diagnosis present

## 2020-06-19 DIAGNOSIS — A401 Sepsis due to streptococcus, group B: Principal | ICD-10-CM | POA: Diagnosis present

## 2020-06-19 DIAGNOSIS — N390 Urinary tract infection, site not specified: Secondary | ICD-10-CM | POA: Diagnosis not present

## 2020-06-19 DIAGNOSIS — N179 Acute kidney failure, unspecified: Secondary | ICD-10-CM | POA: Diagnosis present

## 2020-06-19 DIAGNOSIS — Z885 Allergy status to narcotic agent status: Secondary | ICD-10-CM

## 2020-06-19 DIAGNOSIS — Z886 Allergy status to analgesic agent status: Secondary | ICD-10-CM

## 2020-06-19 DIAGNOSIS — E872 Acidosis, unspecified: Secondary | ICD-10-CM | POA: Diagnosis present

## 2020-06-19 DIAGNOSIS — R404 Transient alteration of awareness: Secondary | ICD-10-CM | POA: Diagnosis not present

## 2020-06-19 DIAGNOSIS — I2510 Atherosclerotic heart disease of native coronary artery without angina pectoris: Secondary | ICD-10-CM | POA: Diagnosis present

## 2020-06-19 DIAGNOSIS — G9341 Metabolic encephalopathy: Secondary | ICD-10-CM | POA: Diagnosis not present

## 2020-06-19 DIAGNOSIS — Z79899 Other long term (current) drug therapy: Secondary | ICD-10-CM

## 2020-06-19 DIAGNOSIS — Z90710 Acquired absence of both cervix and uterus: Secondary | ICD-10-CM

## 2020-06-19 DIAGNOSIS — E876 Hypokalemia: Secondary | ICD-10-CM | POA: Diagnosis not present

## 2020-06-19 DIAGNOSIS — Z20822 Contact with and (suspected) exposure to covid-19: Secondary | ICD-10-CM | POA: Diagnosis not present

## 2020-06-19 DIAGNOSIS — Z515 Encounter for palliative care: Secondary | ICD-10-CM | POA: Diagnosis not present

## 2020-06-19 DIAGNOSIS — R339 Retention of urine, unspecified: Secondary | ICD-10-CM | POA: Diagnosis not present

## 2020-06-19 DIAGNOSIS — R509 Fever, unspecified: Secondary | ICD-10-CM | POA: Diagnosis not present

## 2020-06-19 DIAGNOSIS — M5136 Other intervertebral disc degeneration, lumbar region: Secondary | ICD-10-CM | POA: Diagnosis present

## 2020-06-19 DIAGNOSIS — Z7902 Long term (current) use of antithrombotics/antiplatelets: Secondary | ICD-10-CM

## 2020-06-19 DIAGNOSIS — F329 Major depressive disorder, single episode, unspecified: Secondary | ICD-10-CM | POA: Diagnosis not present

## 2020-06-19 DIAGNOSIS — R0689 Other abnormalities of breathing: Secondary | ICD-10-CM | POA: Diagnosis not present

## 2020-06-19 DIAGNOSIS — A419 Sepsis, unspecified organism: Secondary | ICD-10-CM | POA: Diagnosis not present

## 2020-06-19 DIAGNOSIS — I10 Essential (primary) hypertension: Secondary | ICD-10-CM | POA: Diagnosis not present

## 2020-06-19 DIAGNOSIS — Z7982 Long term (current) use of aspirin: Secondary | ICD-10-CM

## 2020-06-19 DIAGNOSIS — G3184 Mild cognitive impairment, so stated: Secondary | ICD-10-CM | POA: Diagnosis not present

## 2020-06-19 DIAGNOSIS — R410 Disorientation, unspecified: Secondary | ICD-10-CM | POA: Diagnosis not present

## 2020-06-19 DIAGNOSIS — R338 Other retention of urine: Secondary | ICD-10-CM

## 2020-06-19 DIAGNOSIS — Z960 Presence of urogenital implants: Secondary | ICD-10-CM | POA: Diagnosis not present

## 2020-06-19 DIAGNOSIS — K759 Inflammatory liver disease, unspecified: Secondary | ICD-10-CM | POA: Diagnosis present

## 2020-06-19 DIAGNOSIS — R4182 Altered mental status, unspecified: Secondary | ICD-10-CM | POA: Diagnosis not present

## 2020-06-19 DIAGNOSIS — Z87442 Personal history of urinary calculi: Secondary | ICD-10-CM

## 2020-06-19 DIAGNOSIS — I129 Hypertensive chronic kidney disease with stage 1 through stage 4 chronic kidney disease, or unspecified chronic kidney disease: Secondary | ICD-10-CM | POA: Diagnosis present

## 2020-06-19 DIAGNOSIS — Z951 Presence of aortocoronary bypass graft: Secondary | ICD-10-CM

## 2020-06-19 DIAGNOSIS — R739 Hyperglycemia, unspecified: Secondary | ICD-10-CM | POA: Diagnosis present

## 2020-06-19 DIAGNOSIS — G243 Spasmodic torticollis: Secondary | ICD-10-CM | POA: Diagnosis present

## 2020-06-19 DIAGNOSIS — I252 Old myocardial infarction: Secondary | ICD-10-CM

## 2020-06-19 DIAGNOSIS — I25119 Atherosclerotic heart disease of native coronary artery with unspecified angina pectoris: Secondary | ICD-10-CM | POA: Diagnosis not present

## 2020-06-19 DIAGNOSIS — F0390 Unspecified dementia without behavioral disturbance: Secondary | ICD-10-CM | POA: Diagnosis present

## 2020-06-19 DIAGNOSIS — K219 Gastro-esophageal reflux disease without esophagitis: Secondary | ICD-10-CM | POA: Diagnosis not present

## 2020-06-19 DIAGNOSIS — E785 Hyperlipidemia, unspecified: Secondary | ICD-10-CM | POA: Diagnosis present

## 2020-06-19 LAB — CBC WITH DIFFERENTIAL/PLATELET
Abs Immature Granulocytes: 0.1 10*3/uL — ABNORMAL HIGH (ref 0.00–0.07)
Basophils Absolute: 0 10*3/uL (ref 0.0–0.1)
Basophils Relative: 0 %
Eosinophils Absolute: 0 10*3/uL (ref 0.0–0.5)
Eosinophils Relative: 0 %
HCT: 33 % — ABNORMAL LOW (ref 36.0–46.0)
Hemoglobin: 10.6 g/dL — ABNORMAL LOW (ref 12.0–15.0)
Immature Granulocytes: 1 %
Lymphocytes Relative: 12 %
Lymphs Abs: 2.3 10*3/uL (ref 0.7–4.0)
MCH: 30.8 pg (ref 26.0–34.0)
MCHC: 32.1 g/dL (ref 30.0–36.0)
MCV: 95.9 fL (ref 80.0–100.0)
Monocytes Absolute: 0.3 10*3/uL (ref 0.1–1.0)
Monocytes Relative: 1 %
Neutro Abs: 16.4 10*3/uL — ABNORMAL HIGH (ref 1.7–7.7)
Neutrophils Relative %: 86 %
Platelets: 262 10*3/uL (ref 150–400)
RBC: 3.44 MIL/uL — ABNORMAL LOW (ref 3.87–5.11)
RDW: 13.1 % (ref 11.5–15.5)
WBC: 19.1 10*3/uL — ABNORMAL HIGH (ref 4.0–10.5)
nRBC: 0 % (ref 0.0–0.2)

## 2020-06-19 LAB — URINALYSIS, ROUTINE W REFLEX MICROSCOPIC
Bilirubin Urine: NEGATIVE
Glucose, UA: NEGATIVE mg/dL
Ketones, ur: NEGATIVE mg/dL
Nitrite: NEGATIVE
Protein, ur: 100 mg/dL — AB
RBC / HPF: >50 RBC/hpf — ABNORMAL HIGH (ref 0–5)
Specific Gravity, Urine: 1.019 (ref 1.005–1.030)
WBC, UA: >50 WBC/hpf — ABNORMAL HIGH (ref 0–5)
pH: 5 (ref 5.0–8.0)

## 2020-06-19 LAB — COMPREHENSIVE METABOLIC PANEL
ALT: 13 U/L (ref 0–44)
AST: 24 U/L (ref 15–41)
Albumin: 4.1 g/dL (ref 3.5–5.0)
Alkaline Phosphatase: 68 U/L (ref 38–126)
Anion gap: 17 — ABNORMAL HIGH (ref 5–15)
BUN: 32 mg/dL — ABNORMAL HIGH (ref 8–23)
CO2: 17 mmol/L — ABNORMAL LOW (ref 22–32)
Calcium: 8.9 mg/dL (ref 8.9–10.3)
Chloride: 101 mmol/L (ref 98–111)
Creatinine, Ser: 1.54 mg/dL — ABNORMAL HIGH (ref 0.44–1.00)
GFR, Estimated: 33 mL/min — ABNORMAL LOW (ref >60)
Glucose, Bld: 244 mg/dL — ABNORMAL HIGH (ref 70–99)
Potassium: 4.2 mmol/L (ref 3.5–5.1)
Sodium: 135 mmol/L (ref 135–145)
Total Bilirubin: 0.9 mg/dL (ref 0.3–1.2)
Total Protein: 7.6 g/dL (ref 6.5–8.1)

## 2020-06-19 LAB — RESP PANEL BY RT-PCR (FLU A&B, COVID) ARPGX2
Influenza A by PCR: NEGATIVE
Influenza B by PCR: NEGATIVE
SARS Coronavirus 2 by RT PCR: NEGATIVE

## 2020-06-19 LAB — LACTIC ACID, PLASMA
Lactic Acid, Venous: 5.8 mmol/L (ref 0.5–1.9)
Lactic Acid, Venous: 2.3 mmol/L (ref 0.5–1.9)

## 2020-06-19 LAB — PROTIME-INR
INR: 1.2 (ref 0.8–1.2)
Prothrombin Time: 14.6 seconds (ref 11.4–15.2)

## 2020-06-19 LAB — APTT: aPTT: 24 seconds (ref 24–36)

## 2020-06-19 MED ORDER — LACTATED RINGERS IV BOLUS (SEPSIS)
250.0000 mL | Freq: Once | INTRAVENOUS | Status: AC
  Administered 2020-06-19: 250 mL via INTRAVENOUS

## 2020-06-19 MED ORDER — VANCOMYCIN HCL 750 MG/150ML IV SOLN: 750.0000 mg | INTRAVENOUS | Status: DC

## 2020-06-19 MED ORDER — VANCOMYCIN HCL 1250 MG/250ML IV SOLN
1250.0000 mg | Freq: Once | INTRAVENOUS | Status: AC
  Administered 2020-06-19: 1250 mg via INTRAVENOUS
  Filled 2020-06-19: qty 250

## 2020-06-19 MED ORDER — ACETAMINOPHEN 650 MG RE SUPP
650.0000 mg | RECTAL | Status: DC | PRN
  Administered 2020-06-20: 650 mg via RECTAL
  Filled 2020-06-19 (×2): qty 1

## 2020-06-19 MED ORDER — LACTATED RINGERS IV BOLUS (SEPSIS)
500.0000 mL | Freq: Once | INTRAVENOUS | Status: AC
  Administered 2020-06-19: 500 mL via INTRAVENOUS

## 2020-06-19 MED ORDER — METRONIDAZOLE IN NACL 5-0.79 MG/ML-% IV SOLN
500.0000 mg | Freq: Once | INTRAVENOUS | Status: AC
  Administered 2020-06-19: 500 mg via INTRAVENOUS
  Filled 2020-06-19: qty 100

## 2020-06-19 MED ORDER — VANCOMYCIN HCL IN DEXTROSE 1-5 GM/200ML-% IV SOLN
1000.0000 mg | Freq: Once | INTRAVENOUS | Status: DC
  Filled 2020-06-19: qty 200

## 2020-06-19 MED ORDER — LACTATED RINGERS IV BOLUS (SEPSIS)
1000.0000 mL | Freq: Once | INTRAVENOUS | Status: AC
  Administered 2020-06-19: 1000 mL via INTRAVENOUS

## 2020-06-19 MED ORDER — SODIUM CHLORIDE 0.9 % IV SOLN
2.0000 g | INTRAVENOUS | Status: DC
  Administered 2020-06-20: 2 g via INTRAVENOUS
  Filled 2020-06-19: qty 2

## 2020-06-19 MED ORDER — ONDANSETRON HCL 4 MG/2ML IJ SOLN
4.0000 mg | Freq: Once | INTRAMUSCULAR | Status: AC
  Administered 2020-06-19: 4 mg via INTRAVENOUS
  Filled 2020-06-19: qty 2

## 2020-06-19 MED ORDER — SODIUM CHLORIDE 0.9 % IV SOLN
2.0000 g | Freq: Once | INTRAVENOUS | Status: AC
  Administered 2020-06-19: 2 g via INTRAVENOUS
  Filled 2020-06-19: qty 2

## 2020-06-19 MED ORDER — LORAZEPAM 2 MG/ML IJ SOLN
0.5000 mg | Freq: Once | INTRAMUSCULAR | Status: AC
  Administered 2020-06-19: 0.5 mg via INTRAVENOUS
  Filled 2020-06-19: qty 1

## 2020-06-19 MED ORDER — LACTATED RINGERS IV SOLN: INTRAVENOUS | Status: DC

## 2020-06-19 NOTE — Progress Notes (Signed)
Pharmacy Antibiotic Note  Gloria Rowland is a 87 y.o. female admitted on 06/19/2020 with suspected sepsis.  Pharmacy has been consulted for cefepime and vancomycin dosing.  Plan: Cefepime 2 gm IV q 24hr. Vancomycin 1250 mg IV load, then 750 mg IV  q 48 hr. Calc AUC: 469.1 Will follow levels, cultures, renal function, and clinical status.  Weight: 53 kg (116 lb 13.5 oz)  Temp (24hrs), Avg:102.5 F (39.2 C), Min:102.5 F (39.2 C), Max:102.5 F (39.2 C)  Recent Labs  Lab 06/19/20 2050  WBC 19.1*  CREATININE 1.54*  LATICACIDVEN 5.8*    Estimated Creatinine Clearance: 21.9 mL/min (A) (by C-G formula based on SCr of 1.54 mg/dL (H)).    Allergies  Allergen Reactions  . Aspirin Nausea And Vomiting    Upset stomach  . Codeine Nausea And Vomiting    Upset stomach    Antimicrobials this admission: Cefepime 3/30 >>  Vancomycin 3/30 >>  Metronidazole 3/30 >>   Dose adjustments this admission: Cefepime 2 gms IV q 8 hr.  Microbiology results: BCx: pending UCx: pending  Thank you for allowing pharmacy to be a part of this patient's care.  Legrand Pitts, RPh Clinical Pharmacist 06/19/2020 9:52 PM

## 2020-06-19 NOTE — Sepsis Progress Note (Signed)
Following for sepsis monitoring ?

## 2020-06-19 NOTE — ED Provider Notes (Signed)
[facility] EMERGENCY DEPARTMENT Provider Note   CSN: [id number] Arrival date & time: 06/19/20  2011   Level 5 caveat: Altered mental status  History Chief complaint: Altered mental status  Gloria Rowland is a 87 y.o. female.  HPI   Patient presents to the ED for evaluation of altered mental status.  According to the EMS report patient has been more confused today.  She is also had decreased urine output.  Family is not currently present at the bedside to provide any additional history.  Patient's not able to answer any questions for me.  While in the ED the patient did have an episode of vomiting.  She also has been shaking.  Past Medical History:  Diagnosis Date  . Baker's cyst, ruptured   . CAD (coronary artery disease)   . Dementia (HCC)   . History of hepatitis   . Hyperlipidemia   . Hypertension   . Lumbar degenerative disc disease   . Nephrolithiasis   . Spasmodic torticollis     Patient Active Problem List   Diagnosis Date Noted  . Palliative care encounter   . NSTEMI (non-ST elevated myocardial infarction) (HCC) 05/13/2020  . Hypokalemia 05/13/2020  . Chronic kidney disease, stage 3a (HCC) 05/13/2020  . Hyperglycemia 05/13/2020  . Severe protein-calorie malnutrition (HCC) 05/13/2020  . Normocytic anemia 05/13/2020  . Intractable nausea and vomiting 04/10/2018  . Hydronephrosis of left kidney 04/10/2018  . Dementia without behavioral disturbance (HCC) 04/10/2018  . Pain of upper abdomen   . Abdominal pain, epigastric 12/24/2016  . Hiatal hernia 05/27/2011  . CAD (coronary artery disease)   . S/P CABG (coronary artery bypass graft)   . Hyperlipidemia   . Hypertension   . Nephrolithiasis   . Lumbar degenerative disc disease   . Dementia [facility])     Past Surgical History:  Procedure Laterality Date  . ABDOMINAL HYSTERECTOMY    . CARPAL TUNNEL RELEASE    . CHOLECYSTECTOMY    . CORONARY ARTERY BYPASS GRAFT  1998   LIMA to LAD-Dx  .  ESOPHAGOGASTRODUODENOSCOPY N/A 01/15/2017   Procedure: ESOPHAGOGASTRODUODENOSCOPY (EGD);  Surgeon: Malissa Hippo, MD;  Location: [facility] ENDO [facility]ITE;  Service: Endoscopy;  Laterality: N/A;  1:00  . KIDNEY STONE SURGERY    . LEFT HEART CATHETERIZATION WITH CORONARY/GRAFT ANGIOGRAM N/A 05/26/2011   Procedure: LEFT HEART CATHETERIZATION WITH Isabel Caprice;  Surgeon: Othella Boyer, MD;  Location: [facility] CATH LAB;  Service: Cardiovascular;  Laterality: N/A;  . LUMBAR LAMINECTOMY       OB History   No obstetric history on file.     Family History  Problem Relation Age of Onset  . Diabetes Sister   . Diabetes Sister     Social History   Tobacco Use  . Smoking status: Never Smoker  . Smokeless tobacco: Never Used  Vaping Use  . Vaping Use: Never used  Substance Use Topics  . Alcohol use: No  . Drug use: No    Home Medications Prior to Admission medications   Medication Sig Start Date End Date Taking? Authorizing Provider  acetaminophen (TYLENOL) 650 MG CR tablet Take 650 mg by mouth every 6 (six) hours as needed for pain.    [provider]  amLODipine (NORVASC) 10 MG tablet Take 1 tablet (10 mg total) by mouth daily. 05/17/20 08/15/20  Darlin Drop, DO  aspirin EC 81 MG tablet Take 81 mg by mouth daily.    [provider]  citalopram (CELEXA) 40 MG  tablet Take 40 mg by mouth daily. 02/26/20   [provider]  clopidogrel (PLAVIX) 75 MG tablet Take 1 tablet (75 mg total) by mouth daily. 05/17/20 05/12/21  Darlin Drop, DO  Dextromethorphan-guaiFENesin 5-100 MG/5ML LIQD Give 10 ml as needed for cough every 4 hours prn    [provider]  gemfibrozil (LOPID) 600 MG tablet Take 300 mg by mouth 2 (two) times daily before a meal. 02/27/20   [provider]  isosorbide mononitrate (IMDUR) 30 MG 24 hr tablet Take 1 tablet (30 mg total) by mouth daily. 05/17/20 08/15/20  Darlin Drop, DO  lisinopril (ZESTRIL) 20 MG tablet Take 1 tablet (20 mg  total) by mouth daily. 05/17/20 08/15/20  Darlin Drop, DO  memantine (NAMENDA) 10 MG tablet Take 10 mg by mouth 2 (two) times daily.    [provider]  nitroGLYCERIN (NITROSTAT) 0.4 MG SL tablet Place 1 tablet (0.4 mg total) under the tongue every 5 (five) minutes x 3 doses as needed for up to 10 days for chest pain. 05/16/20 05/26/20  Darlin Drop, DO  ondansetron (ZOFRAN ODT) 4 MG disintegrating tablet Take 1 tablet (4 mg total) by mouth every 8 (eight) hours as needed for nausea or vomiting. 04/12/18   Tat, Onalee Hua, MD  pantoprazole (PROTONIX) 40 MG tablet Take 1 tablet (40 mg total) by mouth daily. 04/12/18   Catarina Hartshorn, MD  polyethylene glycol powder (GLYCOLAX/MIRALAX) powder Take 17 g by mouth daily. For constipation. 12/04/16   [provider]  rosuvastatin (CRESTOR) 20 MG tablet Take 1 tablet (20 mg total) by mouth daily. 05/17/20 08/15/20  Darlin Drop, DO  simethicone (MYLICON) 80 MG chewable tablet Chew 1 tablet (80 mg total) by mouth 4 (four) times daily as needed for flatulence. 05/16/20   Darlin Drop, DO  Vitamin D, Ergocalciferol, (DRISDOL) 50000 units CAPS capsule Take 50,000 Units by mouth every 7 (seven) days.    [provider]    Allergies    Aspirin and Codeine  Review of Systems   Review of Systems  Unable to perform ROS: Mental status change  All other systems reviewed and are negative.   Physical Exam Updated Vital Signs BP 109/60   Pulse 76   Temp (!) 102.5 F (39.2 C) (Rectal)   Resp (!) 24   Wt 53 kg   SpO2 95%   BMI 20.06 kg/m   Physical Exam Vitals and nursing note reviewed.  Constitutional:      Appearance: She is well-developed. She is ill-appearing.  HENT:     Head: Normocephalic and atraumatic.     Right Ear: External ear normal.     Left Ear: External ear normal.  Eyes:     General: No scleral icterus.       Right eye: No discharge.        Left eye: No discharge.     Conjunctiva/sclera: Conjunctivae normal.  Neck:      Trachea: No tracheal deviation.  Cardiovascular:     Rate and Rhythm: Normal rate and regular rhythm.  Pulmonary:     Effort: Pulmonary effort is normal. No respiratory distress.     Breath sounds: Normal breath sounds. No stridor. No wheezing or rales.  Abdominal:     General: Bowel sounds are normal. There is no distension.     Palpations: Abdomen is soft.     Tenderness: There is no abdominal tenderness. There is no guarding or rebound.  Musculoskeletal:  General: No tenderness.     Cervical back: Neck supple.     Right lower leg: No edema.     Left lower leg: No edema.  Skin:    General: Skin is warm and dry.     Findings: No rash.  Neurological:     Mental Status: She is alert.     GCS: GCS eye subscore is 1. GCS verbal subscore is 2. GCS motor subscore is 5.     Cranial Nerves: No cranial nerve deficit (no facial droop, extraocular movements intact, patient is mumbling, unintelligible).     Motor: Tremor and abnormal muscle tone present.     Comments: Patient is not answering questions, she is mumbling, and am unable to understand her, patient does have diffuse tremors, questionable rigors     ED Results / Procedures / Treatments   Labs (all labs ordered are listed, but only abnormal results are displayed) Labs Reviewed  COMPREHENSIVE METABOLIC PANEL - Abnormal; Notable for the following components:      Result Value   CO2 17 (*)    Glucose, Bld 244 (*)    BUN 32 (*)    Creatinine, Ser 1.54 (*)    GFR, Estimated 33 (*)    Anion gap 17 (*)    All other components within normal limits  LACTIC ACID, PLASMA - Abnormal; Notable for the following components:   Lactic Acid, Venous 5.8 (*)    All other components within normal limits  LACTIC ACID, PLASMA - Abnormal; Notable for the following components:   Lactic Acid, Venous 2.3 (*)    All other components within normal limits  CBC WITH DIFFERENTIAL/PLATELET - Abnormal; Notable for the following components:    WBC 19.1 (*)    RBC 3.44 (*)    Hemoglobin 10.6 (*)    HCT 33.0 (*)    Neutro Abs 16.4 (*)    Abs Immature Granulocytes 0.10 (*)    All other components within normal limits  URINALYSIS, ROUTINE W REFLEX MICROSCOPIC - Abnormal; Notable for the following components:   APPearance CLOUDY (*)    Hgb urine dipstick LARGE (*)    Protein, ur 100 (*)    Leukocytes,Ua LARGE (*)    RBC / HPF >50 (*)    WBC, UA >50 (*)    Bacteria, UA FEW (*)    All other components within normal limits  CULTURE, BLOOD (ROUTINE X 2)  RESP PANEL BY RT-PCR (FLU A&B, COVID) [id number]  CULTURE, BLOOD (ROUTINE X 2)  URINE CULTURE  PROTIME-INR  APTT    EKG EKG Interpretation  Date/Time:  Wednesday June 19 2020 22:04:28 EDT Ventricular Rate:  81 PR Interval:  38 QRS Duration: 90 QT Interval:  459 QTC Calculation: 533 R Axis:   30 Text Interpretation: Sinus or ectopic atrial rhythm Atrial premature complex Short PR interval Anteroseptal infarct, age indeterminate Prolonged QT interval Artifact in lead(s) I II III aVR aVL aVF V1 Poor data quality Confirmed by Linwood DibblesKnapp, Lexander Tremblay [phone]([id number]) on 06/19/2020 10:17:12 PM   Radiology CT Head Wo Contrast  Result Date: 06/19/2020 CLINICAL DATA:  Mental status change fever EXAM: CT HEAD WITHOUT CONTRAST TECHNIQUE: Contiguous axial images were obtained from the base of the skull through the vertex without intravenous contrast. COMPARISON:  CT brain 05/01/2017 FINDINGS: Brain: No acute territorial infarction, hemorrhage or intracranial mass. Atrophy and mild chronic small vessel ischemic changes of the white matter. Stable ventricle size. Vascular: No hyperdense vessels.  Carotid vascular calcification. Skull: Normal. Negative  for fracture or focal lesion. Sinuses/Orbits: No acute finding. Other: None IMPRESSION: 1. No CT evidence for acute intracranial abnormality. 2. Atrophy and mild chronic small vessel ischemic changes of the white matter. Electronically Signed   By: Jasmine Pang M.D.    On: 06/19/2020 22:03   DG Chest Port 1 View  Result Date: 06/19/2020 CLINICAL DATA:  Fever EXAM: PORTABLE CHEST 1 VIEW COMPARISON:  05/12/2020 FINDINGS: Post sternotomy changes. No focal opacity or pleural effusion. Aortic atherosclerosis. Normal cardiac size. Scoliosis of the spine. IMPRESSION: No active disease. Electronically Signed   By: Jasmine Pang M.D.   On: 06/19/2020 21:11    Procedures .Critical Care Performed by: Linwood Dibbles, MD Authorized by: Linwood Dibbles, MD   Critical care provider statement:    Critical care time (minutes):  45   Critical care was time spent personally by me on the following activities:  Discussions with consultants, evaluation of patient's response to treatment, examination of patient, ordering and performing treatments and interventions, ordering and review of laboratory studies, ordering and review of radiographic studies, pulse oximetry, re-evaluation of patient's condition, obtaining history from patient or surrogate and review of old charts     Medications Ordered in ED Medications  acetaminophen (TYLENOL) suppository 650 mg (has no administration in time range)  lactated ringers infusion ( Intravenous New Bag/Given 06/19/20 2228)  vancomycin (VANCOREADY) IVPB 1250 mg/250 mL (1,250 mg Intravenous New Bag/Given 06/19/20 2238)  ceFEPIme (MAXIPIME) 2 g in sodium chloride 0.9 % 100 mL IVPB (has no administration in time range)  vancomycin (VANCOREADY) IVPB 750 mg/150 mL (has no administration in time range)  lactated ringers bolus 1,000 mL (0 mLs Intravenous Stopped 06/19/20 2151)    And  lactated ringers bolus 500 mL (0 mLs Intravenous Stopped 06/19/20 2227)    And  lactated ringers bolus 250 mL (0 mLs Intravenous Stopped 06/19/20 2227)  ceFEPIme (MAXIPIME) 2 g in sodium chloride 0.9 % 100 mL IVPB (0 g Intravenous Stopped 06/19/20 2152)  metroNIDAZOLE (FLAGYL) IVPB 500 mg (0 mg Intravenous Stopped 06/19/20 2302)  LORazepam (ATIVAN) injection 0.5 mg (0.5 mg  Intravenous Given 06/19/20 2111)  ondansetron (ZOFRAN) injection 4 mg (4 mg Intravenous Given 06/19/20 2112)    ED Course  I have reviewed the triage vital signs and the nursing notes.  Pertinent labs & imaging results that were available during my care of the patient were reviewed by me and considered in my medical decision making (see chart for details).  Clinical Course as of 06/19/20 2309  Wed Jun 19, 2020  2100 Patient noted to have fever 102.5.  We will proceed with ED work-up.   [JK]  2158 Lactic acid significantly elevated.  Patient has been started on sepsis protocol. [JK]  2158 Creatinine elevated compared to recent.  White blood cell count is elevated [JK]  2158 Urinalysis is suggestive of acute infection [JK]    Clinical Course User Index [JK] Linwood Dibbles, MD   MDM Rules/Calculators/A&P                          Patient presented to the ED for evaluation of fever and confusion.  In the ED she did have a temperature 102.5.  She also has elevated lactic acid level.  White count is elevated 19.  Alysis is consistent with urinary tract infection.  Acute findings noted on chest x-ray or head CT.  Patient has been started on broad-spectrum antibiotics.  IV fluid boluses have  been ordered.  Blood pressure has remained stable.  I will consult medical service for admission and further treatment. Final Clinical Impression(s) / ED Diagnoses Final diagnoses:  Sepsis, due to unspecified organism, unspecified whether acute organ dysfunction present [facility])      Linwood Dibbles, MD 06/19/20 2310

## 2020-06-19 NOTE — ED Triage Notes (Signed)
Per family pt has been more altered today and skin warm to touch. Per family pt has had little urine output today.

## 2020-06-19 NOTE — ED Notes (Signed)
Date and time results received: 06/19/20 2150 (use smartphrase ".now" to insert current time)  Test: lactic acid Critical Value: 5.8  Name of Provider Notified: Dr Roselyn Bering  Orders Received? Or Actions Taken?: Actions Taken: no orders received.

## 2020-06-20 ENCOUNTER — Encounter (HOSPITAL_COMMUNITY): Payer: Self-pay | Admitting: Family Medicine

## 2020-06-20 DIAGNOSIS — R739 Hyperglycemia, unspecified: Secondary | ICD-10-CM | POA: Diagnosis present

## 2020-06-20 DIAGNOSIS — N390 Urinary tract infection, site not specified: Secondary | ICD-10-CM

## 2020-06-20 DIAGNOSIS — M5136 Other intervertebral disc degeneration, lumbar region: Secondary | ICD-10-CM | POA: Diagnosis present

## 2020-06-20 DIAGNOSIS — A401 Sepsis due to streptococcus, group B: Secondary | ICD-10-CM | POA: Diagnosis present

## 2020-06-20 DIAGNOSIS — Z87442 Personal history of urinary calculi: Secondary | ICD-10-CM | POA: Diagnosis not present

## 2020-06-20 DIAGNOSIS — Z515 Encounter for palliative care: Secondary | ICD-10-CM

## 2020-06-20 DIAGNOSIS — E872 Acidosis, unspecified: Secondary | ICD-10-CM | POA: Diagnosis present

## 2020-06-20 DIAGNOSIS — Z7902 Long term (current) use of antithrombotics/antiplatelets: Secondary | ICD-10-CM | POA: Diagnosis not present

## 2020-06-20 DIAGNOSIS — F0390 Unspecified dementia without behavioral disturbance: Secondary | ICD-10-CM | POA: Diagnosis present

## 2020-06-20 DIAGNOSIS — R262 Difficulty in walking, not elsewhere classified: Secondary | ICD-10-CM | POA: Diagnosis not present

## 2020-06-20 DIAGNOSIS — N179 Acute kidney failure, unspecified: Secondary | ICD-10-CM | POA: Diagnosis present

## 2020-06-20 DIAGNOSIS — I2510 Atherosclerotic heart disease of native coronary artery without angina pectoris: Secondary | ICD-10-CM | POA: Diagnosis present

## 2020-06-20 DIAGNOSIS — A419 Sepsis, unspecified organism: Secondary | ICD-10-CM | POA: Diagnosis present

## 2020-06-20 DIAGNOSIS — K219 Gastro-esophageal reflux disease without esophagitis: Secondary | ICD-10-CM | POA: Diagnosis not present

## 2020-06-20 DIAGNOSIS — R410 Disorientation, unspecified: Secondary | ICD-10-CM | POA: Diagnosis not present

## 2020-06-20 DIAGNOSIS — R6520 Severe sepsis without septic shock: Secondary | ICD-10-CM | POA: Diagnosis not present

## 2020-06-20 DIAGNOSIS — E785 Hyperlipidemia, unspecified: Secondary | ICD-10-CM | POA: Diagnosis present

## 2020-06-20 DIAGNOSIS — B951 Streptococcus, group B, as the cause of diseases classified elsewhere: Secondary | ICD-10-CM | POA: Diagnosis not present

## 2020-06-20 DIAGNOSIS — N1831 Chronic kidney disease, stage 3a: Secondary | ICD-10-CM | POA: Diagnosis present

## 2020-06-20 DIAGNOSIS — K449 Diaphragmatic hernia without obstruction or gangrene: Secondary | ICD-10-CM | POA: Diagnosis not present

## 2020-06-20 DIAGNOSIS — Z886 Allergy status to analgesic agent status: Secondary | ICD-10-CM | POA: Diagnosis not present

## 2020-06-20 DIAGNOSIS — I252 Old myocardial infarction: Secondary | ICD-10-CM | POA: Diagnosis not present

## 2020-06-20 DIAGNOSIS — R1312 Dysphagia, oropharyngeal phase: Secondary | ICD-10-CM | POA: Diagnosis not present

## 2020-06-20 DIAGNOSIS — Z7982 Long term (current) use of aspirin: Secondary | ICD-10-CM | POA: Diagnosis not present

## 2020-06-20 DIAGNOSIS — M6281 Muscle weakness (generalized): Secondary | ICD-10-CM | POA: Diagnosis not present

## 2020-06-20 DIAGNOSIS — I10 Essential (primary) hypertension: Secondary | ICD-10-CM | POA: Diagnosis not present

## 2020-06-20 DIAGNOSIS — Z20822 Contact with and (suspected) exposure to covid-19: Secondary | ICD-10-CM | POA: Diagnosis present

## 2020-06-20 DIAGNOSIS — A409 Streptococcal sepsis, unspecified: Secondary | ICD-10-CM | POA: Diagnosis not present

## 2020-06-20 DIAGNOSIS — G243 Spasmodic torticollis: Secondary | ICD-10-CM | POA: Diagnosis present

## 2020-06-20 DIAGNOSIS — N200 Calculus of kidney: Secondary | ICD-10-CM | POA: Diagnosis not present

## 2020-06-20 DIAGNOSIS — I129 Hypertensive chronic kidney disease with stage 1 through stage 4 chronic kidney disease, or unspecified chronic kidney disease: Secondary | ICD-10-CM | POA: Diagnosis present

## 2020-06-20 DIAGNOSIS — Z79899 Other long term (current) drug therapy: Secondary | ICD-10-CM | POA: Diagnosis not present

## 2020-06-20 DIAGNOSIS — Z951 Presence of aortocoronary bypass graft: Secondary | ICD-10-CM | POA: Diagnosis not present

## 2020-06-20 DIAGNOSIS — G9341 Metabolic encephalopathy: Secondary | ICD-10-CM | POA: Diagnosis present

## 2020-06-20 DIAGNOSIS — R2681 Unsteadiness on feet: Secondary | ICD-10-CM | POA: Diagnosis not present

## 2020-06-20 DIAGNOSIS — K759 Inflammatory liver disease, unspecified: Secondary | ICD-10-CM | POA: Diagnosis present

## 2020-06-20 DIAGNOSIS — Z66 Do not resuscitate: Secondary | ICD-10-CM | POA: Diagnosis present

## 2020-06-20 LAB — CBC WITH DIFFERENTIAL/PLATELET
Abs Immature Granulocytes: 0.16 10*3/uL — ABNORMAL HIGH (ref 0.00–0.07)
Basophils Absolute: 0 10*3/uL (ref 0.0–0.1)
Basophils Relative: 0 %
Eosinophils Absolute: 0 10*3/uL (ref 0.0–0.5)
Eosinophils Relative: 0 %
HCT: 29.9 % — ABNORMAL LOW (ref 36.0–46.0)
Hemoglobin: 9.5 g/dL — ABNORMAL LOW (ref 12.0–15.0)
Immature Granulocytes: 1 %
Lymphocytes Relative: 3 %
Lymphs Abs: 0.4 10*3/uL — ABNORMAL LOW (ref 0.7–4.0)
MCH: 30.9 pg (ref 26.0–34.0)
MCHC: 31.8 g/dL (ref 30.0–36.0)
MCV: 97.4 fL (ref 80.0–100.0)
Monocytes Absolute: 0.9 10*3/uL (ref 0.1–1.0)
Monocytes Relative: 5 %
Neutro Abs: 14.7 10*3/uL — ABNORMAL HIGH (ref 1.7–7.7)
Neutrophils Relative %: 91 %
Platelets: 160 10*3/uL (ref 150–400)
RBC: 3.07 MIL/uL — ABNORMAL LOW (ref 3.87–5.11)
RDW: 13.2 % (ref 11.5–15.5)
WBC: 16.2 10*3/uL — ABNORMAL HIGH (ref 4.0–10.5)
nRBC: 0 % (ref 0.0–0.2)

## 2020-06-20 LAB — BLOOD CULTURE ID PANEL (REFLEXED) - BCID2

## 2020-06-20 LAB — COMPREHENSIVE METABOLIC PANEL
ALT: 19 U/L (ref 0–44)
AST: 37 U/L (ref 15–41)
Albumin: 3.2 g/dL — ABNORMAL LOW (ref 3.5–5.0)
Alkaline Phosphatase: 57 U/L (ref 38–126)
Anion gap: 10 (ref 5–15)
BUN: 27 mg/dL — ABNORMAL HIGH (ref 8–23)
CO2: 21 mmol/L — ABNORMAL LOW (ref 22–32)
Calcium: 8.2 mg/dL — ABNORMAL LOW (ref 8.9–10.3)
Chloride: 107 mmol/L (ref 98–111)
Creatinine, Ser: 1.29 mg/dL — ABNORMAL HIGH (ref 0.44–1.00)
GFR, Estimated: 40 mL/min — ABNORMAL LOW (ref >60)
Glucose, Bld: 165 mg/dL — ABNORMAL HIGH (ref 70–99)
Potassium: 3.6 mmol/L (ref 3.5–5.1)
Sodium: 138 mmol/L (ref 135–145)
Total Bilirubin: 0.8 mg/dL (ref 0.3–1.2)
Total Protein: 6.4 g/dL — ABNORMAL LOW (ref 6.5–8.1)

## 2020-06-20 LAB — CORTISOL-AM, BLOOD: Cortisol - AM: 27.6 ug/dL — ABNORMAL HIGH (ref 6.7–22.6)

## 2020-06-20 LAB — MRSA PCR SCREENING: MRSA by PCR: NEGATIVE

## 2020-06-20 LAB — PROCALCITONIN: Procalcitonin: 88.07 ng/mL

## 2020-06-20 LAB — PROTIME-INR
INR: 1.3 — ABNORMAL HIGH (ref 0.8–1.2)
Prothrombin Time: 15.9 seconds — ABNORMAL HIGH (ref 11.4–15.2)

## 2020-06-20 LAB — MAGNESIUM: Magnesium: 1.8 mg/dL (ref 1.7–2.4)

## 2020-06-20 LAB — LACTIC ACID, PLASMA: Lactic Acid, Venous: 1.3 mmol/L (ref 0.5–1.9)

## 2020-06-20 MED ORDER — SODIUM CHLORIDE 0.9 % IV SOLN: INTRAVENOUS | Status: DC

## 2020-06-20 MED ORDER — CLOPIDOGREL BISULFATE 75 MG PO TABS
75.0000 mg | ORAL_TABLET | Freq: Every day | ORAL | Status: DC
  Administered 2020-06-22 – 2020-06-24 (×3): 75 mg via ORAL
  Filled 2020-06-20 (×3): qty 1

## 2020-06-20 MED ORDER — ORAL CARE MOUTH RINSE
15.0000 mL | Freq: Two times a day (BID) | OROMUCOSAL | Status: DC
  Administered 2020-06-21 – 2020-06-24 (×7): 15 mL via OROMUCOSAL

## 2020-06-20 MED ORDER — ONDANSETRON HCL 4 MG PO TABS: 4.0000 mg | ORAL_TABLET | Freq: Four times a day (QID) | ORAL | Status: DC | PRN

## 2020-06-20 MED ORDER — POLYETHYLENE GLYCOL 3350 17 G PO PACK
17.0000 g | PACK | Freq: Every day | ORAL | Status: DC
  Administered 2020-06-22 – 2020-06-24 (×3): 17 g via ORAL
  Filled 2020-06-20 (×3): qty 1

## 2020-06-20 MED ORDER — CITALOPRAM HYDROBROMIDE 20 MG PO TABS
40.0000 mg | ORAL_TABLET | Freq: Every day | ORAL | Status: DC
  Administered 2020-06-22 – 2020-06-24 (×3): 40 mg via ORAL
  Filled 2020-06-20 (×3): qty 2

## 2020-06-20 MED ORDER — ROSUVASTATIN CALCIUM 20 MG PO TABS: 20.0000 mg | ORAL_TABLET | Freq: Every day | ORAL | Status: DC

## 2020-06-20 MED ORDER — HEPARIN SODIUM (PORCINE) 5000 UNIT/ML IJ SOLN
5000.0000 [IU] | Freq: Three times a day (TID) | INTRAMUSCULAR | Status: DC
  Administered 2020-06-20 – 2020-06-24 (×14): 5000 [IU] via SUBCUTANEOUS
  Filled 2020-06-20 (×14): qty 1

## 2020-06-20 MED ORDER — ROSUVASTATIN CALCIUM 10 MG PO TABS
10.0000 mg | ORAL_TABLET | Freq: Every evening | ORAL | Status: DC
  Administered 2020-06-21 – 2020-06-23 (×3): 10 mg via ORAL
  Filled 2020-06-20 (×3): qty 1

## 2020-06-20 MED ORDER — CHLORHEXIDINE GLUCONATE CLOTH 2 % EX PADS
6.0000 | MEDICATED_PAD | Freq: Every day | CUTANEOUS | Status: DC
  Administered 2020-06-20 – 2020-06-24 (×5): 6 via TOPICAL

## 2020-06-20 MED ORDER — AMLODIPINE BESYLATE 5 MG PO TABS
10.0000 mg | ORAL_TABLET | Freq: Every day | ORAL | Status: DC
  Administered 2020-06-22 – 2020-06-24 (×3): 10 mg via ORAL
  Filled 2020-06-20 (×3): qty 2

## 2020-06-20 MED ORDER — MEMANTINE HCL 10 MG PO TABS
10.0000 mg | ORAL_TABLET | Freq: Two times a day (BID) | ORAL | Status: DC
Start: 2020-06-20 — End: 2020-06-24
  Administered 2020-06-21 – 2020-06-24 (×6): 10 mg via ORAL
  Filled 2020-06-20 (×6): qty 1

## 2020-06-20 MED ORDER — ONDANSETRON HCL 4 MG/2ML IJ SOLN
4.0000 mg | Freq: Four times a day (QID) | INTRAMUSCULAR | Status: DC | PRN
  Administered 2020-06-22: 4 mg via INTRAVENOUS
  Filled 2020-06-20: qty 2

## 2020-06-20 MED ORDER — ASPIRIN EC 81 MG PO TBEC
81.0000 mg | DELAYED_RELEASE_TABLET | Freq: Every day | ORAL | Status: DC
  Administered 2020-06-22 – 2020-06-24 (×3): 81 mg via ORAL
  Filled 2020-06-20 (×3): qty 1

## 2020-06-20 MED ORDER — SODIUM CHLORIDE 0.9 % IV BOLUS
250.0000 mL | Freq: Once | INTRAVENOUS | Status: AC
  Administered 2020-06-20: 250 mL via INTRAVENOUS

## 2020-06-20 NOTE — Progress Notes (Signed)
ASSUMPTION OF CARE NOTE   06/20/2020 1:33 PM  Gloria Rowland was seen and examined.  The H&P by the admitting provider, orders, imaging was reviewed.  Pt remains obtunded.  ACP documents reviewed but are confusing to me.  Will ask for palliative for goals of care.  Continue supportive measures.  BC 1/4 positive.  Continue current management and antibiotics for now.  Please see new orders.  Will continue to follow.   Vitals:   06/20/20 1000 06/20/20 1126  BP: (!) 125/56   Pulse: 76 76  Resp: (!) 28 (!) 28  Temp:  (!) 100.7 F (38.2 C)  SpO2: 93% 93%    Results for orders placed or performed during the hospital encounter of 06/19/20  Culture, blood (Routine x 2)   Specimen: BLOOD RIGHT FOREARM  Result Value Ref Range   Specimen Description      BLOOD RIGHT FOREARM Performed at [facility], [address], [place], [place] [address]    Special Requests      BOTTLES DRAWN AEROBIC AND ANAEROBIC Blood Culture adequate volume Performed at [facility], [address] [place], [place] [address]    Culture  Setup Time      GRAM POSITIVE COCCI AEROBIC AND ANAEROBIC BOTTLES Gram Stain Report Called to,Read Back By and Verified With: J. HEARN 3/31 @ 0639 BY S. BEARD Organism ID to follow CRITICAL RESULT CALLED TO, READ BACK BY AND VERIFIED WITH: PharmD G Coffee [id number] Performed at [facility], [address] [place], [place] [address]    Culture GRAM POSITIVE COCCI    Report Status PENDING   Culture, blood (Routine x 2)   Specimen: Site Not Specified; Blood  Result Value Ref Range   Specimen Description SITE NOT SPECIFIED    Special Requests      BOTTLES DRAWN AEROBIC AND ANAEROBIC Blood Culture adequate volume   Culture      NO GROWTH < 12 HOURS Performed at [facility], [address] [place], [place] [address]    Report Status PENDING   Resp Panel by RT-PCR (Flu A&B, Covid) Urine, Catheterized   Specimen: Urine, Catheterized; Nasopharyngeal(NP) swabs in  vial transport medium  Result Value Ref Range   SARS Coronavirus 2 by RT PCR NEGATIVE NEGATIVE   Influenza A by PCR NEGATIVE NEGATIVE   Influenza B by PCR NEGATIVE NEGATIVE  MRSA PCR Screening   Specimen: Nasal Mucosa; Nasopharyngeal  Result Value Ref Range   MRSA by PCR NEGATIVE NEGATIVE  Blood Culture ID Panel (Reflexed)  Result Value Ref Range   Enterococcus faecalis NOT DETECTED NOT DETECTED   Enterococcus Faecium NOT DETECTED NOT DETECTED   Listeria monocytogenes NOT DETECTED NOT DETECTED   Staphylococcus species NOT DETECTED NOT DETECTED   Staphylococcus aureus (BCID) NOT DETECTED NOT DETECTED   Staphylococcus epidermidis NOT DETECTED NOT DETECTED   Staphylococcus lugdunensis NOT DETECTED NOT DETECTED   Streptococcus species DETECTED (A) NOT DETECTED   Streptococcus agalactiae NOT DETECTED NOT DETECTED   Streptococcus pneumoniae NOT DETECTED NOT DETECTED   Streptococcus pyogenes NOT DETECTED NOT DETECTED   A.calcoaceticus-baumannii NOT DETECTED NOT DETECTED   Bacteroides fragilis NOT DETECTED NOT DETECTED   Enterobacterales NOT DETECTED NOT DETECTED   Enterobacter cloacae complex NOT DETECTED NOT DETECTED   Escherichia coli NOT DETECTED NOT DETECTED   Klebsiella aerogenes NOT DETECTED NOT DETECTED   Klebsiella oxytoca NOT DETECTED NOT DETECTED   Klebsiella pneumoniae NOT DETECTED NOT DETECTED   Proteus species NOT  DETECTED NOT DETECTED   Salmonella species NOT DETECTED NOT DETECTED   Serratia marcescens NOT DETECTED NOT DETECTED   Haemophilus influenzae NOT DETECTED NOT DETECTED   Neisseria meningitidis NOT DETECTED NOT DETECTED   Pseudomonas aeruginosa NOT DETECTED NOT DETECTED   Stenotrophomonas maltophilia NOT DETECTED NOT DETECTED   Candida albicans NOT DETECTED NOT DETECTED   Candida auris NOT DETECTED NOT DETECTED   Candida glabrata NOT DETECTED NOT DETECTED   Candida krusei NOT DETECTED NOT DETECTED   Candida parapsilosis NOT DETECTED NOT DETECTED   Candida  tropicalis NOT DETECTED NOT DETECTED   Cryptococcus neoformans/gattii NOT DETECTED NOT DETECTED  Comprehensive metabolic panel  Result Value Ref Range   Sodium 135 135 - 145 mmol/L   Potassium 4.2 3.5 - 5.1 mmol/L   Chloride 101 98 - 111 mmol/L   CO2 17 (L) 22 - 32 mmol/L   Glucose, Bld 244 (H) 70 - 99 mg/dL   BUN 32 (H) 8 - 23 mg/dL   Creatinine, Ser 0.86 (H) 0.44 - 1.00 mg/dL   Calcium 8.9 8.9 - 76.1 mg/dL   Total Protein 7.6 6.5 - 8.1 g/dL   Albumin 4.1 3.5 - 5.0 g/dL   AST 24 15 - 41 U/L   ALT 13 0 - 44 U/L   Alkaline Phosphatase 68 38 - 126 U/L   Total Bilirubin 0.9 0.3 - 1.2 mg/dL   GFR, Estimated 33 (L) >60 mL/min   Anion gap 17 (H) 5 - 15  Lactic acid, plasma  Result Value Ref Range   Lactic Acid, Venous 5.8 (HH) 0.5 - 1.9 mmol/L  Lactic acid, plasma  Result Value Ref Range   Lactic Acid, Venous 2.3 (HH) 0.5 - 1.9 mmol/L  CBC with Differential  Result Value Ref Range   WBC 19.1 (H) 4.0 - 10.5 K/uL   RBC 3.44 (L) 3.87 - 5.11 MIL/uL   Hemoglobin 10.6 (L) 12.0 - 15.0 g/dL   HCT 95.0 (L) 93.2 - 67.1 %   MCV 95.9 80.0 - 100.0 fL   MCH 30.8 26.0 - 34.0 pg   MCHC 32.1 30.0 - 36.0 g/dL   RDW 24.5 80.9 - 98.3 %   Platelets 262 150 - 400 K/uL   nRBC 0.0 0.0 - 0.2 %   Neutrophils Relative % 86 %   Neutro Abs 16.4 (H) 1.7 - 7.7 K/uL   Lymphocytes Relative 12 %   Lymphs Abs 2.3 0.7 - 4.0 K/uL   Monocytes Relative 1 %   Monocytes Absolute 0.3 0.1 - 1.0 K/uL   Eosinophils Relative 0 %   Eosinophils Absolute 0.0 0.0 - 0.5 K/uL   Basophils Relative 0 %   Basophils Absolute 0.0 0.0 - 0.1 K/uL   Immature Granulocytes 1 %   Abs Immature Granulocytes 0.10 (H) 0.00 - 0.07 K/uL  Protime-INR  Result Value Ref Range   Prothrombin Time 14.6 11.4 - 15.2 seconds   INR 1.2 0.8 - 1.2  Urinalysis, Routine w reflex microscopic Urine, Catheterized  Result Value Ref Range   Color, Urine YELLOW YELLOW   APPearance CLOUDY (A) CLEAR   Specific Gravity, Urine 1.019 1.005 - 1.030   pH 5.0  5.0 - 8.0   Glucose, UA NEGATIVE NEGATIVE mg/dL   Hgb urine dipstick LARGE (A) NEGATIVE   Bilirubin Urine NEGATIVE NEGATIVE   Ketones, ur NEGATIVE NEGATIVE mg/dL   Protein, ur 382 (A) NEGATIVE mg/dL   Nitrite NEGATIVE NEGATIVE   Leukocytes,Ua LARGE (A) NEGATIVE   RBC / HPF >50 (  H) 0 - 5 RBC/hpf   WBC, UA >50 (H) 0 - 5 WBC/hpf   Bacteria, UA FEW (A) NONE SEEN   Squamous Epithelial / LPF 6-10 0 - 5   Mucus PRESENT    Hyaline Casts, UA PRESENT   APTT  Result Value Ref Range   aPTT 24 24 - 36 seconds  Protime-INR  Result Value Ref Range   Prothrombin Time 15.9 (H) 11.4 - 15.2 seconds   INR 1.3 (H) 0.8 - 1.2  Cortisol-am, blood  Result Value Ref Range   Cortisol - AM 27.6 (H) 6.7 - 22.6 ug/dL  Procalcitonin  Result Value Ref Range   Procalcitonin 88.07 ng/mL  Comprehensive metabolic panel  Result Value Ref Range   Sodium 138 135 - 145 mmol/L   Potassium 3.6 3.5 - 5.1 mmol/L   Chloride 107 98 - 111 mmol/L   CO2 21 (L) 22 - 32 mmol/L   Glucose, Bld 165 (H) 70 - 99 mg/dL   BUN 27 (H) 8 - 23 mg/dL   Creatinine, Ser 4.88 (H) 0.44 - 1.00 mg/dL   Calcium 8.2 (L) 8.9 - 10.3 mg/dL   Total Protein 6.4 (L) 6.5 - 8.1 g/dL   Albumin 3.2 (L) 3.5 - 5.0 g/dL   AST 37 15 - 41 U/L   ALT 19 0 - 44 U/L   Alkaline Phosphatase 57 38 - 126 U/L   Total Bilirubin 0.8 0.3 - 1.2 mg/dL   GFR, Estimated 40 (L) >60 mL/min   Anion gap 10 5 - 15  Magnesium  Result Value Ref Range   Magnesium 1.8 1.7 - 2.4 mg/dL  CBC WITH DIFFERENTIAL  Result Value Ref Range   WBC 16.2 (H) 4.0 - 10.5 K/uL   RBC 3.07 (L) 3.87 - 5.11 MIL/uL   Hemoglobin 9.5 (L) 12.0 - 15.0 g/dL   HCT 89.1 (L) 69.4 - 50.3 %   MCV 97.4 80.0 - 100.0 fL   MCH 30.9 26.0 - 34.0 pg   MCHC 31.8 30.0 - 36.0 g/dL   RDW 88.8 28.0 - 03.4 %   Platelets 160 150 - 400 K/uL   nRBC 0.0 0.0 - 0.2 %   Neutrophils Relative % 91 %   Neutro Abs 14.7 (H) 1.7 - 7.7 K/uL   Lymphocytes Relative 3 %   Lymphs Abs 0.4 (L) 0.7 - 4.0 K/uL   Monocytes  Relative 5 %   Monocytes Absolute 0.9 0.1 - 1.0 K/uL   Eosinophils Relative 0 %   Eosinophils Absolute 0.0 0.0 - 0.5 K/uL   Basophils Relative 0 %   Basophils Absolute 0.0 0.0 - 0.1 K/uL   Immature Granulocytes 1 %   Abs Immature Granulocytes 0.16 (H) 0.00 - 0.07 K/uL  Lactic acid, plasma  Result Value Ref Range   Lactic Acid, Venous 1.3 0.5 - 1.9 mmol/L     C. Laural Benes, MD [facility]   06/19/2020  8:12 PM How to contact the [facility] Attending or Consulting provider 7A - 7P or covering provider during after hours 7P -7A, for this patient?  1. Check the care team in [facility] and look for a) attending/consulting [facility] provider listed and b) the [facility] team listed 2. Log into [email or web address] and use [facility] universal password to access. If you do not have the password, please contact the hospital operator. 3. Locate the [facility] provider you are looking for under [facility] and page to a number that you can be directly reached. 4. If you still have  difficulty reaching the provider, please page the [facility] (Director on Call) for the Hospitalists listed on amion for assistance.

## 2020-06-20 NOTE — Evaluation (Signed)
Clinical/Bedside Swallow Evaluation Patient Details  Name: Gloria Rowland MRN: [id number] Date of Birth: 07-24-1932  Today's Date: 06/20/2020 Time: SLP Start Time (ACUTE ONLY): 1332 SLP Stop Time (ACUTE ONLY): 1352 SLP Time Calculation (min) (ACUTE ONLY): 20 min  Past Medical History:  Past Medical History:  Diagnosis Date  . Baker's cyst, ruptured   . CAD (coronary artery disease)   . Dementia (HCC)   . History of hepatitis   . Hyperlipidemia   . Hypertension   . Lumbar degenerative disc disease   . Nephrolithiasis   . Spasmodic torticollis    Past Surgical History:  Past Surgical History:  Procedure Laterality Date  . ABDOMINAL HYSTERECTOMY    . CARPAL TUNNEL RELEASE    . CHOLECYSTECTOMY    . CORONARY ARTERY BYPASS GRAFT  1998   LIMA to LAD-Dx  . ESOPHAGOGASTRODUODENOSCOPY N/A 01/15/2017   Procedure: ESOPHAGOGASTRODUODENOSCOPY (EGD);  Surgeon: Malissa Hippo, MD;  Location: [facility];  Service: Endoscopy;  Laterality: N/A;  1:00  . KIDNEY STONE SURGERY    . LEFT HEART CATHETERIZATION WITH CORONARY/GRAFT ANGIOGRAM N/A 05/26/2011   Procedure: LEFT HEART CATHETERIZATION WITH Isabel Caprice;  Surgeon: Othella Boyer, MD;  Location: [facility] CATH LAB;  Service: Cardiovascular;  Laterality: N/A;  . LUMBAR LAMINECTOMY     HPI:  Gloria Rowland  is a 87 y.o. female, with history of spasmodic torticollis, nephrolithiasis, hypertension, hyperlipidemia, history of hepatitis, dementia, coronary artery disease, and more presents to the ED with a chief complaint of altered mental status.  Unfortunately patient is nonverbal at this time and any history, report and chart review patient is normally able to hold a clear conversation.  She does have signs of dementia with forgetfulness, but is usually alert and oriented x3.  Today patient was confused, and had decreased urine output and so they called EMS.  During her time in the ED she did have an episode of vomiting.  Patient also had  a fever in the ED temperature of 102.5, for which she was given Tylenol.  She was tachycardic up to 118, tachypneic up to 24, had soft pressures as low as 95/51, and a white blood cell count of 19.1.  Her initial lactic acid was 5.8, and improved to 2.3 after 1.750 L of lactated Ringer's.  Patient was initially started on broad-spectrum antibiotics with cefepime, Vanco, Flagyl.  Her UA then came back very consistent with UTI.  Urine culture was ordered.  Blood cultures were also ordered.  Head CT was done that showed no evidence for acute intracranial abnormality.  Chest x-ray was done that showed no active disease. BSE requested.   Assessment / Plan / Recommendation Clinical Impression  Clinical swallow evaluation completed, however study was limited due to Pt lethargy. SLP increased HOB and completed oral care; Pt minimally responsive. Pt did close her lips around ice chip to lips and then began to manipulate ice chip in her oral cavity. Pt with delayed swallow response and with seemingly reduced hyolaryngeal excursion. Pt sucked some moisture/water off the toothette in her mouth which elicited delayed swallow and immediate cough. Recommend continue NPO due to lethargy and continue with oral care for comfort. SLP will check back for appropriateness to participate tomorrow. Above to RN. SLP Visit Diagnosis: Dysphagia, unspecified (R13.10)    Aspiration Risk  Moderate aspiration risk;Risk for inadequate nutrition/hydration    Diet Recommendation NPO   Medication Administration: Via alternative means    Other  Recommendations     Follow  up Recommendations 24 hour supervision/assistance      Frequency and Duration min 2x/week  1 week       Prognosis Prognosis for Safe Diet Advancement: Guarded Barriers to Reach Goals: Cognitive deficits (lethargy)      Swallow Study   General Date of Onset: 06/19/20 HPI: Gloria Rowland  is a 87 y.o. female, with history of spasmodic torticollis,  nephrolithiasis, hypertension, hyperlipidemia, history of hepatitis, dementia, coronary artery disease, and more presents to the ED with a chief complaint of altered mental status.  Unfortunately patient is nonverbal at this time and any history, report and chart review patient is normally able to hold a clear conversation.  She does have signs of dementia with forgetfulness, but is usually alert and oriented x3.  Today patient was confused, and had decreased urine output and so they called EMS.  During her time in the ED she did have an episode of vomiting.  Patient also had a fever in the ED temperature of 102.5, for which she was given Tylenol.  She was tachycardic up to 118, tachypneic up to 24, had soft pressures as low as 95/51, and a white blood cell count of 19.1.  Her initial lactic acid was 5.8, and improved to 2.3 after 1.750 L of lactated Ringer's.  Patient was initially started on broad-spectrum antibiotics with cefepime, Vanco, Flagyl.  Her UA then came back very consistent with UTI.  Urine culture was ordered.  Blood cultures were also ordered.  Head CT was done that showed no evidence for acute intracranial abnormality.  Chest x-ray was done that showed no active disease. BSE requested. Type of Study: Bedside Swallow Evaluation Previous Swallow Assessment: N/A Diet Prior to this Study: NPO Temperature Spikes Noted: Yes Respiratory Status: Room air History of Recent Intubation: No Behavior/Cognition: Lethargic/Drowsy Oral Cavity Assessment: Within Functional Limits Oral Care Completed by SLP: Yes Oral Cavity - Dentition: Dentures, top;Edentulous Vision: Impaired for self-feeding Self-Feeding Abilities: Total assist Patient Positioning: Upright in bed Baseline Vocal Quality: Not observed Volitional Cough: Cognitively unable to elicit Volitional Swallow: Unable to elicit    Oral/Motor/Sensory Function Overall Oral Motor/Sensory Function:  (no overt asymmetry, Pt unable to participate  in OME)   Ice Chips Ice chips: Impaired Presentation: Spoon Oral Phase Impairments: Reduced labial seal;Reduced lingual movement/coordination Oral Phase Functional Implications: Prolonged oral transit Pharyngeal Phase Impairments: Decreased hyoid-laryngeal movement;Suspected delayed Swallow   Thin Liquid Thin Liquid: Impaired Presentation:  (toothette) Oral Phase Impairments: Reduced lingual movement/coordination Pharyngeal  Phase Impairments: Suspected delayed Swallow;Decreased hyoid-laryngeal movement;Cough - Immediate    Nectar Thick Nectar Thick Liquid: Not tested   Honey Thick Honey Thick Liquid: Not tested   Puree Puree: Not tested   Solid     Solid: Not tested     Thank you,  Havery Moros, CCC-SLP [phone]  Gloria Rowland 06/20/2020,1:53 PM

## 2020-06-20 NOTE — Progress Notes (Signed)
At 10:45 AM per MD request old foley was removed by Student-RN with the assitance of Primary RN. Upon removal the balloon was deflated and the foley tip was intact.  Peri care was then performed by the Student-RN. At 11:00 AM per MD request a new foley was placed by Student-RN and Primary RN. A 14 Fr non-latex foley was placed as seen with urine in the tubing, and was secured by balloon inflation with 10 mL of water.  The foley was pulled back until resistance was met and a securing device was placed on the patient's right thigh securing the foley to the patient.  

## 2020-06-20 NOTE — H&P (Signed)
TRH H&P    Patient Demographics:    Gloria Rowland, is a 87 y.o. female  MRN: [id number]  DOB - 12-01-1932  Admit Date - 06/19/2020  Referring MD/NP/PA:Knapp  Outpatient Primary MD for the patient is Ignatius Specking, MD  Patient coming from: Home  Chief complaint- altered mental status   HPI:    Gloria Rowland  is a 87 y.o. female, with history of spasmodic torticollis, nephrolithiasis, hypertension, hyperlipidemia, history of hepatitis, dementia, coronary artery disease, and more presents to the ED with a chief complaint of altered mental status.  Unfortunately patient is nonverbal at this time and any history, report and chart review patient is normally able to hold a clear conversation.  She does have signs of dementia with forgetfulness, but is usually alert and oriented x3.  Today patient was confused, and had decreased urine output and so they called EMS.  During her time in the ED she did have an episode of vomiting.  Patient also had a fever in the ED temperature of 102.5, for which she was given Tylenol.  She was tachycardic up to 118, tachypneic up to 24, had soft pressures as low as 95/51, and a white blood cell count of 19.1.  Her initial lactic acid was 5.8, and improved to 2.3 after 1.750 L of lactated Ringer's.  Patient was initially started on broad-spectrum antibiotics with cefepime, Vanco, Flagyl.  Her UA then came back very consistent with UTI.  Urine culture was ordered.  Blood cultures were also ordered.  Head CT was done that showed no evidence for acute intracranial abnormality.  Chest x-ray was done that showed no active disease.  Chemistry panel revealed a hyperglycemia at 244, and an AKI.  Baseline creatinine is 0.9 and today was 1.54.  Patient also had a leukocytosis at 19.1.  Covid and flu were negative.  Admission was requested.  Further work-up and treatment of sepsis.  Patient is admitted to  stepdown due to her softer pressures.    Review of systems:    Review of systems could not be obtained secondary to patient's altered mental status    Past History of the following :    Past Medical History:  Diagnosis Date  . Baker's cyst, ruptured   . CAD (coronary artery disease)   . Dementia (HCC)   . History of hepatitis   . Hyperlipidemia   . Hypertension   . Lumbar degenerative disc disease   . Nephrolithiasis   . Spasmodic torticollis       Past Surgical History:  Procedure Laterality Date  . ABDOMINAL HYSTERECTOMY    . CARPAL TUNNEL RELEASE    . CHOLECYSTECTOMY    . CORONARY ARTERY BYPASS GRAFT  1998   LIMA to LAD-Dx  . ESOPHAGOGASTRODUODENOSCOPY N/A 01/15/2017   Procedure: ESOPHAGOGASTRODUODENOSCOPY (EGD);  Surgeon: Malissa Hippo, MD;  Location: [facility];  Service: Endoscopy;  Laterality: N/A;  1:00  . KIDNEY STONE SURGERY    . LEFT HEART CATHETERIZATION WITH CORONARY/GRAFT ANGIOGRAM N/A 05/26/2011   Procedure: LEFT HEART CATHETERIZATION  WITH Isabel Caprice;  Surgeon: Othella Boyer, MD;  Location: [facility] CATH LAB;  Service: Cardiovascular;  Laterality: N/A;  . LUMBAR LAMINECTOMY        Social History:      Social History   Tobacco Use  . Smoking status: Never Smoker  . Smokeless tobacco: Never Used  Substance Use Topics  . Alcohol use: No       Family History :     Family History  Problem Relation Age of Onset  . Diabetes Sister   . Diabetes Sister    Family history could not be reviewed secondary to patient's altered mental status   Home Medications:   Prior to Admission medications   Medication Sig Start Date End Date Taking? Authorizing Provider  acetaminophen (TYLENOL) 650 MG CR tablet Take 650 mg by mouth every 6 (six) hours as needed for pain.    [provider]  amLODipine (NORVASC) 10 MG tablet Take 1 tablet (10 mg total) by mouth daily. 05/17/20 08/15/20  Darlin Drop, DO  aspirin EC 81 MG tablet Take 81  mg by mouth daily.    [provider]  citalopram (CELEXA) 40 MG tablet Take 40 mg by mouth daily. 02/26/20   [provider]  clopidogrel (PLAVIX) 75 MG tablet Take 1 tablet (75 mg total) by mouth daily. 05/17/20 05/12/21  Darlin Drop, DO  Dextromethorphan-guaiFENesin 5-100 MG/5ML LIQD Give 10 ml as needed for cough every 4 hours prn    [provider]  gemfibrozil (LOPID) 600 MG tablet Take 300 mg by mouth 2 (two) times daily before a meal. 02/27/20   [provider]  isosorbide mononitrate (IMDUR) 30 MG 24 hr tablet Take 1 tablet (30 mg total) by mouth daily. 05/17/20 08/15/20  Darlin Drop, DO  lisinopril (ZESTRIL) 20 MG tablet Take 1 tablet (20 mg total) by mouth daily. 05/17/20 08/15/20  Darlin Drop, DO  memantine (NAMENDA) 10 MG tablet Take 10 mg by mouth 2 (two) times daily.    [provider]  nitroGLYCERIN (NITROSTAT) 0.4 MG SL tablet Place 1 tablet (0.4 mg total) under the tongue every 5 (five) minutes x 3 doses as needed for up to 10 days for chest pain. 05/16/20 05/26/20  Darlin Drop, DO  ondansetron (ZOFRAN ODT) 4 MG disintegrating tablet Take 1 tablet (4 mg total) by mouth every 8 (eight) hours as needed for nausea or vomiting. 04/12/18   Tat, Onalee Hua, MD  pantoprazole (PROTONIX) 40 MG tablet Take 1 tablet (40 mg total) by mouth daily. 04/12/18   Catarina Hartshorn, MD  polyethylene glycol powder (GLYCOLAX/MIRALAX) powder Take 17 g by mouth daily. For constipation. 12/04/16   [provider]  rosuvastatin (CRESTOR) 20 MG tablet Take 1 tablet (20 mg total) by mouth daily. 05/17/20 08/15/20  Darlin Drop, DO  simethicone (MYLICON) 80 MG chewable tablet Chew 1 tablet (80 mg total) by mouth 4 (four) times daily as needed for flatulence. 05/16/20   Darlin Drop, DO  Vitamin D, Ergocalciferol, (DRISDOL) 50000 units CAPS capsule Take 50,000 Units by mouth every 7 (seven) days.    [provider]     Allergies:     Allergies  Allergen  Reactions  . Aspirin Nausea And Vomiting    Upset stomach  . Codeine Nausea And Vomiting    Upset stomach     Physical Exam:   Vitals  Blood pressure (!) 103/58, pulse 68, temperature (!) 102.5 F (39.2 C), temperature  source Rectal, resp. rate (!) 24, weight 53 kg, SpO2 94 %.  1.  General: Patient lying supine in bed head of bed elevated, mouth open, sleeping upon my entry into the room  2. Psychiatric: Could not be evaluated as patient is nonverbal at this time  3. Neurologic: Patient is not able to participate in neuro exam  4. HEENMT:  Head is atraumatic, normocephalic, pupils are reactive to light, neck is supple, trachea is midline  5. Respiratory :  Clear to auscultation bilaterally without wheezing, rhonchi, rales, no clubbing, no cyanosis  6. Cardiovascular : Heart rate is normal, rhythm is regular, no murmurs rubs or gallops, no peripheral edema  7. Gastrointestinal:  Abdomen mildly tender in the suprapubic region, otherwise nontender, no palpable masses, nondistended, bowel sounds active  8. Skin:  Skin is warm dry and intact without acute lesion on limited exam  9.Musculoskeletal:  No acute deformity, no peripheral edema, no calf tenderness    Data Review:    CBC Recent Labs  Lab 06/19/20 2050  WBC 19.1*  HGB 10.6*  HCT 33.0*  PLT 262  MCV 95.9  MCH 30.8  MCHC 32.1  RDW 13.1  LYMPHSABS 2.3  MONOABS 0.3  EOSABS 0.0  BASOSABS 0.0   ------------------------------------------------------------------------------------------------------------------  Results for orders placed or performed during the hospital encounter of 06/19/20 (from the past 48 hour(s))  Urinalysis, Routine w reflex microscopic Urine, Catheterized     Status: Abnormal   Collection Time: 06/19/20  8:40 PM  Result Value Ref Range   Color, Urine YELLOW YELLOW   APPearance CLOUDY (A) CLEAR   Specific Gravity, Urine 1.019 1.005 - 1.030   pH 5.0 5.0 - 8.0   Glucose, UA  NEGATIVE NEGATIVE mg/dL   Hgb urine dipstick LARGE (A) NEGATIVE   Bilirubin Urine NEGATIVE NEGATIVE   Ketones, ur NEGATIVE NEGATIVE mg/dL   Protein, ur 161100 (A) NEGATIVE mg/dL   Nitrite NEGATIVE NEGATIVE   Leukocytes,Ua LARGE (A) NEGATIVE   RBC / HPF >50 (H) 0 - 5 RBC/hpf   WBC, UA >50 (H) 0 - 5 WBC/hpf   Bacteria, UA FEW (A) NONE SEEN   Squamous Epithelial / LPF 6-10 0 - 5   Mucus PRESENT    Hyaline Casts, UA PRESENT     Comment: Performed at [facility], [address] [place], [place] [address]  Comprehensive metabolic panel     Status: Abnormal   Collection Time: 06/19/20  8:50 PM  Result Value Ref Range   Sodium 135 135 - 145 mmol/L   Potassium 4.2 3.5 - 5.1 mmol/L   Chloride 101 98 - 111 mmol/L   CO2 17 (L) 22 - 32 mmol/L   Glucose, Bld 244 (H) 70 - 99 mg/dL    Comment: Glucose reference range applies only to samples taken after fasting for at least 8 hours.   BUN 32 (H) 8 - 23 mg/dL   Creatinine, Ser 5.401.54 (H) 0.44 - 1.00 mg/dL   Calcium 8.9 8.9 - 98.110.3 mg/dL   Total Protein 7.6 6.5 - 8.1 g/dL   Albumin 4.1 3.5 - 5.0 g/dL   AST 24 15 - 41 U/L   ALT 13 0 - 44 U/L   Alkaline Phosphatase 68 38 - 126 U/L   Total Bilirubin 0.9 0.3 - 1.2 mg/dL   GFR, Estimated 33 (L) >60 mL/min    Comment: (NOTE) Calculated using the CKD-EPI Creatinine Equation (2021)    Anion gap 17 (H) 5 - 15  Comment: Performed at [facility], [address] [place], [place] [address]  Lactic acid, plasma     Status: Abnormal   Collection Time: 06/19/20  8:50 PM  Result Value Ref Range   Lactic Acid, Venous 5.8 (HH) 0.5 - 1.9 mmol/L    Comment: CRITICAL RESULT CALLED TO, READ BACK BY AND VERIFIED WITH: BELTON,C ON 06/19/20 AT 2145 BY LOY,C Performed at [facility], [address] [place], [place] [address]   CBC with Differential     Status: Abnormal   Collection Time: 06/19/20  8:50 PM  Result Value Ref Range   WBC 19.1 (H) 4.0 - 10.5 K/uL   RBC 3.44 (L) 3.87 - 5.11 MIL/uL   Hemoglobin 10.6  (L) 12.0 - 15.0 g/dL   HCT 09.8 (L) 11.9 - 14.7 %   MCV 95.9 80.0 - 100.0 fL   MCH 30.8 26.0 - 34.0 pg   MCHC 32.1 30.0 - 36.0 g/dL   RDW 82.9 56.2 - 13.0 %   Platelets 262 150 - 400 K/uL   nRBC 0.0 0.0 - 0.2 %   Neutrophils Relative % 86 %   Neutro Abs 16.4 (H) 1.7 - 7.7 K/uL   Lymphocytes Relative 12 %   Lymphs Abs 2.3 0.7 - 4.0 K/uL   Monocytes Relative 1 %   Monocytes Absolute 0.3 0.1 - 1.0 K/uL   Eosinophils Relative 0 %   Eosinophils Absolute 0.0 0.0 - 0.5 K/uL   Basophils Relative 0 %   Basophils Absolute 0.0 0.0 - 0.1 K/uL   Immature Granulocytes 1 %   Abs Immature Granulocytes 0.10 (H) 0.00 - 0.07 K/uL    Comment: Performed at [facility], [address], [place], [place] [address]  Protime-INR     Status: None   Collection Time: 06/19/20  8:50 PM  Result Value Ref Range   Prothrombin Time 14.6 11.4 - 15.2 seconds   INR 1.2 0.8 - 1.2    Comment: (NOTE) INR goal varies based on device and disease states. Performed at [facility], [address] [place], [place] [address]   APTT     Status: None   Collection Time: 06/19/20  8:50 PM  Result Value Ref Range   aPTT 24 24 - 36 seconds    Comment: Performed at [facility], [address] [place], [place] [address]  Resp Panel by RT-PCR (Flu A&B, Covid) Urine, Catheterized     Status: None   Collection Time: 06/19/20  8:55 PM   Specimen: Urine, Catheterized; Nasopharyngeal(NP) swabs in vial transport medium  Result Value Ref Range   SARS Coronavirus 2 by RT PCR NEGATIVE NEGATIVE    Comment: (NOTE) SARS-CoV-2 target nucleic acids are NOT DETECTED.  The SARS-CoV-2 RNA is generally detectable in upper respiratory specimens during the acute phase of infection. The lowest concentration of SARS-CoV-2 viral copies this assay can detect is 138 copies/mL. A negative result does not preclude SARS-Cov-2 infection and should not be used as the sole basis for treatment or other patient management decisions. A negative result may  occur with  improper specimen collection/handling, submission of specimen other than nasopharyngeal swab, presence of viral mutation(s) within the areas targeted by this assay, and inadequate number of viral copies(<138 copies/mL). A negative result must be combined with clinical observations, patient history, and epidemiological information. The expected result is Negative.  Fact Sheet for Patients:  BloggerCourse.com  Fact Sheet for Healthcare Providers:  SeriousBroker.it  This test is no t yet approved or cleared  by the [place] and  has been authorized for detection and/or diagnosis of SARS-CoV-2 by FDA under an Emergency Use Authorization (EUA). This EUA will remain  in effect (meaning this test can be used) for the duration of the COVID-19 declaration under Section 564(b)(1) of the Act, 21 U.S.C.section 360bbb-3(b)(1), unless the authorization is terminated  or revoked sooner.       Influenza A by PCR NEGATIVE NEGATIVE   Influenza B by PCR NEGATIVE NEGATIVE    Comment: (NOTE) The Xpert Xpress SARS-CoV-2/FLU/RSV plus assay is intended as an aid in the diagnosis of influenza from Nasopharyngeal swab specimens and should not be used as a sole basis for treatment. Nasal washings and aspirates are unacceptable for Xpert Xpress SARS-CoV-2/FLU/RSV testing.  Fact Sheet for Patients: BloggerCourse.com  Fact Sheet for Healthcare Providers: SeriousBroker.it  This test is not yet approved or cleared by the [place] FDA and has been authorized for detection and/or diagnosis of SARS-CoV-2 by FDA under an Emergency Use Authorization (EUA). This EUA will remain in effect (meaning this test can be used) for the duration of the COVID-19 declaration under Section 564(b)(1) of the Act, 21 U.S.C. section 360bbb-3(b)(1), unless the authorization is terminated  or revoked.  Performed at [facility], [address], [place], [place] [address]   Lactic acid, plasma     Status: Abnormal   Collection Time: 06/19/20  9:36 PM  Result Value Ref Range   Lactic Acid, Venous 2.3 (HH) 0.5 - 1.9 mmol/L    Comment: CRITICAL VALUE NOTED.  VALUE IS CONSISTENT WITH PREVIOUSLY REPORTED AND CALLED VALUE. Performed at [facility], [address] [place], [place] [address]   Culture, blood (Routine x 2)     Status: None (Preliminary result)   Collection Time: 06/19/20  9:36 PM   Specimen: Site Not Specified; Blood  Result Value Ref Range   Specimen Description SITE NOT SPECIFIED    Special Requests      BOTTLES DRAWN AEROBIC AND ANAEROBIC Blood Culture adequate volume Performed at [facility], [address], [place], [place] [address]    Culture PENDING    Report Status PENDING     Chemistries  Recent Labs  Lab 06/19/20 2050  NA 135  K 4.2  CL 101  CO2 17*  GLUCOSE 244*  BUN 32*  CREATININE 1.54*  CALCIUM 8.9  AST 24  ALT 13  ALKPHOS 68  BILITOT 0.9   ------------------------------------------------------------------------------------------------------------------  ------------------------------------------------------------------------------------------------------------------ GFR: Estimated Creatinine Clearance: 21.9 mL/min (A) (by C-G formula based on SCr of 1.54 mg/dL (H)). Liver Function Tests: Recent Labs  Lab 06/19/20 2050  AST 24  ALT 13  ALKPHOS 68  BILITOT 0.9  PROT 7.6  ALBUMIN 4.1   No results for input(s): LIPASE, AMYLASE in the last 168 hours. No results for input(s): AMMONIA in the last 168 hours. Coagulation Profile: Recent Labs  Lab 06/19/20 2050  INR 1.2   Cardiac Enzymes: No results for input(s): CKTOTAL, CKMB, CKMBINDEX, TROPONINI in the last 168 hours. BNP (last 3 results) No results for input(s): PROBNP in the last 8760 hours. HbA1C: No results for input(s): HGBA1C in the last 72 hours. CBG: No  results for input(s): GLUCAP in the last 168 hours. Lipid Profile: No results for input(s): CHOL, HDL, LDLCALC, TRIG, CHOLHDL, LDLDIRECT in the last 72 hours. Thyroid Function Tests: No results for input(s): TSH, T4TOTAL, FREET4, T3FREE, THYROIDAB in the last 72 hours. Anemia Panel: No results for input(s): VITAMINB12, FOLATE, FERRITIN, TIBC, IRON, RETICCTPCT  in the last 72 hours.  --------------------------------------------------------------------------------------------------------------- Urine analysis:    Component Value Date/Time   COLORURINE YELLOW 06/19/2020 2040   APPEARANCEUR CLOUDY (A) 06/19/2020 2040   LABSPEC 1.019 06/19/2020 2040   PHURINE 5.0 06/19/2020 2040   GLUCOSEU NEGATIVE 06/19/2020 2040   HGBUR LARGE (A) 06/19/2020 2040   BILIRUBINUR NEGATIVE 06/19/2020 2040   KETONESUR NEGATIVE 06/19/2020 2040   PROTEINUR 100 (A) 06/19/2020 2040   NITRITE NEGATIVE 06/19/2020 2040   LEUKOCYTESUR LARGE (A) 06/19/2020 2040      Imaging Results:    CT Head Wo Contrast  Result Date: 06/19/2020 CLINICAL DATA:  Mental status change fever EXAM: CT HEAD WITHOUT CONTRAST TECHNIQUE: Contiguous axial images were obtained from the base of the skull through the vertex without intravenous contrast. COMPARISON:  CT brain 05/01/2017 FINDINGS: Brain: No acute territorial infarction, hemorrhage or intracranial mass. Atrophy and mild chronic small vessel ischemic changes of the white matter. Stable ventricle size. Vascular: No hyperdense vessels.  Carotid vascular calcification. Skull: Normal. Negative for fracture or focal lesion. Sinuses/Orbits: No acute finding. Other: None IMPRESSION: 1. No CT evidence for acute intracranial abnormality. 2. Atrophy and mild chronic small vessel ischemic changes of the white matter. Electronically Signed   By: Jasmine Pang M.D.   On: 06/19/2020 22:03   DG Chest Port 1 View  Result Date: 06/19/2020 CLINICAL DATA:  Fever EXAM: PORTABLE CHEST 1 VIEW COMPARISON:   05/12/2020 FINDINGS: Post sternotomy changes. No focal opacity or pleural effusion. Aortic atherosclerosis. Normal cardiac size. Scoliosis of the spine. IMPRESSION: No active disease. Electronically Signed   By: Jasmine Pang M.D.   On: 06/19/2020 21:11    My personal review of EKG: Rhythm atrial rhythm, Rate 81/min, QTc 533   Assessment & Plan:    Active Problems:   Sepsis (HCC)   Lactic acidosis   Acute lower UTI   AKI (acute kidney injury) (HCC)   Acute metabolic encephalopathy   1. Sepsis secondary to UTI 1. Temp 102.5 White blood cell count 19, heart rate 118 respiratory rate 24, lactic acid 5.8, AKI 2. Blood cultures, urine cultures pending 3. Flu and Covid negative 4. Chest x-ray without acute changes 5. UA indicative of UTI 6. Patient started on vancomycin, cefepime, Flagyl -de-escalate to cefepime 7. Follow-up urine culture and change antibiotics as indicated 8. Tylenol for fever 9. Patient was given fluid bolus in the ED, continue IV fluids 10. Monitor on stepdown 2. UTI 1. See plan above 3. Acute metabolic encephalopathy 1. Secondary to sepsis 2. CT without acute changes 4. Lactic acidosis 1. Secondary to sepsis 2. Downtrending already from 5.8-2.3 3. Continue IV fluids 4. Continue to trend 5. AKI 1. Baseline creatinine 0.9 2. Today creatinine 1.54 3. Hold ACE 4. Avoid other nephrotoxic agents when possible 5. Continue to monitor 6. Prolonged QT 1. QT 533 2. Continue to monitor and replace electrolytes 3. Continue to monitor on telemetry 4. Avoid QT prolonging agents when possible   DVT Prophylaxis-Heparin- SCDs   AM Labs Ordered, also please review Full Orders  Family Communication: No family at bedside Code Status: Full  admission status: Inpatient :The appropriate admission status for this patient is INPATIENT. Inpatient status is judged to be reasonable and necessary in order to provide the required intensity of service to ensure the patient's  safety. The patient's presenting symptoms, physical exam findings, and initial radiographic and laboratory data in the context of their chronic comorbidities is felt to place them at high risk for further clinical  deterioration. Furthermore, it is not anticipated that the patient will be medically stable for discharge from the hospital within 2 midnights of admission. The following factors support the admission status of inpatient.     The patient's presenting symptoms include altered mental status The worrisome physical exam findings include fever, tachycardia, tachypnea, acute metabolic encephalopathy The initial radiographic and laboratory data are worrisome because of UA indicative of UTI, lactic acidosis, leukocytosis The chronic co-morbidities include torticollis, hypertension, hyperlipidemia, coronary artery disease.       * I certify that at the point of admission it is my clinical judgment that the patient will require inpatient hospital care spanning beyond 2 midnights from the point of admission due to high intensity of service, high risk for further deterioration and high frequency of surveillance required.*  Time spent in minutes : Full  Jahmere Bramel B Zierle-Ghosh DO

## 2020-06-20 NOTE — Plan of Care (Signed)
Palliative: Thank you for this consult. Unfortunately there will be a delay in a Palliative Provider seeing this patient. Palliative Medicine will return to service on 06/25/2020 and will see patient at that time.  Palliative Medicine Please call Palliative Medicine team phone with any questions [phone]. For individual providers please see AMION. No charge

## 2020-06-20 NOTE — Progress Notes (Signed)
PHARMACY - PHYSICIAN COMMUNICATION CRITICAL VALUE ALERT - BLOOD CULTURE IDENTIFICATION (BCID)  Gloria Rowland is an 87 y.o. female who presented to [facility] on 06/19/2020 with a chief complaint of sepsis  Assessment:  69 YOF admitted with sepsis, positive BCID but only 1 out of 4 bottles strep species no resistance detected (include suspected source if known)  Name of physician (or Provider) Contacted: Dr Laural Benes   Current antibiotics: cefepime  Changes to prescribed antibiotics recommended:  Response not received from provider;  current antibiotics are likely to cover the isolated organism.  Consider de-escalation soon.  No results found for this or any previous visit.  Gerre Pebbles Nelida Mandarino 06/20/2020  9:55 AM

## 2020-06-21 LAB — COMPREHENSIVE METABOLIC PANEL
ALT: 43 U/L (ref 0–44)
AST: 69 U/L — ABNORMAL HIGH (ref 15–41)
Albumin: 2.7 g/dL — ABNORMAL LOW (ref 3.5–5.0)
Alkaline Phosphatase: 57 U/L (ref 38–126)
Anion gap: 8 (ref 5–15)
BUN: 27 mg/dL — ABNORMAL HIGH (ref 8–23)
CO2: 20 mmol/L — ABNORMAL LOW (ref 22–32)
Calcium: 8.1 mg/dL — ABNORMAL LOW (ref 8.9–10.3)
Chloride: 113 mmol/L — ABNORMAL HIGH (ref 98–111)
Creatinine, Ser: 0.99 mg/dL (ref 0.44–1.00)
GFR, Estimated: 56 mL/min — ABNORMAL LOW (ref >60)
Glucose, Bld: 123 mg/dL — ABNORMAL HIGH (ref 70–99)
Potassium: 3.4 mmol/L — ABNORMAL LOW (ref 3.5–5.1)
Sodium: 141 mmol/L (ref 135–145)
Total Bilirubin: 0.6 mg/dL (ref 0.3–1.2)
Total Protein: 5.8 g/dL — ABNORMAL LOW (ref 6.5–8.1)

## 2020-06-21 LAB — CBC WITH DIFFERENTIAL/PLATELET
Abs Immature Granulocytes: 0.04 10*3/uL (ref 0.00–0.07)
Basophils Absolute: 0 10*3/uL (ref 0.0–0.1)
Basophils Relative: 0 %
Eosinophils Absolute: 0 10*3/uL (ref 0.0–0.5)
Eosinophils Relative: 0 %
HCT: 29.6 % — ABNORMAL LOW (ref 36.0–46.0)
Hemoglobin: 9.5 g/dL — ABNORMAL LOW (ref 12.0–15.0)
Immature Granulocytes: 0 %
Lymphocytes Relative: 7 %
Lymphs Abs: 0.7 10*3/uL (ref 0.7–4.0)
MCH: 31.3 pg (ref 26.0–34.0)
MCHC: 32.1 g/dL (ref 30.0–36.0)
MCV: 97.4 fL (ref 80.0–100.0)
Monocytes Absolute: 0.6 10*3/uL (ref 0.1–1.0)
Monocytes Relative: 7 %
Neutro Abs: 8 10*3/uL — ABNORMAL HIGH (ref 1.7–7.7)
Neutrophils Relative %: 86 %
Platelets: 141 10*3/uL — ABNORMAL LOW (ref 150–400)
RBC: 3.04 MIL/uL — ABNORMAL LOW (ref 3.87–5.11)
RDW: 13.4 % (ref 11.5–15.5)
WBC: 9.3 10*3/uL (ref 4.0–10.5)
nRBC: 0 % (ref 0.0–0.2)

## 2020-06-21 LAB — URINE CULTURE: Culture: 70000 — AB

## 2020-06-21 LAB — MAGNESIUM: Magnesium: 1.9 mg/dL (ref 1.7–2.4)

## 2020-06-21 MED ORDER — SODIUM CHLORIDE 0.9 % IV SOLN
2.0000 g | INTRAVENOUS | Status: DC
  Administered 2020-06-21 – 2020-06-23 (×3): 2 g via INTRAVENOUS
  Filled 2020-06-21 (×3): qty 20

## 2020-06-21 MED ORDER — HYDRALAZINE HCL 20 MG/ML IJ SOLN: 10.0000 mg | INTRAMUSCULAR | Status: DC | PRN

## 2020-06-21 NOTE — Progress Notes (Signed)
Report called to angel, rn on medsurg 300

## 2020-06-21 NOTE — Progress Notes (Signed)
  Speech Language Pathology Treatment: Dysphagia  Patient Details Name: ANVITHA HUTMACHER MRN: [id number] DOB: August 28, 1932 Today's Date: 06/21/2020 Time: 6160-7371 SLP Time Calculation (min) (ACUTE ONLY): 27.38 min  Assessment / Plan / Recommendation Clinical Impression  Ongoing diagnostic dysphagia therapy provided today; Pt with markedly improved alertness. Pt sitting with eyes open verbally responding appropriately to questions. SLP provided oral care and donned U & L dentures. Pt then consumed regular textures, thin liquids and puree textures without overt s/sx of aspiration. Recommend initiate D3/mech soft diet with thin liquids. Recommend meds to be administered whole with liquids. Pt continues to be weak and require some hand over hand to assist/facilitate self-feeding. Pt will benefit from full supervision and support during meals at this time. ST follow up X1 to ensure diet tolerance. Thank you,    HPI HPI: Marsi Turvey  is a 87 y.o. female, with history of spasmodic torticollis, nephrolithiasis, hypertension, hyperlipidemia, history of hepatitis, dementia, coronary artery disease, and more presents to the ED with a chief complaint of altered mental status.  Unfortunately patient is nonverbal at this time and any history, report and chart review patient is normally able to hold a clear conversation.  She does have signs of dementia with forgetfulness, but is usually alert and oriented x3.  Today patient was confused, and had decreased urine output and so they called EMS.  During her time in the ED she did have an episode of vomiting.  Patient also had a fever in the ED temperature of 102.5, for which she was given Tylenol.  She was tachycardic up to 118, tachypneic up to 24, had soft pressures as low as 95/51, and a white blood cell count of 19.1.  Her initial lactic acid was 5.8, and improved to 2.3 after 1.750 L of lactated Ringer's.  Patient was initially started on broad-spectrum antibiotics  with cefepime, Vanco, Flagyl.  Her UA then came back very consistent with UTI.  Urine culture was ordered.  Blood cultures were also ordered.  Head CT was done that showed no evidence for acute intracranial abnormality.  Chest x-ray was done that showed no active disease. BSE requested.      SLP Plan  Continue with current plan of care       Recommendations  Diet recommendations: Dysphagia 3 (mechanical soft);Thin liquid Liquids provided via: Cup;Straw Medication Administration: Whole meds with liquid Supervision: Patient able to self feed;Full supervision/cueing for compensatory strategies Compensations: Minimize environmental distractions;Slow rate;Small sips/bites Postural Changes and/or Swallow Maneuvers: Seated upright 90 degrees                Follow up Recommendations: 24 hour supervision/assistance SLP Visit Diagnosis: Dysphagia, unspecified (R13.10) Plan: Continue with current plan of care       Jadda Hunsucker H. Romie Levee, CCC-SLP Speech Language Pathologist    Georgetta Haber 06/21/2020, 1:24 PM

## 2020-06-21 NOTE — TOC Initial Note (Signed)
Transition of Care [facility]) - Initial/Assessment Note    Patient Details  Name: TEQUISHA MAAHS MRN: [id number] Date of Birth: 23-Sep-1932  [facility]) CM/SW Contact:    Elliot Gault, LCSW Phone Number: 06/21/2020, 3:11 PM  Clinical Narrative:                  Pt admitted from home. Medical workup is in progress. Spoke with pt's daughter today to assess for dc planning needs. Pt has been staying with her daughter and they have had [facility] PT active. Pt uses a walker for ambulation. Pt's daughter states that if pt is recommended for SNF rehab, they would like [facility]. Pt is vaccinated and boosted per daughter. If pt is recommended for [facility], daughter states that pt will return to daughter's home with continued HH from [facility].  Assigned TOC to follow.  Expected Discharge Plan: Home w Home Health Services Barriers to Discharge: Continued Medical Work up   Patient Goals and CMS Choice Patient states their goals for this hospitalization and ongoing recovery are:: get better CMS Medicare.gov Compare Post Acute Care list provided to:: Patient Represenative (must comment) Choice offered to / list presented to : Adult Children  Expected Discharge Plan and Services Expected Discharge Plan: Home w Home Health Services In-house Referral: Clinical Social Work   Post Acute Care Choice: Resumption of Svcs/PTA Provider Living arrangements for the past 2 months: Single Family Home                                      Prior Living Arrangements/Services Living arrangements for the past 2 months: Single Family Home Lives with:: Adult Children Patient language and need for interpreter reviewed:: Yes Do you feel safe going back to the place where you live?: Yes      Need for Family Participation in Patient Care: Yes (Comment) Care giver support system in place?: Yes (comment) Current home services: Home PT Criminal Activity/Legal Involvement Pertinent to Current  Situation/Hospitalization: No - Comment as needed  Activities of Daily Living   ADL Screening (condition at time of admission) Patient's cognitive ability adequate to safely complete daily activities?: No Does the patient have difficulty concentrating, remembering, or making decisions?: Yes Patient able to express need for assistance with ADLs?: No Does the patient have difficulty dressing or bathing?: Yes Independently performs ADLs?: No Communication: Needs assistance Is this a change from baseline?: Pre-admission baseline Dressing (OT): Needs assistance,Dependent Is this a change from baseline?: Pre-admission baseline Grooming: Needs assistance Is this a change from baseline?: Pre-admission baseline Feeding: Needs assistance Bathing: Needs assistance Is this a change from baseline?: Pre-admission baseline Toileting: Needs assistance Is this a change from baseline?: Pre-admission baseline In/Out Bed: Needs assistance Is this a change from baseline?: Pre-admission baseline Walks in Home: Needs assistance Is this a change from baseline?: Pre-admission baseline Does the patient have difficulty walking or climbing stairs?: Yes Weakness of Legs: Both Weakness of Arms/Hands: Both  Permission Sought/Granted                  Emotional Assessment       Orientation: : Oriented to Self Alcohol / Substance Use: Not Applicable Psych Involvement: No (comment)  Admission diagnosis:  Confusion [R41.0] Sepsis (HCC) [A41.9] Sepsis, due to unspecified organism, unspecified whether acute organ dysfunction present [facility]) [A41.9] Patient Active Problem List   Diagnosis Date Noted  . Sepsis (HCC)  06/20/2020  . Lactic acidosis 06/20/2020  . Acute lower UTI 06/20/2020  . AKI (acute kidney injury) (HCC) 06/20/2020  . Acute metabolic encephalopathy 06/20/2020  . Palliative care by specialist   . NSTEMI (non-ST elevated myocardial infarction) (HCC) 05/13/2020  . Hypokalemia 05/13/2020   . Chronic kidney disease, stage 3a (HCC) 05/13/2020  . Hyperglycemia 05/13/2020  . Severe protein-calorie malnutrition (HCC) 05/13/2020  . Normocytic anemia 05/13/2020  . Intractable nausea and vomiting 04/10/2018  . Hydronephrosis of left kidney 04/10/2018  . Dementia without behavioral disturbance (HCC) 04/10/2018  . Pain of upper abdomen   . Abdominal pain, epigastric 12/24/2016  . Hiatal hernia 05/27/2011  . CAD (coronary artery disease)   . S/P CABG (coronary artery bypass graft)   . Hyperlipidemia   . Hypertension   . Nephrolithiasis   . Lumbar degenerative disc disease   . Dementia (HCC)    PCP:  Ignatius Specking, MD Pharmacy:   [facility] [address] [place] - [address] [place] [address] Phone: [phone] Fax: [phone]  [facility] SERV[facility]) [facility] - [place], [place] - [address] [place] [address] Phone: [phone] Fax: [phone]     Social Determinants of Health (SDOH) Interventions    Readmission Risk Interventions Readmission Risk Prevention Plan 06/21/2020 06/21/2020  Transportation Screening - Complete  HRI or Home Care Consult - Complete  Social Work Consult for Recovery Care Planning/Counseling - Complete  Palliative Care Screening Complete Not Applicable  Medication Review [employer]) - Complete  Some recent data might be hidden

## 2020-06-21 NOTE — Progress Notes (Signed)
PROGRESS NOTE   Gloria Rowland  PYK:[id number] DOB: 1932/05/15 DOA: 06/19/2020 PCP: Ignatius Specking, MD   No chief complaint on file.  Level of care: Telemetry  Brief Admission History:  87 y.o. female, with history of spasmodic torticollis, nephrolithiasis, hypertension, hyperlipidemia, history of hepatitis, dementia, coronary artery disease, and more presents to the ED with a chief complaint of altered mental status.  Unfortunately patient is nonverbal at this time and any history, report and chart review patient is normally able to hold a clear conversation.  She does have signs of dementia with forgetfulness, but is usually alert and oriented x3.  Today patient was confused, and had decreased urine output and so they called EMS.  During her time in the ED she did have an episode of vomiting.  Patient also had a fever in the ED temperature of 102.5, for which she was given Tylenol.  She was tachycardic up to 118, tachypneic up to 24, had soft pressures as low as 95/51, and a white blood cell count of 19.1.  Her initial lactic acid was 5.8, and improved to 2.3 after 1.750 L of lactated Ringer's.  Patient was initially started on broad-spectrum antibiotics with cefepime, Vanco, Flagyl.  Her UA then came back very consistent with UTI.  Urine culture was ordered.  Blood cultures were also ordered.  Head CT was done that showed no evidence for acute intracranial abnormality.  Chest x-ray was done that showed no active disease.  Chemistry panel revealed a hyperglycemia at 244, and an AKI.  Baseline creatinine is 0.9 and today was 1.54.  Patient also had a leukocytosis at 19.1.  Covid and flu were negative.  Admission was requested.  Further work-up and treatment of sepsis.  Patient is admitted to stepdown due to her softer pressures.  Assessment & Plan:   Active Problems:   Palliative care by specialist   Sepsis (HCC)   Lactic acidosis   Acute lower UTI   AKI (acute kidney injury) (HCC)   Acute  metabolic encephalopathy  1. Group B Strep Sepsis - sepsis physiology is improving with therapy.   2. GBS UTI - Afebrile, antibiotics de-escalated to ceftriaxone 2gm IV every 24 hours.  Continue supportive care.  3. Acute metabolic encephalopathy - improving with supportive therapies.  4. Lactic acidosis - resolved after IV fluids.  5. AKI - renal function is improving.  6. Prolonged QTc - stable.  7. DNR present on admission - daughter confirmed.   DVT prophylaxis:  SCDs Code Status:  Family Communication:  Disposition: TBD Status is: Inpatient  Remains inpatient appropriate because:IV treatments appropriate due to intensity of illness or inability to take PO and Inpatient level of care appropriate due to severity of illness   Dispo: The patient is from: Home              Anticipated d/c is to: Home              Patient currently is not medically stable to d/c.   Difficult to place patient No   Consultants:   Palliative care  Procedures:     Antimicrobials:  Cefepime 3/30>> Ceftriaxone 4/1>>  Subjective: Pt without specific complaints.   Objective: Vitals:   06/21/20 1100 06/21/20 1152 06/21/20 1200 06/21/20 1300  BP: (!) 160/53  (!) 165/77 (!) 165/63  Pulse: (!) 51  61 (!) 59  Resp: 18  (!) 21 17  Temp:  (!) 97.4 F (36.3 C) (!) 97.4 F (36.3 C)  TempSrc:  Oral Oral   SpO2: 98%  97% 97%  Weight:      Height:        Intake/Output Summary (Last 24 hours) at 06/21/2020 1424 Last data filed at 06/21/2020 0913 Gross per 24 hour  Intake 975.26 ml  Output 995 ml  Net -19.74 ml   Filed Weights   06/19/20 2014 06/20/20 0441 06/21/20 0500  Weight: 53 kg 53.4 kg 53.4 kg    Examination:  General exam: frail, elderly female, appears chronically ill, Appears calm and comfortable, somnolent but arousable.  Respiratory system: Clear to auscultation. Respiratory effort normal. Cardiovascular system: normal S1 & S2 heard. No JVD, murmurs, rubs, gallops or clicks. No  pedal edema. Gastrointestinal system: Abdomen is nondistended, soft and nontender. No organomegaly or masses felt. Normal bowel sounds heard. Central nervous system: Alert and oriented. No focal neurological deficits. Extremities: Symmetric 5 x 5 power. Skin: No rashes, lesions or ulcers Psychiatry: Judgement and insight delayed. Mood & affect appropriate.   Data Reviewed: I have personally reviewed following labs and imaging studies  CBC: Recent Labs  Lab 06/19/20 2050 06/20/20 0604 06/21/20 0348  WBC 19.1* 16.2* 9.3  NEUTROABS 16.4* 14.7* 8.0*  HGB 10.6* 9.5* 9.5*  HCT 33.0* 29.9* 29.6*  MCV 95.9 97.4 97.4  PLT 262 160 141*    Basic Metabolic Panel: Recent Labs  Lab 06/19/20 2050 06/20/20 0604 06/21/20 0348  NA 135 138 141  K 4.2 3.6 3.4*  CL 101 107 113*  CO2 17* 21* 20*  GLUCOSE 244* 165* 123*  BUN 32* 27* 27*  CREATININE 1.54* 1.29* 0.99  CALCIUM 8.9 8.2* 8.1*  MG  --  1.8 1.9    GFR: Estimated Creatinine Clearance: 34.4 mL/min (by C-G formula based on SCr of 0.99 mg/dL).  Liver Function Tests: Recent Labs  Lab 06/19/20 2050 06/20/20 0604 06/21/20 0348  AST 24 37 69*  ALT 13 19 43  ALKPHOS 68 57 57  BILITOT 0.9 0.8 0.6  PROT 7.6 6.4* 5.8*  ALBUMIN 4.1 3.2* 2.7*    CBG: No results for input(s): GLUCAP in the last 168 hours.  Recent Results (from the past 240 hour(s))  Urine culture     Status: Abnormal   Collection Time: 06/19/20  8:40 PM   Specimen: In/Out Cath Urine  Result Value Ref Range Status   Specimen Description   Final    IN/OUT CATH URINE Performed at [facility], [address] [place], [place] [address]    Special Requests   Final    NONE Performed at [facility], [address] [place], [place] [address]    Culture (A)  Final    70,000 COLONIES/mL GROUP B STREP(S.AGALACTIAE)ISOLATED TESTING AGAINST S. AGALACTIAE NOT ROUTINELY PERFORMED DUE TO PREDICTABILITY OF AMP/PEN/VAN SUSCEPTIBILITY. Performed at [facility], [address] [place], [place] [address]    Report Status 06/21/2020 FINAL  Final  Culture, blood (Routine x 2)     Status: Abnormal (Preliminary result)   Collection Time: 06/19/20  8:50 PM   Specimen: BLOOD RIGHT FOREARM  Result Value Ref Range Status   Specimen Description   Final    BLOOD RIGHT FOREARM Performed at [facility], [address] [place], [place] [address]    Special Requests   Final    BOTTLES DRAWN AEROBIC AND ANAEROBIC Blood Culture adequate volume Performed at [facility], [address] [place], [place] [address]    Culture  Setup Time   Final  GRAM POSITIVE COCCI AEROBIC AND ANAEROBIC BOTTLES Gram Stain Report Called to,Read Back By and Verified With: J. HEARN 3/31 @ 0639 BY S. BEARD Organism ID to follow CRITICAL RESULT CALLED TO, READ BACK BY AND VERIFIED WITH: PharmD G Coffee [id number] Performed at [facility], [address] [place], [place] [address]    Culture STREPTOCOCCUS GROUP G (A)  Final   Report Status PENDING  Incomplete  Blood Culture ID Panel (Reflexed)     Status: Abnormal   Collection Time: 06/19/20  8:50 PM  Result Value Ref Range Status   Enterococcus faecalis NOT DETECTED NOT DETECTED Final   Enterococcus Faecium NOT DETECTED NOT DETECTED Final   Listeria monocytogenes NOT DETECTED NOT DETECTED Final   Staphylococcus species NOT DETECTED NOT DETECTED Final   Staphylococcus aureus (BCID) NOT DETECTED NOT DETECTED Final   Staphylococcus epidermidis NOT DETECTED NOT DETECTED Final   Staphylococcus lugdunensis NOT DETECTED NOT DETECTED Final   Streptococcus species DETECTED (A) NOT DETECTED Final    Comment: Not Enterococcus species, Streptococcus agalactiae, Streptococcus pyogenes, or Streptococcus pneumoniae. CRITICAL RESULT CALLED TO, READ BACK BY AND VERIFIED WITH: PharmD Maia Breslow  [phone] by kj    Streptococcus agalactiae NOT DETECTED NOT DETECTED Final   Streptococcus pneumoniae NOT DETECTED NOT DETECTED  Final   Streptococcus pyogenes NOT DETECTED NOT DETECTED Final   A.calcoaceticus-baumannii NOT DETECTED NOT DETECTED Final   Bacteroides fragilis NOT DETECTED NOT DETECTED Final   Enterobacterales NOT DETECTED NOT DETECTED Final   Enterobacter cloacae complex NOT DETECTED NOT DETECTED Final   Escherichia coli NOT DETECTED NOT DETECTED Final   Klebsiella aerogenes NOT DETECTED NOT DETECTED Final   Klebsiella oxytoca NOT DETECTED NOT DETECTED Final   Klebsiella pneumoniae NOT DETECTED NOT DETECTED Final   Proteus species NOT DETECTED NOT DETECTED Final   Salmonella species NOT DETECTED NOT DETECTED Final   Serratia marcescens NOT DETECTED NOT DETECTED Final   Haemophilus influenzae NOT DETECTED NOT DETECTED Final   Neisseria meningitidis NOT DETECTED NOT DETECTED Final   Pseudomonas aeruginosa NOT DETECTED NOT DETECTED Final   Stenotrophomonas maltophilia NOT DETECTED NOT DETECTED Final   Candida albicans NOT DETECTED NOT DETECTED Final   Candida auris NOT DETECTED NOT DETECTED Final   Candida glabrata NOT DETECTED NOT DETECTED Final   Candida krusei NOT DETECTED NOT DETECTED Final   Candida parapsilosis NOT DETECTED NOT DETECTED Final   Candida tropicalis NOT DETECTED NOT DETECTED Final   Cryptococcus neoformans/gattii NOT DETECTED NOT DETECTED Final    Comment: Performed at [facility], [address] [place], [place] [address]  Resp Panel by RT-PCR (Flu A&B, Covid) Urine, Catheterized     Status: None   Collection Time: 06/19/20  8:55 PM   Specimen: Urine, Catheterized; Nasopharyngeal(NP) swabs in vial transport medium  Result Value Ref Range Status   SARS Coronavirus 2 by RT PCR NEGATIVE NEGATIVE Final    Comment: (NOTE) SARS-CoV-2 target nucleic acids are NOT DETECTED.  The SARS-CoV-2 RNA is generally detectable in upper respiratory specimens during the acute phase of infection. The lowest concentration of SARS-CoV-2 viral copies this assay can detect is 138 copies/mL.  A negative result does not preclude SARS-Cov-2 infection and should not be used as the sole basis for treatment or other patient management decisions. A negative result may occur with  improper specimen collection/handling, submission of specimen other than nasopharyngeal swab, presence of viral mutation(s) within the areas targeted by this assay, and inadequate number  of viral copies(<138 copies/mL). A negative result must be combined with clinical observations, patient history, and epidemiological information. The expected result is Negative.  Fact Sheet for Patients:  BloggerCourse.com  Fact Sheet for Healthcare Providers:  SeriousBroker.it  This test is no t yet approved or cleared by the [place] FDA and  has been authorized for detection and/or diagnosis of SARS-CoV-2 by FDA under an Emergency Use Authorization (EUA). This EUA will remain  in effect (meaning this test can be used) for the duration of the COVID-19 declaration under Section 564(b)(1) of the Act, 21 U.S.C.section 360bbb-3(b)(1), unless the authorization is terminated  or revoked sooner.       Influenza A by PCR NEGATIVE NEGATIVE Final   Influenza B by PCR NEGATIVE NEGATIVE Final    Comment: (NOTE) The Xpert Xpress SARS-CoV-2/FLU/RSV plus assay is intended as an aid in the diagnosis of influenza from Nasopharyngeal swab specimens and should not be used as a sole basis for treatment. Nasal washings and aspirates are unacceptable for Xpert Xpress SARS-CoV-2/FLU/RSV testing.  Fact Sheet for Patients: BloggerCourse.com  Fact Sheet for Healthcare Providers: SeriousBroker.it  This test is not yet approved or cleared by the [place] FDA and has been authorized for detection and/or diagnosis of SARS-CoV-2 by FDA under an Emergency Use Authorization (EUA). This EUA will remain in effect (meaning this test  can be used) for the duration of the COVID-19 declaration under Section 564(b)(1) of the Act, 21 U.S.C. section 360bbb-3(b)(1), unless the authorization is terminated or revoked.  Performed at [facility], [address] [place], [place] [address]   Culture, blood (Routine x 2)     Status: None (Preliminary result)   Collection Time: 06/19/20  9:36 PM   Specimen: Site Not Specified; Blood  Result Value Ref Range Status   Specimen Description SITE NOT SPECIFIED  Final   Special Requests   Final    BOTTLES DRAWN AEROBIC AND ANAEROBIC Blood Culture adequate volume   Culture   Final    NO GROWTH 2 DAYS Performed at [facility], [address] [place], [place] [address]    Report Status PENDING  Incomplete  MRSA PCR Screening     Status: None   Collection Time: 06/20/20  4:10 AM   Specimen: Nasal Mucosa; Nasopharyngeal  Result Value Ref Range Status   MRSA by PCR NEGATIVE NEGATIVE Final    Comment:        The GeneXpert MRSA Assay (FDA approved for NASAL specimens only), is one component of a comprehensive MRSA colonization surveillance program. It is not intended to diagnose MRSA infection nor to guide or monitor treatment for MRSA infections. Performed at [facility], [address], [place], [place] [address]      Radiology Studies: CT Head Wo Contrast  Result Date: 06/19/2020 CLINICAL DATA:  Mental status change fever EXAM: CT HEAD WITHOUT CONTRAST TECHNIQUE: Contiguous axial images were obtained from the base of the skull through the vertex without intravenous contrast. COMPARISON:  CT brain 05/01/2017 FINDINGS: Brain: No acute territorial infarction, hemorrhage or intracranial mass. Atrophy and mild chronic small vessel ischemic changes of the white matter. Stable ventricle size. Vascular: No hyperdense vessels.  Carotid vascular calcification. Skull: Normal. Negative for fracture or focal lesion. Sinuses/Orbits: No acute finding. Other: None IMPRESSION: 1. No CT evidence  for acute intracranial abnormality. 2. Atrophy and mild chronic small vessel ischemic changes of the white matter. Electronically Signed   By: Jasmine Pang M.D.   On: 06/19/2020 22:03  DG Chest Port 1 View  Result Date: 06/19/2020 CLINICAL DATA:  Fever EXAM: PORTABLE CHEST 1 VIEW COMPARISON:  05/12/2020 FINDINGS: Post sternotomy changes. No focal opacity or pleural effusion. Aortic atherosclerosis. Normal cardiac size. Scoliosis of the spine. IMPRESSION: No active disease. Electronically Signed   By: Jasmine Pang M.D.   On: 06/19/2020 21:11   Scheduled Meds: . amLODipine  10 mg Oral Daily  . aspirin EC  81 mg Oral Daily  . Chlorhexidine Gluconate Cloth  6 each Topical Daily  . citalopram  40 mg Oral Daily  . clopidogrel  75 mg Oral Daily  . heparin  5,000 Units Subcutaneous Q8H  . mouth rinse  15 mL Mouth Rinse BID  . memantine  10 mg Oral BID  . polyethylene glycol  17 g Oral Daily  . rosuvastatin  10 mg Oral QPM   Continuous Infusions: . sodium chloride 35 mL/hr at 06/21/20 0803  . cefTRIAXone (ROCEPHIN)  IV       LOS: 1 day   Time spent: 37 mins   Beyonca Wisz Laural Benes, MD How to contact the [facility] Attending or Consulting provider 7A - 7P or covering provider during after hours 7P -7A, for this patient?  1. Check the care team in [facility] and look for a) attending/consulting [facility] provider listed and b) the [facility] team listed 2. Log into [email or web address] and use [place]'s universal password to access. If you do not have the password, please contact the hospital operator. 3. Locate the [facility] provider you are looking for under [facility] and page to a number that you can be directly reached. 4. If you still have difficulty reaching the provider, please page the [place] (Director on Call) for the Hospitalists listed on amion for assistance.  06/21/2020, 2:24 PM

## 2020-06-22 LAB — COMPREHENSIVE METABOLIC PANEL
ALT: 34 U/L (ref 0–44)
AST: 46 U/L — ABNORMAL HIGH (ref 15–41)
Albumin: 2.5 g/dL — ABNORMAL LOW (ref 3.5–5.0)
Alkaline Phosphatase: 58 U/L (ref 38–126)
Anion gap: 9 (ref 5–15)
BUN: 21 mg/dL (ref 8–23)
CO2: 20 mmol/L — ABNORMAL LOW (ref 22–32)
Calcium: 8.1 mg/dL — ABNORMAL LOW (ref 8.9–10.3)
Chloride: 111 mmol/L (ref 98–111)
Creatinine, Ser: 0.77 mg/dL (ref 0.44–1.00)
GFR, Estimated: >60 mL/min (ref >60)
Glucose, Bld: 122 mg/dL — ABNORMAL HIGH (ref 70–99)
Potassium: 2.9 mmol/L — ABNORMAL LOW (ref 3.5–5.1)
Sodium: 140 mmol/L (ref 135–145)
Total Bilirubin: 0.8 mg/dL (ref 0.3–1.2)
Total Protein: 5.5 g/dL — ABNORMAL LOW (ref 6.5–8.1)

## 2020-06-22 LAB — URINE CULTURE: Culture: NO GROWTH

## 2020-06-22 LAB — CULTURE, BLOOD (ROUTINE X 2): Special Requests: ADEQUATE

## 2020-06-22 MED ORDER — ONDANSETRON HCL 4 MG/2ML IJ SOLN: 4.0000 mg | Freq: Four times a day (QID) | INTRAMUSCULAR | Status: DC | PRN

## 2020-06-22 MED ORDER — POTASSIUM CHLORIDE CRYS ER 20 MEQ PO TBCR: 40.0000 meq | EXTENDED_RELEASE_TABLET | Freq: Three times a day (TID) | ORAL | Status: DC

## 2020-06-22 MED ORDER — ONDANSETRON HCL 4 MG PO TABS: 4.0000 mg | ORAL_TABLET | Freq: Four times a day (QID) | ORAL | Status: DC | PRN

## 2020-06-22 MED ORDER — METOCLOPRAMIDE HCL 5 MG/ML IJ SOLN
5.0000 mg | Freq: Three times a day (TID) | INTRAMUSCULAR | Status: AC
  Administered 2020-06-22 – 2020-06-23 (×3): 5 mg via INTRAVENOUS
  Filled 2020-06-22 (×3): qty 2

## 2020-06-22 MED ORDER — PROCHLORPERAZINE EDISYLATE 10 MG/2ML IJ SOLN: 10.0000 mg | INTRAMUSCULAR | Status: DC | PRN

## 2020-06-22 MED ORDER — PROCHLORPERAZINE EDISYLATE 10 MG/2ML IJ SOLN: 10.0000 mg | Freq: Four times a day (QID) | INTRAMUSCULAR | Status: DC | PRN

## 2020-06-22 MED ORDER — POTASSIUM CHLORIDE 10 MEQ/100ML IV SOLN
10.0000 meq | INTRAVENOUS | Status: AC
  Administered 2020-06-22 (×4): 10 meq via INTRAVENOUS
  Filled 2020-06-22 (×4): qty 100

## 2020-06-22 NOTE — Progress Notes (Signed)
PROGRESS NOTE   Gloria Rowland  WUJ:[id number] DOB: 02-17-1933 DOA: 06/19/2020 PCP: Ignatius Specking, MD   No chief complaint on file.  Level of care: Med-Surg  Brief Admission History:  87 y.o. female, with history of spasmodic torticollis, nephrolithiasis, hypertension, hyperlipidemia, history of hepatitis, dementia, coronary artery disease, and more presents to the ED with a chief complaint of altered mental status.  Unfortunately patient is nonverbal at this time and any history, report and chart review patient is normally able to hold a clear conversation.  She does have signs of dementia with forgetfulness, but is usually alert and oriented x3.  Today patient was confused, and had decreased urine output and so they called EMS.  During her time in the ED she did have an episode of vomiting.  Patient also had a fever in the ED temperature of 102.5, for which she was given Tylenol.  She was tachycardic up to 118, tachypneic up to 24, had soft pressures as low as 95/51, and a white blood cell count of 19.1.  Her initial lactic acid was 5.8, and improved to 2.3 after 1.750 L of lactated Ringer's.  Patient was initially started on broad-spectrum antibiotics with cefepime, Vanco, Flagyl.  Her UA then came back very consistent with UTI.  Urine culture was ordered.  Blood cultures were also ordered.  Head CT was done that showed no evidence for acute intracranial abnormality.  Chest x-ray was done that showed no active disease.  Chemistry panel revealed a hyperglycemia at 244, and an AKI.  Baseline creatinine is 0.9 and today was 1.54.  Patient also had a leukocytosis at 19.1.  Covid and flu were negative.  Admission was requested.  Further work-up and treatment of sepsis.  Patient is admitted to stepdown due to her softer pressures.  Assessment & Plan:   Active Problems:   Palliative care by specialist   Sepsis (HCC)   Lactic acidosis   Acute lower UTI   AKI (acute kidney injury) (HCC)   Acute  metabolic encephalopathy  1. Group B Strep Sepsis - sepsis physiology is improving with therapy.   2. GBS UTI - Afebrile, antibiotics de-escalated to ceftriaxone 2gm IV every 24 hours.  Continue supportive care. Pt vomiting now, plan to continue IV antibiotics until vomiting improved.  3. Acute metabolic encephalopathy - improved with supportive therapies.  4. Lactic acidosis - resolved after IV fluids.  5. Hypokalemia - IV replacement ordered.  6. AKI - renal function is improving.  7. Prolonged QTc - get another EKG in AM to follow up.  Mg in AM.  8. DNR present on admission - daughter confirmed.   DVT prophylaxis:  SCDs Code Status: DNR  Family Communication: daughter updated telephone 4/2 Disposition: Anticipate SNF placement Status is: Inpatient  Remains inpatient appropriate because:IV treatments appropriate due to intensity of illness or inability to take PO and Inpatient level of care appropriate due to severity of illness  Dispo: The patient is from: Home              Anticipated d/c is to: Home              Patient currently is not medically stable to d/c.   Difficult to place patient No   Consultants:   Palliative care  Procedures:     Antimicrobials:  Cefepime 3/30>> Ceftriaxone 4/1>>  Subjective: Pt having some nausea and vomiting.    Objective: Vitals:   06/22/20 0231 06/22/20 0530 06/22/20 0943 06/22/20 1353  BP: (!) 148/72 (!) 146/81 (!) 152/84 (!) 150/87  Pulse: (!) 58 61 (!) 58 (!) 59  Resp: 18 17  17   Temp: 97.7 F (36.5 C) 98.2 F (36.8 C) 97.8 F (36.6 C) 98.6 F (37 C)  TempSrc:   Oral Oral  SpO2: 99% 100% 98% 100%  Weight:      Height:        Intake/Output Summary (Last 24 hours) at 06/22/2020 1440 Last data filed at 06/22/2020 1349 Gross per 24 hour  Intake 2060 ml  Output 1600 ml  Net 460 ml   Filed Weights   06/20/20 0441 06/21/20 0500 06/21/20 1449  Weight: 53.4 kg 53.4 kg 54.4 kg    Examination:  General exam: frail,  elderly female, appears chronically ill, Appears calm and comfortable, somnolent but arousable.  Respiratory system: Clear to auscultation. Respiratory effort normal. Cardiovascular system: normal S1 & S2 heard. No JVD, murmurs, rubs, gallops or clicks. No pedal edema. Gastrointestinal system: Abdomen is nondistended, soft and nontender. No organomegaly or masses felt. Normal bowel sounds heard. Central nervous system: Alert and oriented. No focal neurological deficits. Extremities: Symmetric 5 x 5 power. Skin: No rashes, lesions or ulcers Psychiatry: Judgement and insight delayed. Mood & affect appropriate.   Data Reviewed: I have personally reviewed following labs and imaging studies  CBC: Recent Labs  Lab 06/19/20 2050 06/20/20 0604 06/21/20 0348  WBC 19.1* 16.2* 9.3  NEUTROABS 16.4* 14.7* 8.0*  HGB 10.6* 9.5* 9.5*  HCT 33.0* 29.9* 29.6*  MCV 95.9 97.4 97.4  PLT 262 160 141*    Basic Metabolic Panel: Recent Labs  Lab 06/19/20 2050 06/20/20 0604 06/21/20 0348 06/22/20 0630  NA 135 138 141 140  K 4.2 3.6 3.4* 2.9*  CL 101 107 113* 111  CO2 17* 21* 20* 20*  GLUCOSE 244* 165* 123* 122*  BUN 32* 27* 27* 21  CREATININE 1.54* 1.29* 0.99 0.77  CALCIUM 8.9 8.2* 8.1* 8.1*  MG  --  1.8 1.9  --     GFR: Estimated Creatinine Clearance: 43.4 mL/min (by C-G formula based on SCr of 0.77 mg/dL).  Liver Function Tests: Recent Labs  Lab 06/19/20 2050 06/20/20 0604 06/21/20 0348 06/22/20 0630  AST 24 37 69* 46*  ALT 13 19 43 34  ALKPHOS 68 57 57 58  BILITOT 0.9 0.8 0.6 0.8  PROT 7.6 6.4* 5.8* 5.5*  ALBUMIN 4.1 3.2* 2.7* 2.5*    CBG: No results for input(s): GLUCAP in the last 168 hours.  Recent Results (from the past 240 hour(s))  Urine culture     Status: Abnormal   Collection Time: 06/19/20  8:40 PM   Specimen: In/Out Cath Urine  Result Value Ref Range Status   Specimen Description   Final    IN/OUT CATH URINE Performed at [facility], [address],  [place], [place]    Special Requests   Final    NONE Performed at [facility], [address], [place], [place]    Culture (A)  Final    70,000 COLONIES/mL GROUP B STREP(S.AGALACTIAE)ISOLATED TESTING AGAINST S. AGALACTIAE NOT ROUTINELY PERFORMED DUE TO PREDICTABILITY OF AMP/PEN/VAN SUSCEPTIBILITY. Performed at [facility], [address] [place], [place]    Report Status 06/21/2020 FINAL  Final  Culture, blood (Routine x 2)     Status: Abnormal (Preliminary result)   Collection Time: 06/19/20  8:50 PM   Specimen: BLOOD RIGHT FOREARM  Result Value Ref Range Status  Specimen Description   Final    BLOOD RIGHT FOREARM Performed at [facility], [address] [place], [place] [address]    Special Requests   Final    BOTTLES DRAWN AEROBIC AND ANAEROBIC Blood Culture adequate volume Performed at [facility], [address], [place], [place] [address]    Culture  Setup Time   Final    GRAM POSITIVE COCCI AEROBIC AND ANAEROBIC BOTTLES Gram Stain Report Called to,Read Back By and Verified With: J. HEARN 3/31 @ 0639 BY S. BEARD Organism ID to follow CRITICAL RESULT CALLED TO, READ BACK BY AND VERIFIED WITH: PharmD G Coffee [id number] Performed at [facility], [address], [place], [place] [address]    Culture STREPTOCOCCUS GROUP G (A)  Final   Report Status PENDING  Incomplete  Blood Culture ID Panel (Reflexed)     Status: Abnormal   Collection Time: 06/19/20  8:50 PM  Result Value Ref Range Status   Enterococcus faecalis NOT DETECTED NOT DETECTED Final   Enterococcus Faecium NOT DETECTED NOT DETECTED Final   Listeria monocytogenes NOT DETECTED NOT DETECTED Final   Staphylococcus species NOT DETECTED NOT DETECTED Final   Staphylococcus aureus (BCID) NOT DETECTED NOT DETECTED Final   Staphylococcus epidermidis NOT DETECTED NOT DETECTED Final   Staphylococcus lugdunensis NOT DETECTED NOT DETECTED Final   Streptococcus species  DETECTED (A) NOT DETECTED Final    Comment: Not Enterococcus species, Streptococcus agalactiae, Streptococcus pyogenes, or Streptococcus pneumoniae. CRITICAL RESULT CALLED TO, READ BACK BY AND VERIFIED WITH: PharmD Maia Breslow  [id number] [phone] by kj    Streptococcus agalactiae NOT DETECTED NOT DETECTED Final   Streptococcus pneumoniae NOT DETECTED NOT DETECTED Final   Streptococcus pyogenes NOT DETECTED NOT DETECTED Final   A.calcoaceticus-baumannii NOT DETECTED NOT DETECTED Final   Bacteroides fragilis NOT DETECTED NOT DETECTED Final   Enterobacterales NOT DETECTED NOT DETECTED Final   Enterobacter cloacae complex NOT DETECTED NOT DETECTED Final   Escherichia coli NOT DETECTED NOT DETECTED Final   Klebsiella aerogenes NOT DETECTED NOT DETECTED Final   Klebsiella oxytoca NOT DETECTED NOT DETECTED Final   Klebsiella pneumoniae NOT DETECTED NOT DETECTED Final   Proteus species NOT DETECTED NOT DETECTED Final   Salmonella species NOT DETECTED NOT DETECTED Final   Serratia marcescens NOT DETECTED NOT DETECTED Final   Haemophilus influenzae NOT DETECTED NOT DETECTED Final   Neisseria meningitidis NOT DETECTED NOT DETECTED Final   Pseudomonas aeruginosa NOT DETECTED NOT DETECTED Final   Stenotrophomonas maltophilia NOT DETECTED NOT DETECTED Final   Candida albicans NOT DETECTED NOT DETECTED Final   Candida auris NOT DETECTED NOT DETECTED Final   Candida glabrata NOT DETECTED NOT DETECTED Final   Candida krusei NOT DETECTED NOT DETECTED Final   Candida parapsilosis NOT DETECTED NOT DETECTED Final   Candida tropicalis NOT DETECTED NOT DETECTED Final   Cryptococcus neoformans/gattii NOT DETECTED NOT DETECTED Final    Comment: Performed at [facility], [address] [place], [place] [address]  Resp Panel by RT-PCR (Flu A&B, Covid) Urine, Catheterized     Status: None   Collection Time: 06/19/20  8:55 PM   Specimen: Urine, Catheterized; Nasopharyngeal(NP) swabs in vial transport medium   Result Value Ref Range Status   SARS Coronavirus 2 by RT PCR NEGATIVE NEGATIVE Final    Comment: (NOTE) SARS-CoV-2 target nucleic acids are NOT DETECTED.  The SARS-CoV-2 RNA is generally detectable in upper respiratory specimens during the acute phase of infection. The lowest concentration  of SARS-CoV-2 viral copies this assay can detect is 138 copies/mL. A negative result does not preclude SARS-Cov-2 infection and should not be used as the sole basis for treatment or other patient management decisions. A negative result may occur with  improper specimen collection/handling, submission of specimen other than nasopharyngeal swab, presence of viral mutation(s) within the areas targeted by this assay, and inadequate number of viral copies(<138 copies/mL). A negative result must be combined with clinical observations, patient history, and epidemiological information. The expected result is Negative.  Fact Sheet for Patients:  BloggerCourse.com  Fact Sheet for Healthcare Providers:  SeriousBroker.it  This test is no t yet approved or cleared by the [place] FDA and  has been authorized for detection and/or diagnosis of SARS-CoV-2 by FDA under an Emergency Use Authorization (EUA). This EUA will remain  in effect (meaning this test can be used) for the duration of the COVID-19 declaration under Section 564(b)(1) of the Act, 21 U.S.C.section 360bbb-3(b)(1), unless the authorization is terminated  or revoked sooner.       Influenza A by PCR NEGATIVE NEGATIVE Final   Influenza B by PCR NEGATIVE NEGATIVE Final    Comment: (NOTE) The Xpert Xpress SARS-CoV-2/FLU/RSV plus assay is intended as an aid in the diagnosis of influenza from Nasopharyngeal swab specimens and should not be used as a sole basis for treatment. Nasal washings and aspirates are unacceptable for Xpert Xpress SARS-CoV-2/FLU/RSV testing.  Fact Sheet for  Patients: BloggerCourse.com  Fact Sheet for Healthcare Providers: SeriousBroker.it  This test is not yet approved or cleared by the [place] FDA and has been authorized for detection and/or diagnosis of SARS-CoV-2 by FDA under an Emergency Use Authorization (EUA). This EUA will remain in effect (meaning this test can be used) for the duration of the COVID-19 declaration under Section 564(b)(1) of the Act, 21 U.S.C. section 360bbb-3(b)(1), unless the authorization is terminated or revoked.  Performed at [facility], [address], [place], [place] [address]   Culture, blood (Routine x 2)     Status: None (Preliminary result)   Collection Time: 06/19/20  9:36 PM   Specimen: Site Not Specified; Blood  Result Value Ref Range Status   Specimen Description SITE NOT SPECIFIED  Final   Special Requests   Final    BOTTLES DRAWN AEROBIC AND ANAEROBIC Blood Culture adequate volume   Culture   Final    NO GROWTH 3 DAYS Performed at [facility], [address] [place], [place] [address]    Report Status PENDING  Incomplete  MRSA PCR Screening     Status: None   Collection Time: 06/20/20  4:10 AM   Specimen: Nasal Mucosa; Nasopharyngeal  Result Value Ref Range Status   MRSA by PCR NEGATIVE NEGATIVE Final    Comment:        The GeneXpert MRSA Assay (FDA approved for NASAL specimens only), is one component of a comprehensive MRSA colonization surveillance program. It is not intended to diagnose MRSA infection nor to guide or monitor treatment for MRSA infections. Performed at [facility], [address] [place], [place] [address]   Urine culture     Status: None   Collection Time: 06/20/20  4:30 PM   Specimen: Urine, Catheterized  Result Value Ref Range Status   Specimen Description   Final    URINE, CATHETERIZED Performed at [facility], [address], [place], [place] [address]    Special Requests   Final     NONE Performed at [facility], [address] [place], [place] [address]    Culture   Final    NO GROWTH Performed at [facility], [address] [place], [place] [address]    Report Status 06/22/2020 FINAL  Final    Radiology Studies: No results found. Scheduled Meds: . amLODipine  10 mg Oral Daily  . aspirin EC  81 mg Oral Daily  . Chlorhexidine Gluconate Cloth  6 each Topical Daily  . citalopram  40 mg Oral Daily  . clopidogrel  75 mg Oral Daily  . heparin  5,000 Units Subcutaneous Q8H  . mouth rinse  15 mL Mouth Rinse BID  . memantine  10 mg Oral BID  . polyethylene glycol  17 g Oral Daily  . rosuvastatin  10 mg Oral QPM   Continuous Infusions: . sodium chloride 35 mL/hr at 06/21/20 2142  . cefTRIAXone (ROCEPHIN)  IV 2 g (06/21/20 1526)  . potassium chloride 10 mEq (06/22/20 1436)     LOS: 2 days   Time spent: 36 mins   Gloria Larrivee Laural BenesJohnson, MD How to contact the [facility] Attending or Consulting provider 7A - 7P or covering provider during after hours 7P -7A, for this patient?  1. Check the care team in [facility] and look for a) attending/consulting [facility] provider listed and b) the [facility] team listed 2. Log into [email or web address] and use [place]'s universal password to access. If you do not have the password, please contact the hospital operator. 3. Locate the [facility] provider you are looking for under [facility] and page to a number that you can be directly reached. 4. If you still have difficulty reaching the provider, please page the [facility] (Director on Call) for the Hospitalists listed on amion for assistance.  06/22/2020, 2:40 PM

## 2020-06-23 LAB — BASIC METABOLIC PANEL
Anion gap: 8 (ref 5–15)
BUN: 17 mg/dL (ref 8–23)
CO2: 22 mmol/L (ref 22–32)
Calcium: 7.7 mg/dL — ABNORMAL LOW (ref 8.9–10.3)
Chloride: 108 mmol/L (ref 98–111)
Creatinine, Ser: 0.77 mg/dL (ref 0.44–1.00)
GFR, Estimated: >60 mL/min (ref >60)
Glucose, Bld: 118 mg/dL — ABNORMAL HIGH (ref 70–99)
Potassium: 3.5 mmol/L (ref 3.5–5.1)
Sodium: 138 mmol/L (ref 135–145)

## 2020-06-23 LAB — MAGNESIUM: Magnesium: 1.9 mg/dL (ref 1.7–2.4)

## 2020-06-23 NOTE — Evaluation (Signed)
Physical Therapy Evaluation Patient Details Name: Gloria Rowland MRN: [id number] DOB: 1932/06/25 Today's Date: 06/23/2020   History of Present Illness  Gloria Rowland  is a 87 y.o. female, with history of spasmodic torticollis, nephrolithiasis, hypertension, hyperlipidemia, history of hepatitis, dementia, coronary artery disease, and more presents to the ED with a chief complaint of altered mental status.  Unfortunately patient is nonverbal at this time and any history, report and chart review patient is normally able to hold a clear conversation.  She does have signs of dementia with forgetfulness, but is usually alert and oriented x3.  Today patient was confused, and had decreased urine output and so they called EMS.  During her time in the ED she did have an episode of vomiting.  Patient also had a fever in the ED temperature of 102.5, for which she was given Tylenol.  She was tachycardic up to 118, tachypneic up to 24, had soft pressures as low as 95/51, and a white blood cell count of 19.1.  Her initial lactic acid was 5.8, and improved to 2.3 after 1.750 L of lactated Ringer's.  Patient was initially started on broad-spectrum antibiotics with cefepime, Vanco, Flagyl.  Her UA then came back very consistent with UTI.  Urine culture was ordered.  Blood cultures were also ordered.  Head CT was done that showed no evidence for acute intracranial abnormality.  Chest x-ray was done that showed no active disease.  Chemistry panel revealed a hyperglycemia at 244, and an AKI.  Baseline creatinine is 0.9 and today was 1.54.  Patient also had a leukocytosis at 19.1.  Covid and flu were negative.  Admission was requested.  Further work-up and treatment of sepsis.  Patient is admitted to stepdown due to her softer pressures.     Clinical Impression  Patient limited for functional mobility as stated below secondary to BLE weakness, fatigue and impaired sitting balance. Patient appears to be a poor historian and is  confused. She provides very limited details regarding home set up and PLOF. Patient requires assist for LE movement and uprighting trunk to transition to seated EOB. She demonstrates impaired sitting balance and tolerance due to fatigue. Patient assisted back to bed at end of session. Patient will benefit from continued physical therapy in hospital and recommended venue below to increase strength, balance, endurance for safe ADLs and gait.     Follow Up Recommendations SNF    Equipment Recommendations  None recommended by PT    Recommendations for Other Services       Precautions / Restrictions Precautions Precautions: Fall Restrictions Weight Bearing Restrictions: No      Mobility  Bed Mobility Overal bed mobility: Needs Assistance Bed Mobility: Supine to Sit;Sit to Supine     Supine to sit: Mod assist;HOB elevated Sit to supine: Mod assist   General bed mobility comments: slow, labored movements requiring assist for LE movement and uprighting trunk; verbal and tactile cueing throughout    Transfers                    Ambulation/Gait                Stairs            Wheelchair Mobility    Modified Rankin (Stroke Patients Only)       Balance Overall balance assessment: Needs assistance Sitting-balance support: Bilateral upper extremity supported Sitting balance-Leahy Scale: Fair Sitting balance - Comments: seated EOB  Pertinent Vitals/Pain Pain Assessment: No/denies pain    Home Living Family/patient expects to be discharged to:: Private residence Living Arrangements: Children Available Help at Discharge: Family             Additional Comments: pt is a poor historian, confused and unable to provide home set up details, info from chart    Prior Function Level of Independence: Needs assistance   Gait / Transfers Assistance Needed: ambulated with walker     Comments: Patient  appears to be a poor historian, PLOF details limited     Hand Dominance        Extremity/Trunk Assessment   Upper Extremity Assessment Upper Extremity Assessment: Generalized weakness    Lower Extremity Assessment Lower Extremity Assessment: Generalized weakness    Cervical / Trunk Assessment Cervical / Trunk Assessment: Kyphotic  Communication   Communication: HOH  Cognition Arousal/Alertness: Awake/alert Behavior During Therapy: WFL for tasks assessed/performed Overall Cognitive Status: No family/caregiver present to determine baseline cognitive functioning                                        General Comments      Exercises     Assessment/Plan    PT Assessment Patient needs continued PT services  PT Problem List Decreased strength;Decreased activity tolerance;Decreased balance;Decreased mobility       PT Treatment Interventions DME instruction;Balance training;Gait training;Neuromuscular re-education;Stair training;Functional mobility training;Patient/family education;Therapeutic activities;Therapeutic exercise    PT Goals (Current goals can be found in the Care Plan section)  Acute Rehab PT Goals Patient Stated Goal: Return home PT Goal Formulation: With patient Time For Goal Achievement: 07/07/20 Potential to Achieve Goals: Fair    Frequency Min 3X/week   Barriers to discharge        Co-evaluation               AM-PAC PT "6 Clicks" Mobility  Outcome Measure Help needed turning from your back to your side while in a flat bed without using bedrails?: A Little Help needed moving from lying on your back to sitting on the side of a flat bed without using bedrails?: A Lot Help needed moving to and from a bed to a chair (including a wheelchair)?: A Lot Help needed standing up from a chair using your arms (e.g., wheelchair or bedside chair)?: A Lot Help needed to walk in hospital room?: Total Help needed climbing 3-5 steps with a  railing? : Total 6 Click Score: 11    End of Session   Activity Tolerance: Patient limited by fatigue Patient left: in bed;with call bell/phone within reach;with bed alarm set Nurse Communication: Mobility status PT Visit Diagnosis: Unsteadiness on feet (R26.81);Other abnormalities of gait and mobility (R26.89);Muscle weakness (generalized) (M62.81)    Time: 2505-3976 PT Time Calculation (min) (ACUTE ONLY): 9 min   Charges:   PT Evaluation $PT Eval Low Complexity: 1 Low          10:50 AM, 06/23/20 Wyman Songster PT, DPT Physical Therapist at [facility]

## 2020-06-23 NOTE — [place] FL2 (Signed)
[place] MEDICAID FL2 LEVEL OF CARE SCREENING TOOL     IDENTIFICATION  Patient Name: Gloria Rowland Birthdate: 01/30/1933 Sex: female Admission Date (Current Location): 06/19/2020  [facility] and [place] Number:  [employer] and Address:  [facility],  [address] [place] [address]      Provider Number: [id number]  Attending Physician Name and Address:  Cleora Fleet, MD  Relative Name and Phone Number:  [address]   [place] [address]    Current Level of Care: Hospital Recommended Level of Care: Skilled Nursing Facility Prior Approval Number:    Date Approved/Denied: 06/23/20 PASRR Number: [id number]  Discharge Plan: SNF    Current Diagnoses: Patient Active Problem List   Diagnosis Date Noted  . Sepsis (HCC) 06/20/2020  . Lactic acidosis 06/20/2020  . Acute lower UTI 06/20/2020  . AKI (acute kidney injury) (HCC) 06/20/2020  . Acute metabolic encephalopathy 06/20/2020  . Palliative care by specialist   . NSTEMI (non-ST elevated myocardial infarction) (HCC) 05/13/2020  . Hypokalemia 05/13/2020  . Chronic kidney disease, stage 3a (HCC) 05/13/2020  . Hyperglycemia 05/13/2020  . Severe protein-calorie malnutrition (HCC) 05/13/2020  . Normocytic anemia 05/13/2020  . Intractable nausea and vomiting 04/10/2018  . Hydronephrosis of left kidney 04/10/2018  . Dementia without behavioral disturbance (HCC) 04/10/2018  . Pain of upper abdomen   . Abdominal pain, epigastric 12/24/2016  . Hiatal hernia 05/27/2011  . CAD (coronary artery disease)   . S/P CABG (coronary artery bypass graft)   . Hyperlipidemia   . Hypertension   . Nephrolithiasis   . Lumbar degenerative disc disease   . Dementia (HCC)     Orientation RESPIRATION BLADDER Height & Weight     Self  Normal Incontinent Weight: 119 lb 14.9 oz (54.4 kg) Height:  5\' 6"  (167.6 cm)  BEHAVIORAL SYMPTOMS/MOOD NEUROLOGICAL BOWEL NUTRITION STATUS      Incontinent Diet  (DIET DYS 3 Room service appropriate? Yes; Fluid consistency: Thin)  AMBULATORY STATUS COMMUNICATION OF NEEDS Skin   Extensive Assist Verbally Normal                       Personal Care Assistance Level of Assistance  Bathing,Feeding,Dressing Bathing Assistance: Maximum assistance Feeding assistance: Maximum assistance Dressing Assistance: Maximum assistance     Functional Limitations Info  Sight,Hearing,Speech Sight Info: Adequate Hearing Info: Adequate Speech Info: Adequate    SPECIAL CARE FACTORS FREQUENCY  PT (By licensed PT)     PT Frequency: 5x              Contractures Contractures Info: Not present    Additional Factors Info  Code Status,Allergies Code Status Info: DNR Allergies Info: Asprin Codeine           Current Medications (06/23/2020):  This is the current hospital active medication list Current Facility-Administered Medications  Medication Dose Route Frequency Provider Last Rate Last Admin  . 0.9 %  sodium chloride infusion   Intravenous Continuous 08/23/2020, MD   Stopped at 06/22/20 1756  . acetaminophen (TYLENOL) suppository 650 mg  650 mg Rectal Q4H PRN Zierle-Ghosh, Asia B, DO   650 mg at 06/20/20 0719  . amLODipine (NORVASC) tablet 10 mg  10 mg Oral Daily Zierle-Ghosh, Asia B, DO   10 mg at 06/23/20 1000  . aspirin EC tablet 81 mg  81 mg Oral Daily Zierle-Ghosh, Asia B, DO   81 mg at 06/23/20 1000  . cefTRIAXone (ROCEPHIN)  2 g in sodium chloride 0.9 % 100 mL IVPB  2 g Intravenous Q24H Cleora Fleet, MD   Stopped at 06/22/20 1826  . Chlorhexidine Gluconate Cloth 2 % PADS 6 each  6 each Topical Daily Johnson, Clanford L, MD   6 each at 06/23/20 1000  . citalopram (CELEXA) tablet 40 mg  40 mg Oral Daily Zierle-Ghosh, Asia B, DO   40 mg at 06/23/20 1001  . clopidogrel (PLAVIX) tablet 75 mg  75 mg Oral Daily Zierle-Ghosh, Asia B, DO   75 mg at 06/23/20 1001  . heparin injection 5,000 Units  5,000 Units Subcutaneous Q8H  Zierle-Ghosh, Asia B, DO   5,000 Units at 06/23/20 1438  . hydrALAZINE (APRESOLINE) injection 10 mg  10 mg Intravenous Q4H PRN Johnson, Clanford L, MD      . MEDLINE mouth rinse  15 mL Mouth Rinse BID Zierle-Ghosh, Asia B, DO   15 mL at 06/23/20 1420  . memantine (NAMENDA) tablet 10 mg  10 mg Oral BID Zierle-Ghosh, Asia B, DO   10 mg at 06/23/20 1001  . ondansetron (ZOFRAN) tablet 4 mg  4 mg Oral Q6H PRN Johnson, Clanford L, MD       Or  . ondansetron (ZOFRAN) injection 4 mg  4 mg Intravenous Q6H PRN Johnson, Clanford L, MD      . polyethylene glycol (MIRALAX / GLYCOLAX) packet 17 g  17 g Oral Daily Zierle-Ghosh, Asia B, DO   17 g at 06/23/20 1000  . prochlorperazine (COMPAZINE) injection 10 mg  10 mg Intravenous Q6H PRN Johnson, Clanford L, MD      . rosuvastatin (CRESTOR) tablet 10 mg  10 mg Oral QPM Johnson, Clanford L, MD   10 mg at 06/22/20 1753     Discharge Medications: Please see discharge summary for a list of discharge medications.  Relevant Imaging Results:  Relevant Lab Results:   Additional Information Pt SSN: [id number]  Barry Brunner, LCSW

## 2020-06-23 NOTE — Plan of Care (Signed)
  Problem: Acute Rehab PT Goals(only PT should resolve) Goal: Pt Will Go Supine/Side To Sit Outcome: Progressing Flowsheets (Taken 06/23/2020 1051) Pt will go Supine/Side to Sit: with minimal assist Goal: Pt Will Go Sit To Supine/Side Outcome: Progressing Flowsheets (Taken 06/23/2020 1051) Pt will go Sit to Supine/Side: with minimal assist Goal: Patient Will Transfer Sit To/From Stand Outcome: Progressing Flowsheets (Taken 06/23/2020 1051) Patient will transfer sit to/from stand:  with minimal assist  with moderate assist Goal: Pt Will Transfer Bed To Chair/Chair To Bed Outcome: Progressing Flowsheets (Taken 06/23/2020 1051) Pt will Transfer Bed to Chair/Chair to Bed:  with min assist  with mod assist  10:52 AM, 06/23/20 Wyman Songster PT, DPT Physical Therapist at [facility]

## 2020-06-23 NOTE — Plan of Care (Signed)
Alert and oriented to person. Denies pain or discomfort at this time. IV fluids running and tolerated well. Needs 1 person assist with adls. Continue plan of care. Problem: Education: Goal: Knowledge of General Education information will improve Description: Including pain rating scale, medication(s)/side effects and non-pharmacologic comfort measures Outcome: Progressing   Problem: Health Behavior/Discharge Planning: Goal: Ability to manage health-related needs will improve Outcome: Progressing   Problem: Clinical Measurements: Goal: Ability to maintain clinical measurements within normal limits will improve Outcome: Progressing Goal: Will remain free from infection Outcome: Progressing Goal: Diagnostic test results will improve Outcome: Progressing Goal: Respiratory complications will improve Outcome: Progressing Goal: Cardiovascular complication will be avoided Outcome: Progressing   Problem: Activity: Goal: Risk for activity intolerance will decrease Outcome: Progressing   Problem: Nutrition: Goal: Adequate nutrition will be maintained Outcome: Progressing   Problem: Coping: Goal: Level of anxiety will decrease Outcome: Progressing   Problem: Elimination: Goal: Will not experience complications related to bowel motility Outcome: Progressing Goal: Will not experience complications related to urinary retention Outcome: Progressing   Problem: Pain Managment: Goal: General experience of comfort will improve Outcome: Progressing   Problem: Safety: Goal: Ability to remain free from injury will improve Outcome: Progressing   Problem: Skin Integrity: Goal: Risk for impaired skin integrity will decrease Outcome: Progressing

## 2020-06-23 NOTE — Progress Notes (Signed)
PROGRESS NOTE   Gloria Rowland  YKD:[id number] DOB: January 11, 1933 DOA: 06/19/2020 PCP: Ignatius Specking, MD   No chief complaint on file.  Level of care: Med-Surg  Brief Admission History:  87 y.o. female, with history of spasmodic torticollis, nephrolithiasis, hypertension, hyperlipidemia, history of hepatitis, dementia, coronary artery disease, and more presents to the ED with a chief complaint of altered mental status.  Unfortunately patient is nonverbal at this time and any history, report and chart review patient is normally able to hold a clear conversation.  She does have signs of dementia with forgetfulness, but is usually alert and oriented x3.  Today patient was confused, and had decreased urine output and so they called EMS.  During her time in the ED she did have an episode of vomiting.  Patient also had a fever in the ED temperature of 102.5, for which she was given Tylenol.  She was tachycardic up to 118, tachypneic up to 24, had soft pressures as low as 95/51, and a white blood cell count of 19.1.  Her initial lactic acid was 5.8, and improved to 2.3 after 1.750 L of lactated Ringer's.  Patient was initially started on broad-spectrum antibiotics with cefepime, Vanco, Flagyl.  Her UA then came back very consistent with UTI.  Urine culture was ordered.  Blood cultures were also ordered.  Head CT was done that showed no evidence for acute intracranial abnormality.  Chest x-ray was done that showed no active disease.  Chemistry panel revealed a hyperglycemia at 244, and an AKI.  Baseline creatinine is 0.9 and today was 1.54.  Patient also had a leukocytosis at 19.1.  Covid and flu were negative.  Admission was requested.  Further work-up and treatment of sepsis.  Patient is admitted to stepdown due to her softer pressures.  Assessment & Plan:   Active Problems:   Palliative care by specialist   Sepsis (HCC)   Lactic acidosis   Acute lower UTI   AKI (acute kidney injury) (HCC)   Acute  metabolic encephalopathy  1. Group B Strep Sepsis - sepsis physiology is improving with therapy.   2. GBS UTI - Afebrile, antibiotics de-escalated to ceftriaxone 2gm IV every 24 hours.  Continue supportive care. Plan to continue IV antibiotics until vomiting improved.  3. Acute metabolic encephalopathy - improved with supportive therapies.  4. Lactic acidosis - resolved after IV fluids.  5. Hypokalemia - IV replacement ordered and repleted.  6. AKI - renal function is improved.  7. Prolonged QTc - Resolved according to morning EKG 4/3.  8. DNR present on admission - daughter confirmed.   DVT prophylaxis:  SCDs Code Status: DNR  Family Communication: daughter updated telephone 4/2 (requesting SNF placement) Disposition: Anticipate SNF placement Status is: Inpatient  Remains inpatient appropriate because:IV treatments appropriate due to intensity of illness or inability to take PO and Inpatient level of care appropriate due to severity of illness  Dispo: The patient is from: Home              Anticipated d/c is to: Anticipate SNF placement              Patient currently is not medically stable to d/c.   Difficult to place patient No   Consultants:   Palliative care  Procedures:     Antimicrobials:  Cefepime 3/30>>4/1 Ceftriaxone 4/1>>  Subjective: Pt denies any specific complaints today.     Objective: Vitals:   06/22/20 1353 06/22/20 2033 06/23/20 0429 06/23/20 0736  BP: Marland Kitchen)  150/87 110/68 123/77 126/69  Pulse: (!) 59 65 64 (!) 59  Resp: 17 20 18 18   Temp: 98.6 F (37 C) 98.3 F (36.8 C) 97.8 F (36.6 C) 98.1 F (36.7 C)  TempSrc: Oral  Axillary Axillary  SpO2: 100% 98% 98% 98%  Weight:      Height:        Intake/Output Summary (Last 24 hours) at 06/23/2020 1141 Last data filed at 06/23/2020 0900 Gross per 24 hour  Intake 1024.76 ml  Output 1000 ml  Net 24.76 ml   Filed Weights   06/20/20 0441 06/21/20 0500 06/21/20 1449  Weight: 53.4 kg 53.4 kg 54.4 kg     Examination:  General exam: frail, elderly female, appears chronically ill, Appears calm and comfortable, somnolent but arousable.  Respiratory system: Clear to auscultation. Respiratory effort normal. Cardiovascular system: normal S1 & S2 heard. No JVD, murmurs, rubs, gallops or clicks. No pedal edema. Gastrointestinal system: Abdomen is nondistended, soft and nontender. No organomegaly or masses felt. Normal bowel sounds heard. Central nervous system: Alert and oriented. No focal neurological deficits. Extremities: Symmetric 5 x 5 power. Skin: No rashes, lesions or ulcers Psychiatry: Judgement and insight delayed. Mood & affect appropriate.   Data Reviewed: I have personally reviewed following labs and imaging studies  CBC: Recent Labs  Lab 06/19/20 2050 06/20/20 0604 06/21/20 0348  WBC 19.1* 16.2* 9.3  NEUTROABS 16.4* 14.7* 8.0*  HGB 10.6* 9.5* 9.5*  HCT 33.0* 29.9* 29.6*  MCV 95.9 97.4 97.4  PLT 262 160 141*    Basic Metabolic Panel: Recent Labs  Lab 06/19/20 2050 06/20/20 0604 06/21/20 0348 06/22/20 0630 06/23/20 0452  NA 135 138 141 140 138  K 4.2 3.6 3.4* 2.9* 3.5  CL 101 107 113* 111 108  CO2 17* 21* 20* 20* 22  GLUCOSE 244* 165* 123* 122* 118*  BUN 32* 27* 27* 21 17  CREATININE 1.54* 1.29* 0.99 0.77 0.77  CALCIUM 8.9 8.2* 8.1* 8.1* 7.7*  MG  --  1.8 1.9  --  1.9    GFR: Estimated Creatinine Clearance: 43.4 mL/min (by C-G formula based on SCr of 0.77 mg/dL).  Liver Function Tests: Recent Labs  Lab 06/19/20 2050 06/20/20 0604 06/21/20 0348 06/22/20 0630  AST 24 37 69* 46*  ALT 13 19 43 34  ALKPHOS 68 57 57 58  BILITOT 0.9 0.8 0.6 0.8  PROT 7.6 6.4* 5.8* 5.5*  ALBUMIN 4.1 3.2* 2.7* 2.5*    CBG: No results for input(s): GLUCAP in the last 168 hours.  Recent Results (from the past 240 hour(s))  Urine culture     Status: Abnormal   Collection Time: 06/19/20  8:40 PM   Specimen: In/Out Cath Urine  Result Value Ref Range Status   Specimen  Description   Final    IN/OUT CATH URINE Performed at [facility], [address] [place], [place] [address]    Special Requests   Final    NONE Performed at [facility], [address] [place], [place] [address]    Culture (A)  Final    70,000 COLONIES/mL GROUP B STREP(S.AGALACTIAE)ISOLATED TESTING AGAINST S. AGALACTIAE NOT ROUTINELY PERFORMED DUE TO PREDICTABILITY OF AMP/PEN/VAN SUSCEPTIBILITY. Performed at [facility], [address] [place], [place] [address]    Report Status 06/21/2020 FINAL  Final  Culture, blood (Routine x 2)     Status: Abnormal   Collection Time: 06/19/20  8:50 PM   Specimen: BLOOD RIGHT FOREARM  Result Value Ref Range  Status   Specimen Description   Final    BLOOD RIGHT FOREARM Performed at [facility], [address] [place], [place] [address]    Special Requests   Final    BOTTLES DRAWN AEROBIC AND ANAEROBIC Blood Culture adequate volume Performed at [facility], [address] [place], [place] [address]    Culture  Setup Time   Final    GRAM POSITIVE COCCI AEROBIC AND ANAEROBIC BOTTLES Gram Stain Report Called to,Read Back By and Verified With: J. HEARN 3/31 @ 0639 BY S. BEARD CRITICAL RESULT CALLED TO, READ BACK BY AND VERIFIED WITH: PharmD G Coffee [id number] bj kj Performed at [facility], [address] [place], [place] [address]    Culture STREPTOCOCCUS GROUP G (A)  Final   Report Status 06/22/2020 FINAL  Final   Organism ID, Bacteria STREPTOCOCCUS GROUP G  Final      Susceptibility   Streptococcus group g - MIC*    CLINDAMYCIN <=0.25 SENSITIVE Sensitive     ERYTHROMYCIN <=0.12 SENSITIVE Sensitive     VANCOMYCIN <=0.12 SENSITIVE Sensitive     CEFTRIAXONE <=0.12 SENSITIVE Sensitive     LEVOFLOXACIN 0.5 SENSITIVE Sensitive     PENICILLIN Value in next row Sensitive      SENSITIVE0.06    * STREPTOCOCCUS GROUP G  Blood Culture ID Panel (Reflexed)     Status: Abnormal   Collection Time: 06/19/20  8:50 PM  Result Value  Ref Range Status   Enterococcus faecalis NOT DETECTED NOT DETECTED Final   Enterococcus Faecium NOT DETECTED NOT DETECTED Final   Listeria monocytogenes NOT DETECTED NOT DETECTED Final   Staphylococcus species NOT DETECTED NOT DETECTED Final   Staphylococcus aureus (BCID) NOT DETECTED NOT DETECTED Final   Staphylococcus epidermidis NOT DETECTED NOT DETECTED Final   Staphylococcus lugdunensis NOT DETECTED NOT DETECTED Final   Streptococcus species DETECTED (A) NOT DETECTED Final    Comment: Not Enterococcus species, Streptococcus agalactiae, Streptococcus pyogenes, or Streptococcus pneumoniae. CRITICAL RESULT CALLED TO, READ BACK BY AND VERIFIED WITH: PharmD Maia Breslow  [phone] by kj    Streptococcus agalactiae NOT DETECTED NOT DETECTED Final   Streptococcus pneumoniae NOT DETECTED NOT DETECTED Final   Streptococcus pyogenes NOT DETECTED NOT DETECTED Final   A.calcoaceticus-baumannii NOT DETECTED NOT DETECTED Final   Bacteroides fragilis NOT DETECTED NOT DETECTED Final   Enterobacterales NOT DETECTED NOT DETECTED Final   Enterobacter cloacae complex NOT DETECTED NOT DETECTED Final   Escherichia coli NOT DETECTED NOT DETECTED Final   Klebsiella aerogenes NOT DETECTED NOT DETECTED Final   Klebsiella oxytoca NOT DETECTED NOT DETECTED Final   Klebsiella pneumoniae NOT DETECTED NOT DETECTED Final   Proteus species NOT DETECTED NOT DETECTED Final   Salmonella species NOT DETECTED NOT DETECTED Final   Serratia marcescens NOT DETECTED NOT DETECTED Final   Haemophilus influenzae NOT DETECTED NOT DETECTED Final   Neisseria meningitidis NOT DETECTED NOT DETECTED Final   Pseudomonas aeruginosa NOT DETECTED NOT DETECTED Final   Stenotrophomonas maltophilia NOT DETECTED NOT DETECTED Final   Candida albicans NOT DETECTED NOT DETECTED Final   Candida auris NOT DETECTED NOT DETECTED Final   Candida glabrata NOT DETECTED NOT DETECTED Final   Candida krusei NOT DETECTED NOT DETECTED Final   Candida  parapsilosis NOT DETECTED NOT DETECTED Final   Candida tropicalis NOT DETECTED NOT DETECTED Final   Cryptococcus neoformans/gattii NOT DETECTED NOT DETECTED Final    Comment: Performed at [facility], [address], [place], [place]  [address]  Resp Panel by RT-PCR (Flu A&B, Covid) Urine, Catheterized     Status: None   Collection Time: 06/19/20  8:55 PM   Specimen: Urine, Catheterized; Nasopharyngeal(NP) swabs in vial transport medium  Result Value Ref Range Status   SARS Coronavirus 2 by RT PCR NEGATIVE NEGATIVE Final    Comment: (NOTE) SARS-CoV-2 target nucleic acids are NOT DETECTED.  The SARS-CoV-2 RNA is generally detectable in upper respiratory specimens during the acute phase of infection. The lowest concentration of SARS-CoV-2 viral copies this assay can detect is 138 copies/mL. A negative result does not preclude SARS-Cov-2 infection and should not be used as the sole basis for treatment or other patient management decisions. A negative result may occur with  improper specimen collection/handling, submission of specimen other than nasopharyngeal swab, presence of viral mutation(s) within the areas targeted by this assay, and inadequate number of viral copies(<138 copies/mL). A negative result must be combined with clinical observations, patient history, and epidemiological information. The expected result is Negative.  Fact Sheet for Patients:  BloggerCourse.com  Fact Sheet for Healthcare Providers:  SeriousBroker.it  This test is no t yet approved or cleared by the [place] FDA and  has been authorized for detection and/or diagnosis of SARS-CoV-2 by FDA under an Emergency Use Authorization (EUA). This EUA will remain  in effect (meaning this test can be used) for the duration of the COVID-19 declaration under Section 564(b)(1) of the Act, 21 U.S.C.section 360bbb-3(b)(1), unless the authorization is terminated   or revoked sooner.       Influenza A by PCR NEGATIVE NEGATIVE Final   Influenza B by PCR NEGATIVE NEGATIVE Final    Comment: (NOTE) The Xpert Xpress SARS-CoV-2/FLU/RSV plus assay is intended as an aid in the diagnosis of influenza from Nasopharyngeal swab specimens and should not be used as a sole basis for treatment. Nasal washings and aspirates are unacceptable for Xpert Xpress SARS-CoV-2/FLU/RSV testing.  Fact Sheet for Patients: BloggerCourse.com  Fact Sheet for Healthcare Providers: SeriousBroker.it  This test is not yet approved or cleared by the [place] FDA and has been authorized for detection and/or diagnosis of SARS-CoV-2 by FDA under an Emergency Use Authorization (EUA). This EUA will remain in effect (meaning this test can be used) for the duration of the COVID-19 declaration under Section 564(b)(1) of the Act, 21 U.S.C. section 360bbb-3(b)(1), unless the authorization is terminated or revoked.  Performed at [facility], [address] [place], [place] [address]   Culture, blood (Routine x 2)     Status: None (Preliminary result)   Collection Time: 06/19/20  9:36 PM   Specimen: Site Not Specified; Blood  Result Value Ref Range Status   Specimen Description SITE NOT SPECIFIED  Final   Special Requests   Final    BOTTLES DRAWN AEROBIC AND ANAEROBIC Blood Culture adequate volume   Culture   Final    NO GROWTH 3 DAYS Performed at [facility], [address] [place], [place] [address]    Report Status PENDING  Incomplete  MRSA PCR Screening     Status: None   Collection Time: 06/20/20  4:10 AM   Specimen: Nasal Mucosa; Nasopharyngeal  Result Value Ref Range Status   MRSA by PCR NEGATIVE NEGATIVE Final    Comment:        The GeneXpert MRSA Assay (FDA approved for NASAL specimens only), is one component of a comprehensive MRSA colonization surveillance program. It is not intended to diagnose  MRSA infection nor to  guide or monitor treatment for MRSA infections. Performed at [facility], [address], [place], [place] [address]   Urine culture     Status: None   Collection Time: 06/20/20  4:30 PM   Specimen: Urine, Catheterized  Result Value Ref Range Status   Specimen Description   Final    URINE, CATHETERIZED Performed at [facility], [address] [place], [place] [address]    Special Requests   Final    NONE Performed at [facility], [address], [place], [place] [address]    Culture   Final    NO GROWTH Performed at [facility], [address], [place], [place] [address]    Report Status 06/22/2020 FINAL  Final    Radiology Studies: No results found. Scheduled Meds: . amLODipine  10 mg Oral Daily  . aspirin EC  81 mg Oral Daily  . Chlorhexidine Gluconate Cloth  6 each Topical Daily  . citalopram  40 mg Oral Daily  . clopidogrel  75 mg Oral Daily  . heparin  5,000 Units Subcutaneous Q8H  . mouth rinse  15 mL Mouth Rinse BID  . memantine  10 mg Oral BID  . polyethylene glycol  17 g Oral Daily  . rosuvastatin  10 mg Oral QPM   Continuous Infusions: . sodium chloride Stopped (06/22/20 1756)  . cefTRIAXone (ROCEPHIN)  IV Stopped (06/22/20 1826)     LOS: 3 days   Time spent: 35 mins   Marl Seago Laural Benes, MD How to contact the [facility] Attending or Consulting provider 7A - 7P or covering provider during after hours 7P -7A, for this patient?  1. Check the care team in [facility] and look for a) attending/consulting TR[facility] provider listed and b) the [facility] team listed 2. Log into [email or web address] and use [facility] universal password to access. If you do not have the password, please contact the hospital operator. 3. Locate the [facility] provider you are looking for under [facility] and page to a number that you can be directly reached. 4. If you still have difficulty reaching the provider, please page the [facility] (Director on Call) for the Hospitalists listed on  amion for assistance.  06/23/2020, 11:41 AM

## 2020-06-24 ENCOUNTER — Inpatient Hospital Stay
Admission: RE | Admit: 2020-06-24 | Discharge: 2020-07-25 | Disposition: A | Discharge status: Home / Self Care | Payer: Medicare Other | Source: Ambulatory Visit | Attending: Internal Medicine | Admitting: Internal Medicine

## 2020-06-24 DIAGNOSIS — F0150 Vascular dementia without behavioral disturbance: Secondary | ICD-10-CM | POA: Diagnosis not present

## 2020-06-24 DIAGNOSIS — N179 Acute kidney failure, unspecified: Secondary | ICD-10-CM | POA: Diagnosis not present

## 2020-06-24 DIAGNOSIS — F0390 Unspecified dementia without behavioral disturbance: Secondary | ICD-10-CM | POA: Diagnosis not present

## 2020-06-24 DIAGNOSIS — B951 Streptococcus, group B, as the cause of diseases classified elsewhere: Secondary | ICD-10-CM | POA: Diagnosis not present

## 2020-06-24 DIAGNOSIS — M7989 Other specified soft tissue disorders: Secondary | ICD-10-CM | POA: Diagnosis not present

## 2020-06-24 DIAGNOSIS — R2681 Unsteadiness on feet: Secondary | ICD-10-CM | POA: Diagnosis not present

## 2020-06-24 DIAGNOSIS — R262 Difficulty in walking, not elsewhere classified: Secondary | ICD-10-CM | POA: Diagnosis not present

## 2020-06-24 DIAGNOSIS — G3184 Mild cognitive impairment, so stated: Secondary | ICD-10-CM | POA: Diagnosis not present

## 2020-06-24 DIAGNOSIS — Z951 Presence of aortocoronary bypass graft: Secondary | ICD-10-CM | POA: Diagnosis not present

## 2020-06-24 DIAGNOSIS — N1831 Chronic kidney disease, stage 3a: Secondary | ICD-10-CM | POA: Diagnosis not present

## 2020-06-24 DIAGNOSIS — N390 Urinary tract infection, site not specified: Secondary | ICD-10-CM | POA: Diagnosis not present

## 2020-06-24 DIAGNOSIS — I25118 Atherosclerotic heart disease of native coronary artery with other forms of angina pectoris: Secondary | ICD-10-CM | POA: Diagnosis not present

## 2020-06-24 DIAGNOSIS — F339 Major depressive disorder, recurrent, unspecified: Secondary | ICD-10-CM | POA: Diagnosis not present

## 2020-06-24 DIAGNOSIS — M79641 Pain in right hand: Secondary | ICD-10-CM | POA: Diagnosis not present

## 2020-06-24 DIAGNOSIS — M6281 Muscle weakness (generalized): Secondary | ICD-10-CM | POA: Diagnosis not present

## 2020-06-24 DIAGNOSIS — I10 Essential (primary) hypertension: Secondary | ICD-10-CM | POA: Diagnosis not present

## 2020-06-24 DIAGNOSIS — K449 Diaphragmatic hernia without obstruction or gangrene: Secondary | ICD-10-CM | POA: Diagnosis not present

## 2020-06-24 DIAGNOSIS — M19041 Primary osteoarthritis, right hand: Secondary | ICD-10-CM | POA: Diagnosis not present

## 2020-06-24 DIAGNOSIS — A409 Streptococcal sepsis, unspecified: Secondary | ICD-10-CM

## 2020-06-24 DIAGNOSIS — E785 Hyperlipidemia, unspecified: Secondary | ICD-10-CM | POA: Diagnosis not present

## 2020-06-24 DIAGNOSIS — R6520 Severe sepsis without septic shock: Secondary | ICD-10-CM | POA: Diagnosis not present

## 2020-06-24 DIAGNOSIS — E559 Vitamin D deficiency, unspecified: Secondary | ICD-10-CM | POA: Diagnosis not present

## 2020-06-24 DIAGNOSIS — I25119 Atherosclerotic heart disease of native coronary artery with unspecified angina pectoris: Secondary | ICD-10-CM | POA: Diagnosis not present

## 2020-06-24 DIAGNOSIS — M25531 Pain in right wrist: Secondary | ICD-10-CM | POA: Diagnosis not present

## 2020-06-24 DIAGNOSIS — Z960 Presence of urogenital implants: Secondary | ICD-10-CM | POA: Diagnosis not present

## 2020-06-24 DIAGNOSIS — E872 Acidosis: Secondary | ICD-10-CM | POA: Diagnosis not present

## 2020-06-24 DIAGNOSIS — R339 Retention of urine, unspecified: Secondary | ICD-10-CM | POA: Diagnosis not present

## 2020-06-24 DIAGNOSIS — K219 Gastro-esophageal reflux disease without esophagitis: Secondary | ICD-10-CM | POA: Diagnosis not present

## 2020-06-24 DIAGNOSIS — Z515 Encounter for palliative care: Secondary | ICD-10-CM | POA: Diagnosis not present

## 2020-06-24 DIAGNOSIS — N200 Calculus of kidney: Secondary | ICD-10-CM | POA: Diagnosis not present

## 2020-06-24 DIAGNOSIS — A419 Sepsis, unspecified organism: Secondary | ICD-10-CM | POA: Diagnosis not present

## 2020-06-24 DIAGNOSIS — E43 Unspecified severe protein-calorie malnutrition: Secondary | ICD-10-CM | POA: Diagnosis not present

## 2020-06-24 DIAGNOSIS — I2510 Atherosclerotic heart disease of native coronary artery without angina pectoris: Secondary | ICD-10-CM | POA: Diagnosis not present

## 2020-06-24 DIAGNOSIS — R634 Abnormal weight loss: Secondary | ICD-10-CM | POA: Diagnosis not present

## 2020-06-24 DIAGNOSIS — R2231 Localized swelling, mass and lump, right upper limb: Secondary | ICD-10-CM | POA: Diagnosis not present

## 2020-06-24 DIAGNOSIS — A498 Other bacterial infections of unspecified site: Secondary | ICD-10-CM | POA: Diagnosis not present

## 2020-06-24 DIAGNOSIS — E876 Hypokalemia: Secondary | ICD-10-CM | POA: Diagnosis not present

## 2020-06-24 DIAGNOSIS — M79621 Pain in right upper arm: Secondary | ICD-10-CM | POA: Diagnosis not present

## 2020-06-24 DIAGNOSIS — G9341 Metabolic encephalopathy: Secondary | ICD-10-CM | POA: Diagnosis not present

## 2020-06-24 DIAGNOSIS — I700 Atherosclerosis of aorta: Secondary | ICD-10-CM | POA: Diagnosis not present

## 2020-06-24 DIAGNOSIS — R600 Localized edema: Secondary | ICD-10-CM | POA: Diagnosis not present

## 2020-06-24 DIAGNOSIS — R1312 Dysphagia, oropharyngeal phase: Secondary | ICD-10-CM | POA: Diagnosis not present

## 2020-06-24 LAB — CULTURE, BLOOD (ROUTINE X 2)
Culture: NO GROWTH
Special Requests: ADEQUATE

## 2020-06-24 MED ORDER — POLYETHYLENE GLYCOL 3350 17 G PO PACK: 17.0000 g | PACK | Freq: Every day | ORAL | 0 refills | Status: AC | PRN

## 2020-06-24 MED ORDER — ISOSORBIDE MONONITRATE ER 30 MG PO TB24: 15.0000 mg | ORAL_TABLET | Freq: Every day | ORAL | 0 refills | Status: DC

## 2020-06-24 MED ORDER — ROSUVASTATIN CALCIUM 10 MG PO TABS: 10.0000 mg | ORAL_TABLET | Freq: Every day | ORAL | 0 refills | Status: DC

## 2020-06-24 MED ORDER — SODIUM CHLORIDE 0.9 % IV BOLUS
250.0000 mL | Freq: Once | INTRAVENOUS | Status: AC
  Administered 2020-06-24: 250 mL via INTRAVENOUS

## 2020-06-24 MED ORDER — CITALOPRAM HYDROBROMIDE 20 MG PO TABS: 20.0000 mg | ORAL_TABLET | Freq: Every day | ORAL | Status: DC

## 2020-06-24 MED ORDER — CEFDINIR 300 MG PO CAPS: 300.0000 mg | ORAL_CAPSULE | Freq: Two times a day (BID) | ORAL | 0 refills | Status: AC

## 2020-06-24 NOTE — Discharge Summary (Addendum)
Physician Discharge Summary  Gloria Rowland:[id number] DOB: [birth year] DOA: 06/19/2020  PCP: Ignatius Specking, MD  Admit date: 06/19/2020 Discharge date: 06/24/2020  Admitted From:  Home  Disposition:  SNF   Recommendations for Outpatient Follow-up:  1. Follow up with PCP in 2-3 weeks 2. Try removing foley in 1 week and do voiding trial  Discharge Condition: STABLE   CODE STATUS: DNR  DIET:   Dysphagia 3, thin liquids, heart healthy  Brief Hospitalization Summary: Please see all hospital notes, images, labs for full details of the hospitalization. ADMISSION HPI:  87 y.o.female,with history of spasmodic torticollis, nephrolithiasis, hypertension, hyperlipidemia, history of hepatitis, dementia, coronary artery disease, and more presents to the ED with a chief complaint of altered mental status.Unfortunately patient is nonverbal at this time and any history, report and chart review patient is normallyable to hold a clear conversation. She does have signs of dementia with forgetfulness, but is usually alert and oriented x3. Today patient was confused, and had decreased urine output and so they called EMS. During her time in the ED she did have an episode of vomiting. Patient also had a fever in the ED temperature of 102.5, for which she was given Tylenol. She was tachycardic up to 118, tachypneic up to 24, had soft pressures as low as 95/51, and a white blood cell count of 19.1. Her initial lactic acid was 5.8, and improved to 2.3after1.750 L of lactated Ringer's. Patient was initially started on broad-spectrum antibiotics with cefepime, Vanco, Flagyl. Her UA then came back very consistent with UTI. Urine culture was ordered. Blood cultures were also ordered. Head CT was done that showed no evidence for acute intracranial abnormality. Chest x-ray was done that showed no active disease. Chemistry panel revealed a hyperglycemia at 244, and an AKI. Baseline creatinine is 0.9 and today  was 1.54. Patient also had a leukocytosis at 19.1. Covid and flu were negative. Admission was requested. Further work-up and treatment of sepsis. Patient is admitted to stepdown due to her softer pressures.    Hospital Course:    1. Group G Strep Sepsis - sepsis physiology is resolved now after supportive care.   2. GBS UTI - Afebrile, antibiotics de-escalated to ceftriaxone 2gm IV every 24 hours.  Pt responded well.  Plan to DC on cefdinir 300 mg BID x 5 additional days to complete course.    3. Acute metabolic encephalopathy - RESOLVED.  4. Lactic acidosis - resolved after IV fluids.  5. Hypokalemia - IV replacement ordered and repleted.  6. AKI - RESOLVED.   7. Prolonged QTc - Resolved according to morning EKG 4/3.  8. DNR present on admission - daughter confirmed.   DVT prophylaxis:  SCDs Code Status: DNR  Family Communication: daughter updated telephone 4/2, 4/3  Disposition: SNF placement Status is: Inpatient  Discharge Diagnoses:  Active Problems:   Palliative care by specialist   Sepsis (HCC)   Lactic acidosis   Acute lower UTI   AKI (acute kidney injury) (HCC)   Acute metabolic encephalopathy   Discharge Instructions:  Allergies as of 06/24/2020      Reactions   Aspirin Nausea And Vomiting   Upset stomach   Codeine Nausea And Vomiting   Upset stomach      Medication List    STOP taking these medications   lisinopril 20 MG tablet Commonly known as: ZESTRIL   polyethylene glycol powder 17 GM/SCOOP powder Commonly known as: GLYCOLAX/MIRALAX Replaced by: polyethylene glycol 17 g packet  simethicone 80 MG chewable tablet Commonly known as: MYLICON     TAKE these medications   acetaminophen 650 MG CR tablet Commonly known as: TYLENOL Take 650 mg by mouth every 6 (six) hours as needed for pain.   amLODipine 10 MG tablet Commonly known as: NORVASC Take 1 tablet (10 mg total) by mouth daily.   aspirin EC 81 MG tablet Take 81 mg by mouth daily.    cefdinir 300 MG capsule Commonly known as: OMNICEF Take 1 capsule (300 mg total) by mouth 2 (two) times daily for 5 days.   citalopram 20 MG tablet Commonly known as: CELEXA Take 1 tablet (20 mg total) by mouth daily. What changed:   medication strength  how much to take   clopidogrel 75 MG tablet Commonly known as: PLAVIX Take 1 tablet (75 mg total) by mouth daily.   isosorbide mononitrate 30 MG 24 hr tablet Commonly known as: IMDUR Take 0.5 tablets (15 mg total) by mouth daily. What changed: how much to take   memantine 10 MG tablet Commonly known as: NAMENDA Take 10 mg by mouth 2 (two) times daily.   nitroGLYCERIN 0.4 MG SL tablet Commonly known as: NITROSTAT Place 1 tablet (0.4 mg total) under the tongue every 5 (five) minutes x 3 doses as needed for up to 10 days for chest pain.   ondansetron 4 MG disintegrating tablet Commonly known as: Zofran ODT Take 1 tablet (4 mg total) by mouth every 8 (eight) hours as needed for nausea or vomiting.   pantoprazole 40 MG tablet Commonly known as: PROTONIX Take 1 tablet (40 mg total) by mouth daily.   polyethylene glycol 17 g packet Commonly known as: MIRALAX / GLYCOLAX Take 17 g by mouth daily as needed for mild constipation. Replaces: polyethylene glycol powder 17 GM/SCOOP powder   rosuvastatin 10 MG tablet Commonly known as: CRESTOR Take 1 tablet (10 mg total) by mouth daily. What changed:   medication strength  how much to take   Vitamin D (Ergocalciferol) 1.25 MG (50000 UNIT) Caps capsule Commonly known as: DRISDOL Take 50,000 Units by mouth every 7 (seven) days.       Follow-up Information    Vyas, Dhruv B, MD. Schedule an appointment as soon as possible for a visit in 2 week(s).   Specialty: Internal Medicine Contact information: [address] [place] [address] [phone]        Chilton Si, MD. Schedule an appointment as soon as possible for a visit in 3 week(s).   Specialty:  Cardiology Contact information: [address] [place] [address] [phone]              Allergies  Allergen Reactions  . Aspirin Nausea And Vomiting    Upset stomach  . Codeine Nausea And Vomiting    Upset stomach   Allergies as of 06/24/2020      Reactions   Aspirin Nausea And Vomiting   Upset stomach   Codeine Nausea And Vomiting   Upset stomach      Medication List    STOP taking these medications   lisinopril 20 MG tablet Commonly known as: ZESTRIL   polyethylene glycol powder 17 GM/SCOOP powder Commonly known as: GLYCOLAX/MIRALAX Replaced by: polyethylene glycol 17 g packet   simethicone 80 MG chewable tablet Commonly known as: MYLICON     TAKE these medications   acetaminophen 650 MG CR tablet Commonly known as: TYLENOL Take 650 mg by mouth every 6 (six) hours as  needed for pain.   amLODipine 10 MG tablet Commonly known as: NORVASC Take 1 tablet (10 mg total) by mouth daily.   aspirin EC 81 MG tablet Take 81 mg by mouth daily.   cefdinir 300 MG capsule Commonly known as: OMNICEF Take 1 capsule (300 mg total) by mouth 2 (two) times daily for 5 days.   citalopram 20 MG tablet Commonly known as: CELEXA Take 1 tablet (20 mg total) by mouth daily. What changed:   medication strength  how much to take   clopidogrel 75 MG tablet Commonly known as: PLAVIX Take 1 tablet (75 mg total) by mouth daily.   isosorbide mononitrate 30 MG 24 hr tablet Commonly known as: IMDUR Take 0.5 tablets (15 mg total) by mouth daily. What changed: how much to take   memantine 10 MG tablet Commonly known as: NAMENDA Take 10 mg by mouth 2 (two) times daily.   nitroGLYCERIN 0.4 MG SL tablet Commonly known as: NITROSTAT Place 1 tablet (0.4 mg total) under the tongue every 5 (five) minutes x 3 doses as needed for up to 10 days for chest pain.   ondansetron 4 MG disintegrating tablet Commonly known as: Zofran ODT Take 1 tablet (4 mg total) by  mouth every 8 (eight) hours as needed for nausea or vomiting.   pantoprazole 40 MG tablet Commonly known as: PROTONIX Take 1 tablet (40 mg total) by mouth daily.   polyethylene glycol 17 g packet Commonly known as: MIRALAX / GLYCOLAX Take 17 g by mouth daily as needed for mild constipation. Replaces: polyethylene glycol powder 17 GM/SCOOP powder   rosuvastatin 10 MG tablet Commonly known as: CRESTOR Take 1 tablet (10 mg total) by mouth daily. What changed:   medication strength  how much to take   Vitamin D (Ergocalciferol) 1.25 MG (50000 UNIT) Caps capsule Commonly known as: DRISDOL Take 50,000 Units by mouth every 7 (seven) days.       Procedures/Studies: CT Head Wo Contrast  Result Date: 06/19/2020 CLINICAL DATA:  Mental status change fever EXAM: CT HEAD WITHOUT CONTRAST TECHNIQUE: Contiguous axial images were obtained from the base of the skull through the vertex without intravenous contrast. COMPARISON:  CT brain 05/01/2017 FINDINGS: Brain: No acute territorial infarction, hemorrhage or intracranial mass. Atrophy and mild chronic small vessel ischemic changes of the white matter. Stable ventricle size. Vascular: No hyperdense vessels.  Carotid vascular calcification. Skull: Normal. Negative for fracture or focal lesion. Sinuses/Orbits: No acute finding. Other: None IMPRESSION: 1. No CT evidence for acute intracranial abnormality. 2. Atrophy and mild chronic small vessel ischemic changes of the white matter. Electronically Signed   By: Jasmine Pang M.D.   On: 06/19/2020 22:03   DG Chest Port 1 View  Result Date: 06/19/2020 CLINICAL DATA:  Fever EXAM: PORTABLE CHEST 1 VIEW COMPARISON:  05/12/2020 FINDINGS: Post sternotomy changes. No focal opacity or pleural effusion. Aortic atherosclerosis. Normal cardiac size. Scoliosis of the spine. IMPRESSION: No active disease. Electronically Signed   By: Jasmine Pang M.D.   On: 06/19/2020 21:11      Subjective: Pt is eating and  drinking a lot better, no specific complaints.  Pt agreeable to acute SNF rehab placement.   Discharge Exam: Vitals:   06/23/20 2057 06/24/20 0425  BP: 134/65 120/75  Pulse: 63 (!) 55  Resp: 16 16  Temp: 97.6 F (36.4 C) 98 F (36.7 C)  SpO2: 97% 100%   Vitals:   06/23/20 0736 06/23/20 1344 06/23/20 2057 06/24/20 0425  BP:  126/69 (!) 137/58 134/65 120/75  Pulse: (!) 59 62 63 (!) 55  Resp: Temp: 98.1 F (36.7 C) 98.6 F (37 C) 97.6 F (36.4 C) 98 F (36.7 C)  TempSrc: Axillary Oral    SpO2: 98% 96% 97% 100%  Weight:      Height:       General exam: frail, elderly female, appears chronically ill, Appears calm and comfortable. Pt is awake and alert.  Respiratory system: Clear to auscultation. Respiratory effort normal. Cardiovascular system: normal S1 & S2 heard. No JVD, murmurs, rubs, gallops or clicks. No pedal edema. Gastrointestinal system: Abdomen is nondistended, soft and nontender. No organomegaly or masses felt. Normal bowel sounds heard. Central nervous system: Alert and oriented. No focal neurological deficits. Extremities: Symmetric 5 x 5 power. Skin: No rashes, lesions or ulcers Psychiatry: Judgement and insight stable. Mood & affect appropriate.    The results of significant diagnostics from this hospitalization (including imaging, microbiology, ancillary and laboratory) are listed below for reference.     Microbiology: Recent Results (from the past 240 hour(s))  Urine culture     Status: Abnormal   Collection Time: 06/19/20  8:40 PM   Specimen: In/Out Cath Urine  Result Value Ref Range Status   Specimen Description   Final    IN/OUT CATH URINE Performed at [facility], [address] [place], [place] [address]    Special Requests   Final    NONE Performed at [facility], [address] [place], [place] [address]    Culture (A)  Final    70,000 COLONIES/mL GROUP B STREP(S.AGALACTIAE)ISOLATED TESTING AGAINST S. AGALACTIAE NOT ROUTINELY  PERFORMED DUE TO PREDICTABILITY OF AMP/PEN/VAN SUSCEPTIBILITY. Performed at [facility], [address] [place], [place] [address]    Report Status 06/21/2020 FINAL  Final  Culture, blood (Routine x 2)     Status: Abnormal   Collection Time: 06/19/20  8:50 PM   Specimen: BLOOD RIGHT FOREARM  Result Value Ref Range Status   Specimen Description   Final    BLOOD RIGHT FOREARM Performed at [facility], [address], [place], [place] [address]    Special Requests   Final    BOTTLES DRAWN AEROBIC AND ANAEROBIC Blood Culture adequate volume Performed at [facility], [address], [place], [place] [address]    Culture  Setup Time   Final    GRAM POSITIVE COCCI AEROBIC AND ANAEROBIC BOTTLES Gram Stain Report Called to,Read Back By and Verified With: J. HEARN 3/31 @ 0639 BY S. BEARD CRITICAL RESULT CALLED TO, READ BACK BY AND VERIFIED WITH: PharmD G Coffee [id number] bj kj Performed at [facility], [address], [place], [place] [address]    Culture STREPTOCOCCUS GROUP G (A)  Final   Report Status 06/22/2020 FINAL  Final   Organism ID, Bacteria STREPTOCOCCUS GROUP G  Final      Susceptibility   Streptococcus group g - MIC*    CLINDAMYCIN <=0.25 SENSITIVE Sensitive     ERYTHROMYCIN <=0.12 SENSITIVE Sensitive     VANCOMYCIN <=0.12 SENSITIVE Sensitive     CEFTRIAXONE <=0.12 SENSITIVE Sensitive     LEVOFLOXACIN 0.5 SENSITIVE Sensitive     PENICILLIN Value in next row Sensitive      SENSITIVE0.06    * STREPTOCOCCUS GROUP G  Blood Culture ID Panel (Reflexed)     Status: Abnormal   Collection Time: 06/19/20  8:50 PM  Result Value Ref Range Status  Enterococcus faecalis NOT DETECTED NOT DETECTED Final   Enterococcus Faecium NOT DETECTED NOT DETECTED Final   Listeria monocytogenes NOT DETECTED NOT DETECTED Final   Staphylococcus species NOT DETECTED NOT DETECTED Final   Staphylococcus aureus (BCID) NOT DETECTED NOT DETECTED Final   Staphylococcus epidermidis NOT  DETECTED NOT DETECTED Final   Staphylococcus lugdunensis NOT DETECTED NOT DETECTED Final   Streptococcus species DETECTED (A) NOT DETECTED Final    Comment: Not Enterococcus species, Streptococcus agalactiae, Streptococcus pyogenes, or Streptococcus pneumoniae. CRITICAL RESULT CALLED TO, READ BACK BY AND VERIFIED WITH: PharmD Maia BreslowGCoffee  [phone] [phone] by kj    Streptococcus agalactiae NOT DETECTED NOT DETECTED Final   Streptococcus pneumoniae NOT DETECTED NOT DETECTED Final   Streptococcus pyogenes NOT DETECTED NOT DETECTED Final   A.calcoaceticus-baumannii NOT DETECTED NOT DETECTED Final   Bacteroides fragilis NOT DETECTED NOT DETECTED Final   Enterobacterales NOT DETECTED NOT DETECTED Final   Enterobacter cloacae complex NOT DETECTED NOT DETECTED Final   Escherichia coli NOT DETECTED NOT DETECTED Final   Klebsiella aerogenes NOT DETECTED NOT DETECTED Final   Klebsiella oxytoca NOT DETECTED NOT DETECTED Final   Klebsiella pneumoniae NOT DETECTED NOT DETECTED Final   Proteus species NOT DETECTED NOT DETECTED Final   Salmonella species NOT DETECTED NOT DETECTED Final   Serratia marcescens NOT DETECTED NOT DETECTED Final   Haemophilus influenzae NOT DETECTED NOT DETECTED Final   Neisseria meningitidis NOT DETECTED NOT DETECTED Final   Pseudomonas aeruginosa NOT DETECTED NOT DETECTED Final   Stenotrophomonas maltophilia NOT DETECTED NOT DETECTED Final   Candida albicans NOT DETECTED NOT DETECTED Final   Candida auris NOT DETECTED NOT DETECTED Final   Candida glabrata NOT DETECTED NOT DETECTED Final   Candida krusei NOT DETECTED NOT DETECTED Final   Candida parapsilosis NOT DETECTED NOT DETECTED Final   Candida tropicalis NOT DETECTED NOT DETECTED Final   Cryptococcus neoformans/gattii NOT DETECTED NOT DETECTED Final    Comment: Performed at [facility], [address] [place], [place] [id number]  Resp Panel by RT-PCR (Flu A&B, Covid) Urine, Catheterized     Status: None    Collection Time: 06/19/20  8:55 PM   Specimen: Urine, Catheterized; Nasopharyngeal(NP) swabs in vial transport medium  Result Value Ref Range Status   SARS Coronavirus 2 by RT PCR NEGATIVE NEGATIVE Final    Comment: (NOTE) SARS-CoV-2 target nucleic acids are NOT DETECTED.  The SARS-CoV-2 RNA is generally detectable in upper respiratory specimens during the acute phase of infection. The lowest concentration of SARS-CoV-2 viral copies this assay can detect is 138 copies/mL. A negative result does not preclude SARS-Cov-2 infection and should not be used as the sole basis for treatment or other patient management decisions. A negative result may occur with  improper specimen collection/handling, submission of specimen other than nasopharyngeal swab, presence of viral mutation(s) within the areas targeted by this assay, and inadequate number of viral copies(<138 copies/mL). A negative result must be combined with clinical observations, patient history, and epidemiological information. The expected result is Negative.  Fact Sheet for Patients:  BloggerCourse.comhttps://www.fda.gov/media/152166/download  Fact Sheet for Healthcare Providers:  SeriousBroker.ithttps://www.fda.gov/media/152162/download  This test is no t yet approved or cleared by the [place] FDA and  has been authorized for detection and/or diagnosis of SARS-CoV-2 by FDA under an Emergency Use Authorization (EUA). This EUA will remain  in effect (meaning this test can be used) for the duration of the COVID-19 declaration under Section 564(b)(1) of the Act, 21 U.S.C.section 360bbb-3(b)(1), unless the authorization  is terminated  or revoked sooner.       Influenza A by PCR NEGATIVE NEGATIVE Final   Influenza B by PCR NEGATIVE NEGATIVE Final    Comment: (NOTE) The Xpert Xpress SARS-CoV-2/FLU/RSV plus assay is intended as an aid in the diagnosis of influenza from Nasopharyngeal swab specimens and should not be used as a sole basis for treatment.  Nasal washings and aspirates are unacceptable for Xpert Xpress SARS-CoV-2/FLU/RSV testing.  Fact Sheet for Patients: BloggerCourse.com  Fact Sheet for Healthcare Providers: SeriousBroker.it  This test is not yet approved or cleared by the [place] FDA and has been authorized for detection and/or diagnosis of SARS-CoV-2 by FDA under an Emergency Use Authorization (EUA). This EUA will remain in effect (meaning this test can be used) for the duration of the COVID-19 declaration under Section 564(b)(1) of the Act, 21 U.S.C. section 360bbb-3(b)(1), unless the authorization is terminated or revoked.  Performed at [facility], [address], [place], [place] [address]   Culture, blood (Routine x 2)     Status: None   Collection Time: 06/19/20  9:36 PM   Specimen: Site Not Specified; Blood  Result Value Ref Range Status   Specimen Description SITE NOT SPECIFIED  Final   Special Requests   Final    BOTTLES DRAWN AEROBIC AND ANAEROBIC Blood Culture adequate volume   Culture   Final    NO GROWTH 5 DAYS Performed at [facility], [address], [place], [place] [address]    Report Status 06/24/2020 FINAL  Final  MRSA PCR Screening     Status: None   Collection Time: 06/20/20  4:10 AM   Specimen: Nasal Mucosa; Nasopharyngeal  Result Value Ref Range Status   MRSA by PCR NEGATIVE NEGATIVE Final    Comment:        The GeneXpert MRSA Assay (FDA approved for NASAL specimens only), is one component of a comprehensive MRSA colonization surveillance program. It is not intended to diagnose MRSA infection nor to guide or monitor treatment for MRSA infections. Performed at [facility], [address], [place], [place] [address]   Urine culture     Status: None   Collection Time: 06/20/20  4:30 PM   Specimen: Urine, Catheterized  Result Value Ref Range Status   Specimen Description   Final    URINE, CATHETERIZED Performed at [facility], [address] [place], [place] [address]    Special Requests   Final    NONE Performed at [facility], [address] [place], [place] [address]    Culture   Final    NO GROWTH Performed at [facility], [address], [place], [place] [address]    Report Status 06/22/2020 FINAL  Final     Labs: BNP (last 3 results) No results for input(s): BNP in the last 8760 hours. Basic Metabolic Panel: Recent Labs  Lab 06/19/20 2050 06/20/20 0604 06/21/20 0348 06/22/20 0630 06/23/20 0452  NA 135 138 141 140 138  K 4.2 3.6 3.4* 2.9* 3.5  CL 101 107 113* 111 108  CO2 17* 21* 20* 20* 22  GLUCOSE 244* 165* 123* 122* 118*  BUN 32* 27* 27* 21 17  CREATININE 1.54* 1.29* 0.99 0.77 0.77  CALCIUM 8.9 8.2* 8.1* 8.1* 7.7*  MG  --  1.8 1.9  --  1.9   Liver Function Tests: Recent Labs  Lab 06/19/20 2050 06/20/20 0604 06/21/20 0348 06/22/20 0630  AST 24 37 69* 46*  ALT 13  19 43 34  ALKPHOS 68 57 57 58  BILITOT 0.9 0.8 0.6 0.8  PROT 7.6 6.4* 5.8* 5.5*  ALBUMIN 4.1 3.2* 2.7* 2.5*   No results for input(s): LIPASE, AMYLASE in the last 168 hours. No results for input(s): AMMONIA in the last 168 hours. CBC: Recent Labs  Lab 06/19/20 2050 06/20/20 0604 06/21/20 0348  WBC 19.1* 16.2* 9.3  NEUTROABS 16.4* 14.7* 8.0*  HGB 10.6* 9.5* 9.5*  HCT 33.0* 29.9* 29.6*  MCV 95.9 97.4 97.4  PLT 262 160 141*   Cardiac Enzymes: No results for input(s): CKTOTAL, CKMB, CKMBINDEX, TROPONINI in the last 168 hours. BNP: Invalid input(s): POCBNP CBG: No results for input(s): GLUCAP in the last 168 hours. D-Dimer No results for input(s): DDIMER in the last 72 hours. Hgb A1c No results for input(s): HGBA1C in the last 72 hours. Lipid Profile No results for input(s): CHOL, HDL, LDLCALC, TRIG, CHOLHDL, LDLDIRECT in the last 72 hours. Thyroid function studies No results for input(s): TSH, T4TOTAL, T3FREE, THYROIDAB in the last 72 hours.  Invalid input(s): FREET3 Anemia work  up No results for input(s): VITAMINB12, FOLATE, FERRITIN, TIBC, IRON, RETICCTPCT in the last 72 hours. Urinalysis    Component Value Date/Time   COLORURINE YELLOW 06/19/2020 2040   APPEARANCEUR CLOUDY (A) 06/19/2020 2040   LABSPEC 1.019 06/19/2020 2040   PHURINE 5.0 06/19/2020 2040   GLUCOSEU NEGATIVE 06/19/2020 2040   HGBUR LARGE (A) 06/19/2020 2040   BILIRUBINUR NEGATIVE 06/19/2020 2040   KETONESUR NEGATIVE 06/19/2020 2040   PROTEINUR 100 (A) 06/19/2020 2040   NITRITE NEGATIVE 06/19/2020 2040   LEUKOCYTESUR LARGE (A) 06/19/2020 2040   Sepsis Labs Invalid input(s): PROCALCITONIN,  WBC,  LACTICIDVEN Microbiology Recent Results (from the past 240 hour(s))  Urine culture     Status: Abnormal   Collection Time: 06/19/20  8:40 PM   Specimen: In/Out Cath Urine  Result Value Ref Range Status   Specimen Description   Final    IN/OUT CATH URINE Performed at [facility], [address], [place], [place] [address]    Special Requests   Final    NONE Performed at [facility], [address], [place], [place] [address]    Culture (A)  Final    70,000 COLONIES/mL GROUP B STREP(S.AGALACTIAE)ISOLATED TESTING AGAINST S. AGALACTIAE NOT ROUTINELY PERFORMED DUE TO PREDICTABILITY OF AMP/PEN/VAN SUSCEPTIBILITY. Performed at [facility], [address], [place], [place] [address]    Report Status 06/21/2020 FINAL  Final  Culture, blood (Routine x 2)     Status: Abnormal   Collection Time: 06/19/20  8:50 PM   Specimen: BLOOD RIGHT FOREARM  Result Value Ref Range Status   Specimen Description   Final    BLOOD RIGHT FOREARM Performed at [facility], [address], [place], [place] [address]    Special Requests   Final    BOTTLES DRAWN AEROBIC AND ANAEROBIC Blood Culture adequate volume Performed at [facility], [address], [place], [place] [address]    Culture  Setup Time   Final    GRAM POSITIVE COCCI AEROBIC AND ANAEROBIC BOTTLES Gram Stain Report Called to,Read Back By and  Verified With: J. HEARN 3/31 @ 0639 BY S. BEARD CRITICAL RESULT CALLED TO, READ BACK BY AND VERIFIED WITH: PharmD G Coffee [id number] bj kj Performed at [facility], [address], [place], [place] [address]    Culture STREPTOCOCCUS GROUP G (A)  Final   Report Status 06/22/2020 FINAL  Final  Organism ID, Bacteria STREPTOCOCCUS GROUP G  Final      Susceptibility   Streptococcus group g - MIC*    CLINDAMYCIN <=0.25 SENSITIVE Sensitive     ERYTHROMYCIN <=0.12 SENSITIVE Sensitive     VANCOMYCIN <=0.12 SENSITIVE Sensitive     CEFTRIAXONE <=0.12 SENSITIVE Sensitive     LEVOFLOXACIN 0.5 SENSITIVE Sensitive     PENICILLIN Value in next row Sensitive      SENSITIVE0.06    * STREPTOCOCCUS GROUP G  Blood Culture ID Panel (Reflexed)     Status: Abnormal   Collection Time: 06/19/20  8:50 PM  Result Value Ref Range Status   Enterococcus faecalis NOT DETECTED NOT DETECTED Final   Enterococcus Faecium NOT DETECTED NOT DETECTED Final   Listeria monocytogenes NOT DETECTED NOT DETECTED Final   Staphylococcus species NOT DETECTED NOT DETECTED Final   Staphylococcus aureus (BCID) NOT DETECTED NOT DETECTED Final   Staphylococcus epidermidis NOT DETECTED NOT DETECTED Final   Staphylococcus lugdunensis NOT DETECTED NOT DETECTED Final   Streptococcus species DETECTED (A) NOT DETECTED Final    Comment: Not Enterococcus species, Streptococcus agalactiae, Streptococcus pyogenes, or Streptococcus pneumoniae. CRITICAL RESULT CALLED TO, READ BACK BY AND VERIFIED WITH: PharmD Maia Breslow  [id number] [phone] by kj    Streptococcus agalactiae NOT DETECTED NOT DETECTED Final   Streptococcus pneumoniae NOT DETECTED NOT DETECTED Final   Streptococcus pyogenes NOT DETECTED NOT DETECTED Final   A.calcoaceticus-baumannii NOT DETECTED NOT DETECTED Final   Bacteroides fragilis NOT DETECTED NOT DETECTED Final   Enterobacterales NOT DETECTED NOT DETECTED Final   Enterobacter cloacae complex NOT DETECTED NOT DETECTED  Final   Escherichia coli NOT DETECTED NOT DETECTED Final   Klebsiella aerogenes NOT DETECTED NOT DETECTED Final   Klebsiella oxytoca NOT DETECTED NOT DETECTED Final   Klebsiella pneumoniae NOT DETECTED NOT DETECTED Final   Proteus species NOT DETECTED NOT DETECTED Final   Salmonella species NOT DETECTED NOT DETECTED Final   Serratia marcescens NOT DETECTED NOT DETECTED Final   Haemophilus influenzae NOT DETECTED NOT DETECTED Final   Neisseria meningitidis NOT DETECTED NOT DETECTED Final   Pseudomonas aeruginosa NOT DETECTED NOT DETECTED Final   Stenotrophomonas maltophilia NOT DETECTED NOT DETECTED Final   Candida albicans NOT DETECTED NOT DETECTED Final   Candida auris NOT DETECTED NOT DETECTED Final   Candida glabrata NOT DETECTED NOT DETECTED Final   Candida krusei NOT DETECTED NOT DETECTED Final   Candida parapsilosis NOT DETECTED NOT DETECTED Final   Candida tropicalis NOT DETECTED NOT DETECTED Final   Cryptococcus neoformans/gattii NOT DETECTED NOT DETECTED Final    Comment: Performed at [facility], [address], [place], [place] [address]  Resp Panel by RT-PCR (Flu A&B, Covid) Urine, Catheterized     Status: None   Collection Time: 06/19/20  8:55 PM   Specimen: Urine, Catheterized; Nasopharyngeal(NP) swabs in vial transport medium  Result Value Ref Range Status   SARS Coronavirus 2 by RT PCR NEGATIVE NEGATIVE Final    Comment: (NOTE) SARS-CoV-2 target nucleic acids are NOT DETECTED.  The SARS-CoV-2 RNA is generally detectable in upper respiratory specimens during the acute phase of infection. The lowest concentration of SARS-CoV-2 viral copies this assay can detect is 138 copies/mL. A negative result does not preclude SARS-Cov-2 infection and should not be used as the sole basis for treatment or other patient management decisions. A negative result may occur with  improper specimen collection/handling, submission of specimen other than nasopharyngeal swab, presence  of viral mutation(s) within the areas  targeted by this assay, and inadequate number of viral copies(<138 copies/mL). A negative result must be combined with clinical observations, patient history, and epidemiological information. The expected result is Negative.  Fact Sheet for Patients:  BloggerCourse.com  Fact Sheet for Healthcare Providers:  SeriousBroker.it  This test is no t yet approved or cleared by the [place] FDA and  has been authorized for detection and/or diagnosis of SARS-CoV-2 by FDA under an Emergency Use Authorization (EUA). This EUA will remain  in effect (meaning this test can be used) for the duration of the COVID-19 declaration under Section 564(b)(1) of the Act, 21 U.S.C.section 360bbb-3(b)(1), unless the authorization is terminated  or revoked sooner.       Influenza A by PCR NEGATIVE NEGATIVE Final   Influenza B by PCR NEGATIVE NEGATIVE Final    Comment: (NOTE) The Xpert Xpress SARS-CoV-2/FLU/RSV plus assay is intended as an aid in the diagnosis of influenza from Nasopharyngeal swab specimens and should not be used as a sole basis for treatment. Nasal washings and aspirates are unacceptable for Xpert Xpress SARS-CoV-2/FLU/RSV testing.  Fact Sheet for Patients: BloggerCourse.com  Fact Sheet for Healthcare Providers: SeriousBroker.it  This test is not yet approved or cleared by the [place] FDA and has been authorized for detection and/or diagnosis of SARS-CoV-2 by FDA under an Emergency Use Authorization (EUA). This EUA will remain in effect (meaning this test can be used) for the duration of the COVID-19 declaration under Section 564(b)(1) of the Act, 21 U.S.C. section 360bbb-3(b)(1), unless the authorization is terminated or revoked.  Performed at [facility], [address], [place], [place] [address]   Culture, blood (Routine x 2)      Status: None   Collection Time: 06/19/20  9:36 PM   Specimen: Site Not Specified; Blood  Result Value Ref Range Status   Specimen Description SITE NOT SPECIFIED  Final   Special Requests   Final    BOTTLES DRAWN AEROBIC AND ANAEROBIC Blood Culture adequate volume   Culture   Final    NO GROWTH 5 DAYS Performed at [facility], [address], [place], [place] [address]    Report Status 06/24/2020 FINAL  Final  MRSA PCR Screening     Status: None   Collection Time: 06/20/20  4:10 AM   Specimen: Nasal Mucosa; Nasopharyngeal  Result Value Ref Range Status   MRSA by PCR NEGATIVE NEGATIVE Final    Comment:        The GeneXpert MRSA Assay (FDA approved for NASAL specimens only), is one component of a comprehensive MRSA colonization surveillance program. It is not intended to diagnose MRSA infection nor to guide or monitor treatment for MRSA infections. Performed at [facility], [address], [place], [place] [address]   Urine culture     Status: None   Collection Time: 06/20/20  4:30 PM   Specimen: Urine, Catheterized  Result Value Ref Range Status   Specimen Description   Final    URINE, CATHETERIZED Performed at [facility], [address], [place], [place] [address]    Special Requests   Final    NONE Performed at [facility], [address], [place], [place] [address]    Culture   Final    NO GROWTH Performed at [facility], [address] [place], [place] [address]    Report Status 06/22/2020 FINAL  Final   Time coordinating discharge: 39 mins  SIGNED:  Standley Dakins, MD  [facility] 06/24/2020, 1:15 PM  How to contact the [facility] Attending or Consulting provider 7A - 7P or covering provider during after hours 7P -7A, for this patient?  1. Check the care team in [facility] and look for a) attending/consulting TR[facility] provider listed and b) the [facility] team listed 2. Log into [email or web address] and use [facility] universal password to access. If you do not have the  password, please contact the hospital operator. 3. Locate the [facility] provider you are looking for under [facility] and page to a number that you can be directly reached. 4. If you still have difficulty reaching the provider, please page the [facility] (Director on Call) for the Hospitalists listed on amion for assistance.

## 2020-06-24 NOTE — Progress Notes (Signed)
Patient was retaining urine.  Foley placed prior to discharge to [facility]

## 2020-06-24 NOTE — Progress Notes (Signed)
Nutrition Brief Note  Patient identified on the Malnutrition Screening Tool (MST) Report  Patient is an 87 yo female with hx of dementia, CAD, HTN and nephrolithiasis. Presents with complaint of altered mental status. UTI and acute metabolic encephalopathy.   Weight history is stable as noted below. At risk for weight loss related to her dementia disease progression and acute infection.  Wt Readings from Last 15 Encounters:  06/21/20 54.4 kg  06/04/20 52.8 kg  05/13/20 52.9 kg  04/10/18 51.7 kg  12/07/17 63.5 kg  08/20/17 54.4 kg  05/01/17 54.4 kg  01/15/17 54.9 kg  12/24/16 56.5 kg  05/25/11 65 kg    Body mass index is 19.36 kg/m. Patient meets criteria for normal based on current BMI.   4/1-ST evaluatation-completed. Patient able to feed herself. Current diet order is Dysphagia 3, patient is consuming approximately 50-75% of meals at this time.   Labs and medications reviewed.  BMP Latest Ref Rng & Units 06/23/2020 06/22/2020 06/21/2020  Glucose 70 - 99 mg/dL 782(U) 235(T) 614(E)  BUN 8 - 23 mg/dL 17 21 31(V)  Creatinine 0.44 - 1.00 mg/dL 4.00 8.67 6.19  Sodium 135 - 145 mmol/L 138 140 141  Potassium 3.5 - 5.1 mmol/L 3.5 2.9(L) 3.4(L)  Chloride 98 - 111 mmol/L 108 111 113(H)  CO2 22 - 32 mmol/L 22 20(L) 20(L)  Calcium 8.9 - 10.3 mg/dL 7.7(L) 8.1(L) 8.1(L)    SNF recommended by PT. Discharge planning in process.   No nutrition interventions warranted at this time. If nutrition issues arise, please consult RD.   Royann Shivers MS,RD,CSG,LDN Contact: Loretha Stapler

## 2020-06-24 NOTE — TOC Transition Note (Signed)
Transition of Care [facility]) - CM/SW Discharge Note   Patient Details  Name: Gloria Rowland MRN: [id number] Date of Birth: [birth year]  Transition of Care [facility] CM/SW Contact:  Karn Cassis, LCSW Phone Number: 06/24/2020, 2:11 PM   Clinical Narrative:  Pt d/c today to SNF. LCSW provided bed offers to pt's daughter, Gloria Rowland who accepts [facility] Pt will transfer with [facility] staff. D/C summary sent to facility. RN given number to call report. Repeat COVID test not needed per Lynnea Ferrier at [facility].      Final next level of care: Skilled Nursing Facility Barriers to Discharge: Barriers Resolved   Patient Goals and CMS Choice Patient states their goals for this hospitalization and ongoing recovery are:: get better CMS Medicare.gov Compare Post Acute Care list provided to:: Patient Represenative (must comment) Choice offered to / list presented to : Adult Children  Discharge Placement              Patient chooses bed at: [facility] Patient to be transferred to facility by: [facility] staff Name of family member notified: Gloria Rowland- daughter Patient and family notified of of transfer: 06/24/20  Discharge Plan and Services In-house Referral: Clinical Social Work   Post Acute Care Choice: Resumption of Svcs/PTA Provider                               Social Determinants of Health (SDOH) Interventions     Readmission Risk Interventions Readmission Risk Prevention Plan 06/21/2020 06/21/2020  Transportation Screening - Complete  HRI or Home Care Consult - Complete  Social Work Consult for Recovery Care Planning/Counseling - Complete  Palliative Care Screening Complete Not Applicable  Medication Review Oceanographer) - Complete  Some recent data might be hidden

## 2020-06-24 NOTE — Care Management Important Message (Signed)
Important Message  Patient Details  Name: Gloria Rowland MRN: [id number] Date of Birth: 1932/10/15   Medicare Important Message Given:  Yes     Corey Harold 06/24/2020, 1:53 PM

## 2020-06-24 NOTE — Plan of Care (Signed)
Alert and oriented to self with confusion and forgetfulness. Easy to re-orient. Denies pain or discomfort. Needs assistance with all adls. Turned and repositioned and bath given. Continue plan of care.  Problem: Education: Goal: Knowledge of General Education information will improve Description: Including pain rating scale, medication(s)/side effects and non-pharmacologic comfort measures Outcome: Progressing   Problem: Health Behavior/Discharge Planning: Goal: Ability to manage health-related needs will improve Outcome: Progressing   Problem: Clinical Measurements: Goal: Ability to maintain clinical measurements within normal limits will improve Outcome: Progressing Goal: Will remain free from infection Outcome: Progressing Goal: Diagnostic test results will improve Outcome: Progressing Goal: Respiratory complications will improve Outcome: Progressing Goal: Cardiovascular complication will be avoided Outcome: Progressing   Problem: Activity: Goal: Risk for activity intolerance will decrease Outcome: Progressing   Problem: Nutrition: Goal: Adequate nutrition will be maintained Outcome: Progressing   Problem: Coping: Goal: Level of anxiety will decrease Outcome: Progressing   Problem: Elimination: Goal: Will not experience complications related to bowel motility Outcome: Progressing Goal: Will not experience complications related to urinary retention Outcome: Progressing   Problem: Pain Managment: Goal: General experience of comfort will improve Outcome: Progressing   Problem: Safety: Goal: Ability to remain free from injury will improve Outcome: Progressing   Problem: Skin Integrity: Goal: Risk for impaired skin integrity will decrease Outcome: Progressing

## 2020-06-25 ENCOUNTER — Encounter: Payer: Self-pay | Admitting: Adult Health

## 2020-06-25 ENCOUNTER — Non-Acute Institutional Stay (SKILLED_NURSING_FACILITY): Payer: Medicare Other | Admitting: Adult Health

## 2020-06-25 DIAGNOSIS — A409 Streptococcal sepsis, unspecified: Secondary | ICD-10-CM

## 2020-06-25 DIAGNOSIS — F339 Major depressive disorder, recurrent, unspecified: Secondary | ICD-10-CM

## 2020-06-25 DIAGNOSIS — E559 Vitamin D deficiency, unspecified: Secondary | ICD-10-CM

## 2020-06-25 DIAGNOSIS — I25118 Atherosclerotic heart disease of native coronary artery with other forms of angina pectoris: Secondary | ICD-10-CM

## 2020-06-25 DIAGNOSIS — R6520 Severe sepsis without septic shock: Secondary | ICD-10-CM

## 2020-06-25 DIAGNOSIS — F0390 Unspecified dementia without behavioral disturbance: Secondary | ICD-10-CM

## 2020-06-25 DIAGNOSIS — E785 Hyperlipidemia, unspecified: Secondary | ICD-10-CM

## 2020-06-25 DIAGNOSIS — N179 Acute kidney failure, unspecified: Secondary | ICD-10-CM

## 2020-06-25 DIAGNOSIS — N183 Chronic kidney disease, stage 3 unspecified: Secondary | ICD-10-CM | POA: Insufficient documentation

## 2020-06-25 DIAGNOSIS — R339 Retention of urine, unspecified: Secondary | ICD-10-CM | POA: Diagnosis not present

## 2020-06-25 DIAGNOSIS — E43 Unspecified severe protein-calorie malnutrition: Secondary | ICD-10-CM | POA: Diagnosis not present

## 2020-06-25 DIAGNOSIS — K219 Gastro-esophageal reflux disease without esophagitis: Secondary | ICD-10-CM

## 2020-06-25 DIAGNOSIS — N390 Urinary tract infection, site not specified: Secondary | ICD-10-CM

## 2020-06-25 DIAGNOSIS — N1831 Chronic kidney disease, stage 3a: Secondary | ICD-10-CM

## 2020-06-25 DIAGNOSIS — I10 Essential (primary) hypertension: Secondary | ICD-10-CM

## 2020-06-25 DIAGNOSIS — I700 Atherosclerosis of aorta: Secondary | ICD-10-CM | POA: Diagnosis not present

## 2020-06-25 DIAGNOSIS — D631 Anemia in chronic kidney disease: Secondary | ICD-10-CM

## 2020-06-25 NOTE — Progress Notes (Signed)
Location:  [facility] Room Number: 155 Place of Service:  SNF (31)   CODE STATUS: dnr   Allergies  Allergen Reactions  . Aspirin Nausea And Vomiting    Upset stomach  . Codeine Nausea And Vomiting    Upset stomach    Chief Complaint  Patient presents with  . Hospitalization Follow-up    HPI:  She is a 87 year old woman who has been hospitalized from 06-19-20 through 06-24-20. Her medical history includes: spasmodic torticollis; nephrolithiasis; dementia; cad recent MI. She presented to the ED due to altered mental status. She had decreased urinary output. Upon arrival to the ED she had a fever 102.5; tachycardic; soft blood culture elevated wbc. She was given IV; her urine culture grew out 70,000 strep group B.  She was started on IV ceftriaxone and transitioned to po omnicef for 5 more days. Her renal function improved with IVF. She is here for short term rehab with her goal to return back home with family. She tells me that she slept well last night; denies any pain; states has a good appetite. She will continue to be followed for her chronic illnesses including: Dementia without behavioral disturbance unspecified dementia type: Severe protein calorie malnutrition: Chronic kidney disease stage 3a    Past Medical History:  Diagnosis Date  . Baker's cyst, ruptured   . CAD (coronary artery disease)   . Dementia (HCC)   . History of hepatitis   . Hyperlipidemia   . Hypertension   . Lumbar degenerative disc disease   . Nephrolithiasis   . Spasmodic torticollis     Past Surgical History:  Procedure Laterality Date  . ABDOMINAL HYSTERECTOMY    . CARPAL TUNNEL RELEASE    . CHOLECYSTECTOMY    . CORONARY ARTERY BYPASS GRAFT  1998   LIMA to LAD-Dx  . ESOPHAGOGASTRODUODENOSCOPY N/A 01/15/2017   Procedure: ESOPHAGOGASTRODUODENOSCOPY (EGD);  Surgeon: Malissa Hippo, MD;  Location: [facility];  Service: Endoscopy;  Laterality: N/A;  1:00  . KIDNEY STONE  SURGERY    . LEFT HEART CATHETERIZATION WITH CORONARY/GRAFT ANGIOGRAM N/A 05/26/2011   Procedure: LEFT HEART CATHETERIZATION WITH Isabel Caprice;  Surgeon: Othella Boyer, MD;  Location: [facility] CATH LAB;  Service: Cardiovascular;  Laterality: N/A;  . LUMBAR LAMINECTOMY      Social History   Socioeconomic History  . Marital status: Married    Spouse name: Not on file  . Number of children: Not on file  . Years of education: Not on file  . Highest education level: Not on file  Occupational History  . Not on file  Tobacco Use  . Smoking status: Never Smoker  . Smokeless tobacco: Never Used  Vaping Use  . Vaping Use: Never used  Substance and Sexual Activity  . Alcohol use: No  . Drug use: No  . Sexual activity: Not Currently  Other Topics Concern  . Not on file  Social History Narrative   Divorced, remarried. Lives at home with daughter   Social Determinants of Health   Financial Resource Strain: Not on file  Food Insecurity: Not on file  Transportation Needs: Not on file  Physical Activity: Not on file  Stress: Not on file  Social Connections: Not on file  Intimate Partner Violence: Not on file   Family History  Problem Relation Age of Onset  . Diabetes Sister   . Diabetes Sister       VITAL SIGNS BP 138/64   Pulse Marland Kitchen)  58   Temp 98.1 F (36.7 C)   Resp 20   Ht 5\' 6"  (1.676 m)   Wt 119 lb (54 kg)   SpO2 97%   BMI 19.21 kg/m   Outpatient Encounter Medications as of 06/25/2020  Medication Sig Note  . acetaminophen (TYLENOL) 650 MG CR tablet Take 650 mg by mouth every 6 (six) hours as needed for pain.   08/25/2020 amLODipine (NORVASC) 10 MG tablet Take 1 tablet (10 mg total) by mouth daily.   Marland Kitchen aspirin EC 81 MG tablet Take 81 mg by mouth daily.   . cefdinir (OMNICEF) 300 MG capsule Take 1 capsule (300 mg total) by mouth 2 (two) times daily for 5 days.   . citalopram (CELEXA) 20 MG tablet Take 1 tablet (20 mg total) by mouth daily.   . clopidogrel (PLAVIX) 75 MG  tablet Take 1 tablet (75 mg total) by mouth daily.   . isosorbide mononitrate (IMDUR) 30 MG 24 hr tablet Take 0.5 tablets (15 mg total) by mouth daily.   . memantine (NAMENDA) 10 MG tablet Take 10 mg by mouth 2 (two) times daily.   . nitroGLYCERIN (NITROSTAT) 0.4 MG SL tablet Place 1 tablet (0.4 mg total) under the tongue every 5 (five) minutes x 3 doses as needed for up to 10 days for chest pain.   Marland Kitchen ondansetron (ZOFRAN ODT) 4 MG disintegrating tablet Take 1 tablet (4 mg total) by mouth every 8 (eight) hours as needed for nausea or vomiting.   . pantoprazole (PROTONIX) 40 MG tablet Take 1 tablet (40 mg total) by mouth daily.   . polyethylene glycol (MIRALAX / GLYCOLAX) 17 g packet Take 17 g by mouth daily as needed for mild constipation.   . rosuvastatin (CRESTOR) 10 MG tablet Take 1 tablet (10 mg total) by mouth daily.   . Vitamin D, Ergocalciferol, (DRISDOL) 50000 units CAPS capsule Take 50,000 Units by mouth every 7 (seven) days. 06/20/2020: On Mondays   No facility-administered encounter medications on file as of 06/25/2020.     SIGNIFICANT DIAGNOSTIC EXAMS  TODAY  06-19-20: chest x-ray:  Post sternotomy changes. No focal opacity or pleural effusion. Aortic atherosclerosis. Normal cardiac size. Scoliosis of the spine.  06-19-20: ct of head:  1. No CT evidence for acute intracranial abnormality. 2. Atrophy and mild chronic small vessel ischemic changes of the white matter.  LABS REVIEWED TODAY;  05-16-20: chol 141 LDL 58; trig 99; hdl 63   12-31-1969: wbc 19.1; hgb 10.6; hct 33.0; mcv 95.9 plt 262; glucose 244; bun 32; creat 1.54; k+ 4.2; na++ 135; ca 8.9 GFR 33; liver normal albumin 4.1 urine culture: 70,000 group B strept 06-20-20: urine culture: no growth 06-21-20: wbc 9.3; hgb 9.5; hct 29.6 mcv 97.4 plt 141; glucose 122; bun 21; creat 0.77; k+ 2.9; na++ 140; ca 8.1 GFR>60; liver normal albumin 2.5 06-24-31: glucose 118; bun 17; creat 0.77; k+ 3.5; na++ 138; ca 7.7; GFR>60; mag 1.9   Review  of Systems  Unable to perform ROS: Dementia (unable to fully participate )   Physical Exam Constitutional:      General: She is not in acute distress.    Appearance: She is well-developed. She is not diaphoretic.  Neck:     Thyroid: No thyromegaly.  Cardiovascular:     Rate and Rhythm: Normal rate and regular rhythm.     Pulses: Normal pulses.     Heart sounds: Normal heart sounds.  Pulmonary:     Effort: Pulmonary effort is  normal. No respiratory distress.     Breath sounds: Normal breath sounds.  Abdominal:     General: Bowel sounds are normal. There is no distension.     Palpations: Abdomen is soft.     Tenderness: There is no abdominal tenderness.  Genitourinary:    Comments: Has foley  Musculoskeletal:        General: Normal range of motion.     Cervical back: Neck supple.     Right lower leg: Edema present.     Left lower leg: Edema present.     Comments: Trace bilateral lower extremity edema  Scoliosis   Lymphadenopathy:     Cervical: No cervical adenopathy.  Skin:    General: Skin is warm and dry.  Neurological:     Mental Status: She is alert. Mental status is at baseline.  Psychiatric:        Mood and Affect: Mood normal.      ASSESSMENT/ PLAN:  TODAY  1. Sepsis due to streptococcus species with acute renal failure without septic shock; unspecified renal failure type: acute UTI: is stable will complete omnicef 300 mg twice daily for 5 days will monitor   2. Dementia without behavioral disturbance unspecified dementia type: is without change weight is 119 pounds; will continue namenda 10 mg twice daily   3. Severe protein calorie malnutrition: is without change albumin 2.5 will continue supplements and will begin prostat 30 mL three times daily   4. Chronic kidney disease stage 3a: will monitor bun 17; creat 0.77  5. Urine retention: has foley in one week will remove foley and will monitor   6. Coronary artery disease involving native coronary artery of  native heart with other forms of angina pectoris: is present stable is status post MI in Feb of this year; will continue asa 81 mg daily imdur 15 mg daily plavix 75 mg daily has prn ntg.   7. Major depression recurrent chronic: is stable will continue celexa 20 mg daily  8. GERD without esophagitis: is stable will continue protonix 40 mg daily  9. Aortic atherosclerosis: stable (ct 05-13-20) will monitor   10. Hyperlipidemia LDL goal <100: is stable LDL 58; will continue crestor 10 mg daily   11. Vitamin D deficiency: will continue vit D 50,000 units weekly has been on since March 31,2022.   12. Anemia due to stage 3a chronic kidney disease: is without change: hgb 9.5 will monitor   Will check bmp   Time spent with patient 45 minutes: coordination of care to include goals of care; medications; therapy needs.    Synthia Innocent NP [facility]  Contact [phone] Monday through Friday 8am- 5pm  After hours call [phone]

## 2020-06-27 ENCOUNTER — Encounter: Payer: Self-pay | Admitting: Internal Medicine

## 2020-06-27 ENCOUNTER — Non-Acute Institutional Stay (SKILLED_NURSING_FACILITY): Payer: Medicare Other | Admitting: Internal Medicine

## 2020-06-27 ENCOUNTER — Other Ambulatory Visit: Payer: Self-pay | Admitting: *Deleted

## 2020-06-27 DIAGNOSIS — R6520 Severe sepsis without septic shock: Secondary | ICD-10-CM

## 2020-06-27 DIAGNOSIS — G9341 Metabolic encephalopathy: Secondary | ICD-10-CM | POA: Diagnosis not present

## 2020-06-27 DIAGNOSIS — N179 Acute kidney failure, unspecified: Secondary | ICD-10-CM

## 2020-06-27 DIAGNOSIS — A409 Streptococcal sepsis, unspecified: Secondary | ICD-10-CM | POA: Diagnosis not present

## 2020-06-27 DIAGNOSIS — R600 Localized edema: Secondary | ICD-10-CM

## 2020-06-27 DIAGNOSIS — F0150 Vascular dementia without behavioral disturbance: Secondary | ICD-10-CM | POA: Diagnosis not present

## 2020-06-27 DIAGNOSIS — R339 Retention of urine, unspecified: Secondary | ICD-10-CM | POA: Diagnosis not present

## 2020-06-27 NOTE — Patient Instructions (Signed)
See assessment and plan under each diagnosis in the problem list and acutely for this visit 

## 2020-06-27 NOTE — Assessment & Plan Note (Signed)
Predischarge creatinine 0.77 and GFR greater than 60 indicating resolution of the AKI.

## 2020-06-27 NOTE — Assessment & Plan Note (Signed)
She failed voiding trial at the hospital; repeat trial scheduled for 4/12 @ SNF.

## 2020-06-27 NOTE — Progress Notes (Signed)
NURSING HOME LOCATION:  [facility] ROOM NUMBER:  [id number]  CODE STATUS:  DNR  PCP:  Doreen Beam MD  This is a comprehensive admission note to this SNFperformed on this date less than 30 days from date of admission. Included are preadmission medical/surgical history; reconciled medication list; family history; social history and comprehensive review of systems.  Corrections and additions to the records were documented. Comprehensive physical exam was also performed. Additionally a clinical summary was entered for each active diagnosis pertinent to this admission in the Problem List to enhance continuity of care.  HPI: Patient was hospitalized 3/30-06/24/2020 presenting from home to the ED with altered mental status.  Unfortunately patient is nonverbal and can provide no history.  Patient was clinically confused with decreased urine output prompting the family to call EMS. In the ED she did exhibit vomiting and had a temperature up to 102.5; tachycardia up to 118; and tachypnea with respiratory rate of 24.  Blood pressures were as low as 95/51.  White count was 19,100; lactic acid was 5.8.  After 1.75 L of lactated Ringer's lactic acid was 2.3. Empirically she was started on broad-spectrum antibiotics with cefepime, vancomycin and Flagyl.  UA suggested UTI. Head CT showed no acute abnormality.  Labs revealed glucose of 245 and AKI with a creatinine of 1.54.  Baseline is felt to be 0.9. Cultures documented group G strep sepsis.  Antibiotics were able to be deescalated to ceftriaxone 2 g IV every 24 hours.  Subsequently she was discharged on cefdinir 300 mg twice daily for 5 additional days. With sepsis resolution the acute encephalopathy and lactic acidosis resolved. Incidental finding was QT prolongation on EKG. Palliative care consulted and the patient remained DNR.  Past medical and surgical history: Includes history of spasmodic torticollis, history of renal calculi, lumbar DDD,  essential hypertension, dyslipidemia, CAD, and history of hepatitis. Surgeries and procedures include abdominal hysterectomy, cholecystectomy, CABG, lumbar laminectomy, and  urologic procedure for nephrolithiasis.  Social history: Nondrinker; never smoked.  Family history: Limited history is noncontributory due to her advanced age.   Review of systems: Clinical neurocognitive deficits made validity of responses questionable & prevented ROS completion.  She stated only "feel all right".  She denied any active symptoms. Staff reports swelling of the right hand this morning with apparent discomfort when it is palpated.  There is no known injury and the patient can give no history related to the swelling and pain.  Physical exam:  Pertinent or positive findings: She is very lethargic.  Facies are blank and mouth agape.  She appears somewhat chronically ill.  Hair is disheveled.  She has complete dentures.  Her eyes are small and the irises almost fill the area between the lids.  Heart rate is slow.  Breath sounds are decreased.  Abdomen is protuberant. Foley in place. She has trace edema at the sock line.  The dorsalis pedis pulses are strong and the posterior tibial pulses are only slightly reduced.  Her limbs reveal loss of muscle mass.  She could not follow commands to test strength.  Bruising is noted over the dorsum of the right forearm.  The right hand is visibly edematous without pitting.  She validated that that was tender when I palpated the dorsum of the hand.  General appearance: no acute distress, increased work of breathing is present.   Lymphatic: No lymphadenopathy about the head, neck, axilla. Eyes: No conjunctival inflammation or lid edema is present. There is no scleral icterus.  Ears:  External ear exam shows no significant lesions or deformities.   Nose:  External nasal examination shows no deformity or inflammation. Nasal mucosa are pink and moist without lesions, exudates Oral  exam: Lips and gums are healthy appearing.There is no oropharyngeal erythema or exudate. Neck:  No thyromegaly, masses, tenderness noted.    Heart:  No gallop, murmur, click, rub.  Lungs:  without wheezes, rhonchi, rales, rubs. Abdomen: Bowel sounds are normal.  Abdomen is soft and nontender with no organomegaly, hernias, masses. GU: Deferred  Extremities:  No cyanosis, clubbing. Neurologic exam: Balance, Rhomberg, finger to nose testing could not be completed due to clinical state Skin: Warm & dry w/o tenting. No significant  rash.  See clinical summary under each active problem in the Problem List with associated updated therapeutic plan

## 2020-06-27 NOTE — Patient Outreach (Signed)
Member screened for potential [facility] Care Management needs.  Mrs. Joswick is receiving skilled therapy at [facility].   Update received from [facility] SW indicating transition plan is to return home with daughter.   Will continue to follow for potential [facility] Care Management services while member resides in SNF.   Raiford Noble, MSN, RN,BSN [facility] Care Coordinator [phone] [facility]) [phone]  (Toll free office)

## 2020-06-27 NOTE — Assessment & Plan Note (Addendum)
She can provide no meaningful history.  She did not realize she been in the hospital.  She can provide no information concerning the swelling & tenderness of the right hand.

## 2020-06-28 ENCOUNTER — Encounter: Payer: Self-pay | Admitting: Adult Health

## 2020-06-28 ENCOUNTER — Non-Acute Institutional Stay (SKILLED_NURSING_FACILITY): Payer: Medicare Other | Admitting: Adult Health

## 2020-06-28 ENCOUNTER — Other Ambulatory Visit (HOSPITAL_COMMUNITY)
Admission: RE | Admit: 2020-06-28 | Discharge: 2020-06-28 | Disposition: A | Discharge status: Home / Self Care | Payer: Medicare Other | Source: Skilled Nursing Facility | Attending: Adult Health | Admitting: Adult Health

## 2020-06-28 DIAGNOSIS — B999 Unspecified infectious disease: Secondary | ICD-10-CM | POA: Diagnosis not present

## 2020-06-28 DIAGNOSIS — B951 Streptococcus, group B, as the cause of diseases classified elsewhere: Secondary | ICD-10-CM | POA: Insufficient documentation

## 2020-06-28 DIAGNOSIS — M7989 Other specified soft tissue disorders: Secondary | ICD-10-CM

## 2020-06-28 LAB — CBC
HCT: 27.4 % — ABNORMAL LOW (ref 36.0–46.0)
Hemoglobin: 9 g/dL — ABNORMAL LOW (ref 12.0–15.0)
MCH: 31.3 pg (ref 26.0–34.0)
MCHC: 32.8 g/dL (ref 30.0–36.0)
MCV: 95.1 fL (ref 80.0–100.0)
Platelets: 329 10*3/uL (ref 150–400)
RBC: 2.88 MIL/uL — ABNORMAL LOW (ref 3.87–5.11)
RDW: 13.3 % (ref 11.5–15.5)
WBC: 16.3 10*3/uL — ABNORMAL HIGH (ref 4.0–10.5)
nRBC: 0 % (ref 0.0–0.2)

## 2020-06-28 LAB — BASIC METABOLIC PANEL
Anion gap: 9 (ref 5–15)
BUN: 24 mg/dL — ABNORMAL HIGH (ref 8–23)
CO2: 23 mmol/L (ref 22–32)
Calcium: 8.1 mg/dL — ABNORMAL LOW (ref 8.9–10.3)
Chloride: 100 mmol/L (ref 98–111)
Creatinine, Ser: 1.1 mg/dL — ABNORMAL HIGH (ref 0.44–1.00)
GFR, Estimated: 49 mL/min — ABNORMAL LOW (ref >60)
Glucose, Bld: 141 mg/dL — ABNORMAL HIGH (ref 70–99)
Potassium: 3.5 mmol/L (ref 3.5–5.1)
Sodium: 132 mmol/L — ABNORMAL LOW (ref 135–145)

## 2020-06-28 LAB — URIC ACID: Uric Acid, Serum: 3 mg/dL (ref 2.5–7.1)

## 2020-06-28 NOTE — Progress Notes (Signed)
Location:  [facility] Room Number: 155/P Place of Service:  SNF (31)   CODE STATUS: DNR  Allergies  Allergen Reactions  . Aspirin Nausea And Vomiting    Upset stomach  . Codeine Nausea And Vomiting    Upset stomach    Chief Complaint  Patient presents with  . Acute Visit    Right hand swelling     HPI:  Staff report that she is increased right hand swelling. There is tenderness present. Small amount of redness present near thumb and wrist. There is pain present.   Past Medical History:  Diagnosis Date  . Baker's cyst, ruptured   . CAD (coronary artery disease)   . Dementia (HCC)   . History of hepatitis   . Hyperlipidemia   . Hypertension   . Lumbar degenerative disc disease   . Nephrolithiasis   . Spasmodic torticollis     Past Surgical History:  Procedure Laterality Date  . ABDOMINAL HYSTERECTOMY    . CARPAL TUNNEL RELEASE    . CHOLECYSTECTOMY    . CORONARY ARTERY BYPASS GRAFT  1998   LIMA to LAD-Dx  . ESOPHAGOGASTRODUODENOSCOPY N/A 01/15/2017   Procedure: ESOPHAGOGASTRODUODENOSCOPY (EGD);  Surgeon: Malissa Hippo, MD;  Location[facility];  Service: Endoscopy;  Laterality: N/A;  1:00  . KIDNEY STONE SURGERY    . LEFT HEART CATHETERIZATION WITH CORONARY/GRAFT ANGIOGRAM N/A 05/26/2011   Procedure: LEFT HEART CATHETERIZATION WITH Isabel Caprice;  Surgeon: Othella Boyer, MD;  Location: [facility] CATH LAB;  Service: Cardiovascular;  Laterality: N/A;  . LUMBAR LAMINECTOMY      Social History   Socioeconomic History  . Marital status: Married    Spouse name: Not on file  . Number of children: Not on file  . Years of education: Not on file  . Highest education level: Not on file  Occupational History  . Not on file  Tobacco Use  . Smoking status: Never Smoker  . Smokeless tobacco: Never Used  Vaping Use  . Vaping Use: Never used  Substance and Sexual Activity  . Alcohol use: No  . Drug use: No  . Sexual activity: Not  Currently  Other Topics Concern  . Not on file  Social History Narrative   Divorced, remarried. Lives at home with daughter   Social Determinants of Health   Financial Resource Strain: Not on file  Food Insecurity: Not on file  Transportation Needs: Not on file  Physical Activity: Not on file  Stress: Not on file  Social Connections: Not on file  Intimate Partner Violence: Not on file   Family History  Problem Relation Age of Onset  . Diabetes Sister   . Diabetes Sister       VITAL SIGNS BP (!) 99/58   Pulse (!) 59   Temp 97.6 F (36.4 C)   Resp 20   Ht 5\' 6"  (1.676 m)   Wt 119 lb (54 kg)   SpO2 97%   BMI 19.21 kg/m   Outpatient Encounter Medications as of 06/28/2020  Medication Sig  . acetaminophen (TYLENOL) 650 MG CR tablet Take 650 mg by mouth every 6 (six) hours as needed for pain.  . Amino Acids-Protein Hydrolys (FEEDING SUPPLEMENT, PRO-STAT SUGAR FREE 64,) LIQD Take 30 mLs by mouth 3 (three) times daily with meals.  08/28/2020 amLODipine (NORVASC) 10 MG tablet Take 1 tablet (10 mg total) by mouth daily.  Marland Kitchen aspirin EC 81 MG tablet Take 81 mg by mouth daily.  Marland Kitchen  cefdinir (OMNICEF) 300 MG capsule Take 1 capsule (300 mg total) by mouth 2 (two) times daily for 5 days.  . citalopram (CELEXA) 20 MG tablet Take 1 tablet (20 mg total) by mouth daily.  . clopidogrel (PLAVIX) 75 MG tablet Take 1 tablet (75 mg total) by mouth daily.  . feeding supplement (ENSURE ENLIVE / ENSURE PLUS) LIQD Take 237 mLs by mouth daily in the afternoon.  . isosorbide mononitrate (IMDUR) 30 MG 24 hr tablet Take 0.5 tablets (15 mg total) by mouth daily.  . memantine (NAMENDA) 10 MG tablet Take 10 mg by mouth 2 (two) times daily.  . nitroGLYCERIN (NITROSTAT) 0.4 MG SL tablet Place 0.4 mg under the tongue every 5 (five) minutes as needed for chest pain.  . NON FORMULARY Diet: Dysphagia 3, NAS Diet  . ondansetron (ZOFRAN ODT) 4 MG disintegrating tablet Take 1 tablet (4 mg total) by mouth every 8 (eight) hours  as needed for nausea or vomiting.  . pantoprazole (PROTONIX) 40 MG tablet Take 1 tablet (40 mg total) by mouth daily.  . polyethylene glycol (MIRALAX / GLYCOLAX) 17 g packet Take 17 g by mouth daily as needed for mild constipation.  . rosuvastatin (CRESTOR) 10 MG tablet Take 1 tablet (10 mg total) by mouth daily.  . Vitamin D, Ergocalciferol, (DRISDOL) 50000 units CAPS capsule Take 50,000 Units by mouth daily.   No facility-administered encounter medications on file as of 06/28/2020.     SIGNIFICANT DIAGNOSTIC EXAMS   PREVIOUS   06-19-20: chest x-ray:  Post sternotomy changes. No focal opacity or pleural effusion. Aortic atherosclerosis. Normal cardiac size. Scoliosis of the spine.  06-19-20: ct of head:  1. No CT evidence for acute intracranial abnormality. 2. Atrophy and mild chronic small vessel ischemic changes of the white matter.  NO NEW EXAMS   LABS REVIEWED PREVIOUS   05-16-20: chol 141 LDL 58; trig 99; hdl 63   1-32-44: wbc 19.1; hgb 10.6; hct 33.0; mcv 95.9 plt 262; glucose 244; bun 32; creat 1.54; k+ 4.2; na++ 135; ca 8.9 GFR 33; liver normal albumin 4.1 urine culture: 70,000 group B strept 06-20-20: urine culture: no growth 06-21-20: wbc 9.3; hgb 9.5; hct 29.6 mcv 97.4 plt 141; glucose 122; bun 21; creat 0.77; k+ 2.9; na++ 140; ca 8.1 GFR>60; liver normal albumin 2.5 06-24-31: glucose 118; bun 17; creat 0.77; k+ 3.5; na++ 138; ca 7.7; GFR>60; mag 1.9   NO NEW EXAMS.   Review of Systems  Unable to perform ROS: Dementia (unable to participate )   Physical Exam Constitutional:      General: She is not in acute distress.    Appearance: She is well-developed. She is not diaphoretic.  Neck:     Thyroid: No thyromegaly.  Cardiovascular:     Rate and Rhythm: Normal rate and regular rhythm.     Heart sounds: Normal heart sounds.  Pulmonary:     Effort: Pulmonary effort is normal. No respiratory distress.     Breath sounds: Normal breath sounds.  Abdominal:     General:  Bowel sounds are normal. There is no distension.     Palpations: Abdomen is soft.     Tenderness: There is no abdominal tenderness.  Musculoskeletal:        General: Normal range of motion.     Cervical back: Neck supple.     Right lower leg: No edema.     Left lower leg: No edema.     Comments: Scoliosis Right hand swelling; redness  warmth; tenderness present   Lymphadenopathy:     Cervical: No cervical adenopathy.  Skin:    General: Skin is warm and dry.  Neurological:     Mental Status: She is alert. Mental status is at baseline.  Psychiatric:        Mood and Affect: Mood normal.       ASSESSMENT/ PLAN:  TODAY  1. Right hand swelling: will get cbc ;cmp uric acid and right hand x-ray; will treat further as indicated.    Synthia Innocent NP [facility]  Contact [phone] Monday through Friday 8am- 5pm  After hours call [phone]

## 2020-06-28 NOTE — Assessment & Plan Note (Signed)
Receiving Cefdinir until  4/9; remains afebrile & clinically not septic

## 2020-06-28 NOTE — Assessment & Plan Note (Signed)
Severe dementia present

## 2020-07-02 ENCOUNTER — Other Ambulatory Visit (HOSPITAL_COMMUNITY)
Admission: RE | Admit: 2020-07-02 | Discharge: 2020-07-02 | Disposition: A | Discharge status: Home / Self Care | Payer: Medicare Other | Source: Skilled Nursing Facility | Attending: Adult Health | Admitting: Adult Health

## 2020-07-02 DIAGNOSIS — R339 Retention of urine, unspecified: Secondary | ICD-10-CM | POA: Insufficient documentation

## 2020-07-02 LAB — C DIFFICILE QUICK SCREEN W PCR REFLEX
C Diff antigen: POSITIVE — AB
C Diff interpretation: DETECTED
C Diff toxin: POSITIVE — AB

## 2020-07-03 ENCOUNTER — Non-Acute Institutional Stay (SKILLED_NURSING_FACILITY): Payer: Medicare Other | Admitting: Adult Health

## 2020-07-03 ENCOUNTER — Other Ambulatory Visit (HOSPITAL_COMMUNITY)
Admission: RE | Admit: 2020-07-03 | Discharge: 2020-07-03 | Disposition: A | Discharge status: Home / Self Care | Payer: Medicare Other | Source: Skilled Nursing Facility | Attending: Adult Health | Admitting: Adult Health

## 2020-07-03 ENCOUNTER — Encounter: Payer: Self-pay | Admitting: Adult Health

## 2020-07-03 DIAGNOSIS — N390 Urinary tract infection, site not specified: Secondary | ICD-10-CM | POA: Diagnosis present

## 2020-07-03 DIAGNOSIS — A498 Other bacterial infections of unspecified site: Secondary | ICD-10-CM | POA: Diagnosis not present

## 2020-07-03 DIAGNOSIS — E876 Hypokalemia: Secondary | ICD-10-CM

## 2020-07-03 LAB — BASIC METABOLIC PANEL
Anion gap: 9 (ref 5–15)
BUN: 16 mg/dL (ref 8–23)
CO2: 24 mmol/L (ref 22–32)
Calcium: 8.2 mg/dL — ABNORMAL LOW (ref 8.9–10.3)
Chloride: 105 mmol/L (ref 98–111)
Creatinine, Ser: 0.82 mg/dL (ref 0.44–1.00)
GFR, Estimated: >60 mL/min (ref >60)
Glucose, Bld: 113 mg/dL — ABNORMAL HIGH (ref 70–99)
Potassium: 3.4 mmol/L — ABNORMAL LOW (ref 3.5–5.1)
Sodium: 138 mmol/L (ref 135–145)

## 2020-07-03 NOTE — Progress Notes (Signed)
Location:  [facility] Room Number: 222 Place of Service:  SNF (31)   CODE STATUS: dnr  Allergies  Allergen Reactions  . Aspirin Nausea And Vomiting    Upset stomach  . Codeine Nausea And Vomiting    Upset stomach    Chief Complaint  Patient presents with  . Acute Visit    Follow up lab work     HPI:  She has had foul smelling stools over the weekend. A sample was sent and returned with c-diff. She is having several stools daily. She was placed on vancomycin through 07-12-20. No reports of fevers present. She denies any abdominal pain.   Past Medical History:  Diagnosis Date  . Baker's cyst, ruptured   . CAD (coronary artery disease)   . Dementia (HCC)   . History of hepatitis   . Hyperlipidemia   . Hypertension   . Lumbar degenerative disc disease   . Nephrolithiasis   . Spasmodic torticollis     Past Surgical History:  Procedure Laterality Date  . ABDOMINAL HYSTERECTOMY    . CARPAL TUNNEL RELEASE    . CHOLECYSTECTOMY    . CORONARY ARTERY BYPASS GRAFT  1998   LIMA to LAD-Dx  . ESOPHAGOGASTRODUODENOSCOPY N/A 01/15/2017   Procedure: ESOPHAGOGASTRODUODENOSCOPY (EGD);  Surgeon: Malissa Hippo, MD;  Location[facility];  Service: Endoscopy;  Laterality: N/A;  1:00  . KIDNEY STONE SURGERY    . LEFT HEART CATHETERIZATION WITH CORONARY/GRAFT ANGIOGRAM N/A 05/26/2011   Procedure: LEFT HEART CATHETERIZATION WITH Isabel Caprice;  Surgeon: Othella Boyer, MD;  Location: [facility] CATH LAB;  Service: Cardiovascular;  Laterality: N/A;  . LUMBAR LAMINECTOMY      Social History   Socioeconomic History  . Marital status: Married    Spouse name: Not on file  . Number of children: Not on file  . Years of education: Not on file  . Highest education level: Not on file  Occupational History  . Not on file  Tobacco Use  . Smoking status: Never Smoker  . Smokeless tobacco: Never Used  Vaping Use  . Vaping Use: Never used  Substance and  Sexual Activity  . Alcohol use: No  . Drug use: No  . Sexual activity: Not Currently  Other Topics Concern  . Not on file  Social History Narrative   Divorced, remarried. Lives at home with daughter   Social Determinants of Health   Financial Resource Strain: Not on file  Food Insecurity: Not on file  Transportation Needs: Not on file  Physical Activity: Not on file  Stress: Not on file  Social Connections: Not on file  Intimate Partner Violence: Not on file   Family History  Problem Relation Age of Onset  . Diabetes Sister   . Diabetes Sister       VITAL SIGNS BP 130/74   Pulse (!) 53   Temp (!) 97.1 F (36.2 C)   Resp 18   Ht 5\' 3"  (1.6 m)   Wt 119 lb (54 kg)   BMI 21.08 kg/m   Outpatient Encounter Medications as of 07/03/2020  Medication Sig  . acetaminophen (TYLENOL) 650 MG CR tablet Take 650 mg by mouth every 6 (six) hours as needed for pain.  . Amino Acids-Protein Hydrolys (FEEDING SUPPLEMENT, PRO-STAT SUGAR FREE 64,) LIQD Take 30 mLs by mouth 3 (three) times daily with meals.  07/05/2020 amLODipine (NORVASC) 10 MG tablet Take 1 tablet (10 mg total) by mouth daily.  Marland Kitchen  aspirin EC 81 MG tablet Take 81 mg by mouth daily.  . citalopram (CELEXA) 20 MG tablet Take 1 tablet (20 mg total) by mouth daily.  . clopidogrel (PLAVIX) 75 MG tablet Take 1 tablet (75 mg total) by mouth daily.  . feeding supplement (ENSURE ENLIVE / ENSURE PLUS) LIQD Take 237 mLs by mouth daily in the afternoon.  . isosorbide mononitrate (IMDUR) 30 MG 24 hr tablet Take 0.5 tablets (15 mg total) by mouth daily.  . memantine (NAMENDA) 10 MG tablet Take 10 mg by mouth 2 (two) times daily.  . nitroGLYCERIN (NITROSTAT) 0.4 MG SL tablet Place 0.4 mg under the tongue every 5 (five) minutes as needed for chest pain.  . NON FORMULARY Diet: Dysphagia 3, NAS Diet  . ondansetron (ZOFRAN ODT) 4 MG disintegrating tablet Take 1 tablet (4 mg total) by mouth every 8 (eight) hours as needed for nausea or vomiting.  .  pantoprazole (PROTONIX) 40 MG tablet Take 1 tablet (40 mg total) by mouth daily.  . polyethylene glycol (MIRALAX / GLYCOLAX) 17 g packet Take 17 g by mouth daily as needed for mild constipation.  . rosuvastatin (CRESTOR) 10 MG tablet Take 1 tablet (10 mg total) by mouth daily.  . Vitamin D, Ergocalciferol, (DRISDOL) 50000 units CAPS capsule Take 50,000 Units by mouth daily.   No facility-administered encounter medications on file as of 07/03/2020.     SIGNIFICANT DIAGNOSTIC EXAMS   PREVIOUS   06-19-20: chest x-ray:  Post sternotomy changes. No focal opacity or pleural effusion. Aortic atherosclerosis. Normal cardiac size. Scoliosis of the spine.  06-19-20: ct of head:  1. No CT evidence for acute intracranial abnormality. 2. Atrophy and mild chronic small vessel ischemic changes of the white matter.  NO NEW EXAMS   LABS REVIEWED PREVIOUS   05-16-20: chol 141 LDL 58; trig 99; hdl 63   06-12-53: wbc 19.1; hgb 10.6; hct 33.0; mcv 95.9 plt 262; glucose 244; bun 32; creat 1.54; k+ 4.2; na++ 135; ca 8.9 GFR 33; liver normal albumin 4.1 urine culture: 70,000 group B strept 06-20-20: urine culture: no growth 06-21-20: wbc 9.3; hgb 9.5; hct 29.6 mcv 97.4 plt 141; glucose 122; bun 21; creat 0.77; k+ 2.9; na++ 140; ca 8.1 GFR>60; liver normal albumin 2.5 06-23-20: glucose 118; bun 17; creat 0.77; k+ 3.5; na++ 138; ca 7.7; GFR>60; mag 1.9   TODAY  07-02-20: c-diff: +  07-03-20: glucose 113; bun 16; creat 0.82; k+ 3.4; na++ 138; ca 8.2 GFR>60    Review of Systems  Unable to perform ROS: Dementia (unable to participate )   Physical Exam Constitutional:      General: She is not in acute distress.    Appearance: She is well-developed. She is not diaphoretic.  Neck:     Thyroid: No thyromegaly.  Cardiovascular:     Rate and Rhythm: Normal rate and regular rhythm.     Pulses: Normal pulses.     Heart sounds: Normal heart sounds.  Pulmonary:     Effort: Pulmonary effort is normal. No respiratory  distress.     Breath sounds: Normal breath sounds.  Abdominal:     General: Bowel sounds are normal. There is no distension.     Palpations: Abdomen is soft.     Tenderness: There is no abdominal tenderness.  Musculoskeletal:        General: Normal range of motion.     Cervical back: Neck supple.     Right lower leg: No edema.  Left lower leg: No edema.     Comments: Scoliosis Right hand swelling is mild   Lymphadenopathy:     Cervical: No cervical adenopathy.  Skin:    General: Skin is warm and dry.  Neurological:     Mental Status: She is alert. Mental status is at baseline.  Psychiatric:        Mood and Affect: Mood normal.     ASSESSMENT/ PLAN:  TODAY  1. clostridioides difficile infection 2. Hypokalemia  will supplement k+ today Will complete vancomycin 125 mg every 6 hours through 05-15-20 Will monitor her status.  Will use florastor as probiotic.     Synthia Innocent NP [facility]  Contact [phone] Monday through Friday 8am- 5pm  After hours call [phone]

## 2020-07-05 LAB — OVA + PARASITE EXAM

## 2020-07-05 LAB — O&P RESULT

## 2020-07-08 ENCOUNTER — Non-Acute Institutional Stay (SKILLED_NURSING_FACILITY): Payer: Medicare Other | Admitting: Adult Health

## 2020-07-08 ENCOUNTER — Encounter: Payer: Self-pay | Admitting: Adult Health

## 2020-07-08 ENCOUNTER — Other Ambulatory Visit (HOSPITAL_COMMUNITY)
Admission: RE | Admit: 2020-07-08 | Discharge: 2020-07-08 | Disposition: A | Discharge status: Home / Self Care | Payer: Medicare Other | Source: Skilled Nursing Facility | Attending: Adult Health | Admitting: Adult Health

## 2020-07-08 DIAGNOSIS — F0390 Unspecified dementia without behavioral disturbance: Secondary | ICD-10-CM | POA: Insufficient documentation

## 2020-07-08 DIAGNOSIS — M19041 Primary osteoarthritis, right hand: Secondary | ICD-10-CM | POA: Diagnosis not present

## 2020-07-08 LAB — BASIC METABOLIC PANEL
Anion gap: 8 (ref 5–15)
BUN: 18 mg/dL (ref 8–23)
CO2: 23 mmol/L (ref 22–32)
Calcium: 8.1 mg/dL — ABNORMAL LOW (ref 8.9–10.3)
Chloride: 104 mmol/L (ref 98–111)
Creatinine, Ser: 0.79 mg/dL (ref 0.44–1.00)
GFR, Estimated: >60 mL/min (ref >60)
Glucose, Bld: 108 mg/dL — ABNORMAL HIGH (ref 70–99)
Potassium: 4 mmol/L (ref 3.5–5.1)
Sodium: 135 mmol/L (ref 135–145)

## 2020-07-08 NOTE — Progress Notes (Signed)
Location:  [facility] Room Number: 155/P Place of Service:  SNF (31)   CODE STATUS: DNR  Allergies  Allergen Reactions  . Aspirin Nausea And Vomiting    Upset stomach  . Codeine Nausea And Vomiting    Upset stomach    Chief Complaint  Patient presents with  . Acute Visit    Right Hand Bump     HPI:  She has been having increased right hand swelling. Her x-rays have been negative and is negative for dvt. The swelling is less. She does have pain present. There is no heat or redness present. There are no fevers present.   Past Medical History:  Diagnosis Date  . Baker's cyst, ruptured   . CAD (coronary artery disease)   . Dementia (HCC)   . History of hepatitis   . Hyperlipidemia   . Hypertension   . Lumbar degenerative disc disease   . Nephrolithiasis   . Spasmodic torticollis     Past Surgical History:  Procedure Laterality Date  . ABDOMINAL HYSTERECTOMY    . CARPAL TUNNEL RELEASE    . CHOLECYSTECTOMY    . CORONARY ARTERY BYPASS GRAFT  1998   LIMA to LAD-Dx  . ESOPHAGOGASTRODUODENOSCOPY N/A 01/15/2017   Procedure: ESOPHAGOGASTRODUODENOSCOPY (EGD);  Surgeon: Malissa Hippo, MD;  [facility];  Service: Endoscopy;  Laterality: N/A;  1:00  . KIDNEY STONE SURGERY    . LEFT HEART CATHETERIZATION WITH CORONARY/GRAFT ANGIOGRAM N/A 05/26/2011   Procedure: LEFT HEART CATHETERIZATION WITH Isabel Caprice;  Surgeon: Othella Boyer, MD;  Location: [facility] CATH LAB;  Service: Cardiovascular;  Laterality: N/A;  . LUMBAR LAMINECTOMY      Social History   Socioeconomic History  . Marital status: Married    Spouse name: Not on file  . Number of children: Not on file  . Years of education: Not on file  . Highest education level: Not on file  Occupational History  . Not on file  Tobacco Use  . Smoking status: Never Smoker  . Smokeless tobacco: Never Used  Vaping Use  . Vaping Use: Never used  Substance and Sexual Activity  .  Alcohol use: No  . Drug use: No  . Sexual activity: Not Currently  Other Topics Concern  . Not on file  Social History Narrative   Divorced, remarried. Lives at home with daughter   Social Determinants of Health   Financial Resource Strain: Not on file  Food Insecurity: Not on file  Transportation Needs: Not on file  Physical Activity: Not on file  Stress: Not on file  Social Connections: Not on file  Intimate Partner Violence: Not on file   Family History  Problem Relation Age of Onset  . Diabetes Sister   . Diabetes Sister       VITAL SIGNS BP 134/68   Pulse 70   Temp 97.6 F (36.4 C)   Resp 20   Ht 5\' 3"  (1.6 m)   Wt 119 lb (54 kg)   SpO2 97%   BMI 21.08 kg/m   Outpatient Encounter Medications as of 07/08/2020  Medication Sig  . acetaminophen (TYLENOL) 650 MG CR tablet Take 650 mg by mouth every 6 (six) hours as needed for pain.  . Amino Acids-Protein Hydrolys (FEEDING SUPPLEMENT, PRO-STAT SUGAR FREE 64,) LIQD Take 30 mLs by mouth 3 (three) times daily with meals.  07/10/2020 amLODipine (NORVASC) 10 MG tablet Take 1 tablet (10 mg total) by mouth daily.  Marland Kitchen aspirin  EC 81 MG tablet Take 81 mg by mouth daily.  . citalopram (CELEXA) 20 MG tablet Take 1 tablet (20 mg total) by mouth daily.  . clopidogrel (PLAVIX) 75 MG tablet Take 1 tablet (75 mg total) by mouth daily.  . diclofenac Sodium (VOLTAREN) 1 % GEL Apply topically 3 (three) times daily. Special Instructions: apply to right hand for pain and swelling  . feeding supplement (ENSURE ENLIVE / ENSURE PLUS) LIQD Take 237 mLs by mouth daily in the afternoon.  . isosorbide mononitrate (IMDUR) 30 MG 24 hr tablet Take 0.5 tablets (15 mg total) by mouth daily.  . memantine (NAMENDA) 10 MG tablet Take 10 mg by mouth 2 (two) times daily.  . nitroGLYCERIN (NITROSTAT) 0.4 MG SL tablet Place 0.4 mg under the tongue every 5 (five) minutes as needed for chest pain.  . NON FORMULARY Diet: Dysphagia 3, NAS Diet  . ondansetron (ZOFRAN ODT)  4 MG disintegrating tablet Take 1 tablet (4 mg total) by mouth every 8 (eight) hours as needed for nausea or vomiting.  . pantoprazole (PROTONIX) 40 MG tablet Take 1 tablet (40 mg total) by mouth daily.  . polyethylene glycol (MIRALAX / GLYCOLAX) 17 g packet Take 17 g by mouth daily as needed for mild constipation.  . rosuvastatin (CRESTOR) 10 MG tablet Take 1 tablet (10 mg total) by mouth daily.  Marland Kitchen saccharomyces boulardii (FLORASTOR) 250 MG capsule Take 250 mg by mouth 2 (two) times daily.  . Vitamin D, Ergocalciferol, (DRISDOL) 50000 units CAPS capsule Take 50,000 Units by mouth every 7 (seven) days. On Wednesday   No facility-administered encounter medications on file as of 07/08/2020.     SIGNIFICANT DIAGNOSTIC EXAMS  PREVIOUS   06-19-20: chest x-ray:  Post sternotomy changes. No focal opacity or pleural effusion. Aortic atherosclerosis. Normal cardiac size. Scoliosis of the spine.  06-19-20: ct of head:  1. No CT evidence for acute intracranial abnormality. 2. Atrophy and mild chronic small vessel ischemic changes of the white matter.  TODAY  06-28-20: right hand x-ray:  1. No definite radiographic evidence of acute fracture or dislocation. If there are persistent symptoms, followup x ray may be obtained as clinically warranted.  2. Mild degree of osteopenia.  3. Moderate osteoarthritis.  07-06-20: right upper extremity venous doppler 1. No evidence of acute DVT of the right lower extremity is demonstrated  07-08-20 right hand and wrist x-ray: no bone abnormality      LABS REVIEWED PREVIOUS   05-16-20: chol 141 LDL 58; trig 99; hdl 63   0-53-97: wbc 19.1; hgb 10.6; hct 33.0; mcv 95.9 plt 262; glucose 244; bun 32; creat 1.54; k+ 4.2; na++ 135; ca 8.9 GFR 33; liver normal albumin 4.1 urine culture: 70,000 group B strept 06-20-20: urine culture: no growth 06-21-20: wbc 9.3; hgb 9.5; hct 29.6 mcv 97.4 plt 141; glucose 122; bun 21; creat 0.77; k+ 2.9; na++ 140; ca 8.1 GFR>60; liver  normal albumin 2.5 06-23-20: glucose 118; bun 17; creat 0.77; k+ 3.5; na++ 138; ca 7.7; GFR>60; mag 1.9  07-02-20: c-diff: +  07-03-20: glucose 113; bun 16; creat 0.82; k+ 3.4; na++ 138; ca 8.2 GFR>60   NO NEW LABS.     Review of Systems  Unable to perform ROS: Dementia (unable to participate )    Physical Exam Constitutional:      General: She is not in acute distress.    Appearance: She is well-developed. She is not diaphoretic.  Neck:     Thyroid: No thyromegaly.  Cardiovascular:     Rate and Rhythm: Normal rate and regular rhythm.     Pulses: Normal pulses.     Heart sounds: Normal heart sounds.  Pulmonary:     Effort: Pulmonary effort is normal. No respiratory distress.     Breath sounds: Normal breath sounds.  Abdominal:     General: Bowel sounds are normal. There is no distension.     Palpations: Abdomen is soft.     Tenderness: There is no abdominal tenderness.  Musculoskeletal:     Cervical back: Neck supple.     Right lower leg: No edema.     Left lower leg: No edema.     Comments: Is able to move all extremities  Scoliosis Right hand swelling is mild; is tender to touch   Lymphadenopathy:     Cervical: No cervical adenopathy.  Skin:    General: Skin is warm and dry.  Neurological:     Mental Status: She is alert. Mental status is at baseline.  Psychiatric:        Mood and Affect: Mood normal.        ASSESSMENT/ PLAN:  TODAY  1. Right hand osteoarthritis:  Hand edema is improving:  Will begin voltaren gel:  2 gm to right hand three times daily    Synthia Innocent NP [facility] Adult Medicine  Contact [phone] Monday through Friday 8am- 5pm  After hours call [phone]

## 2020-07-09 DIAGNOSIS — A498 Other bacterial infections of unspecified site: Secondary | ICD-10-CM | POA: Insufficient documentation

## 2020-07-12 DIAGNOSIS — M19041 Primary osteoarthritis, right hand: Secondary | ICD-10-CM | POA: Insufficient documentation

## 2020-07-16 ENCOUNTER — Non-Acute Institutional Stay (SKILLED_NURSING_FACILITY): Payer: Medicare Other | Admitting: Adult Health

## 2020-07-16 ENCOUNTER — Encounter: Payer: Self-pay | Admitting: Adult Health

## 2020-07-16 DIAGNOSIS — F0150 Vascular dementia without behavioral disturbance: Secondary | ICD-10-CM

## 2020-07-16 DIAGNOSIS — R634 Abnormal weight loss: Secondary | ICD-10-CM | POA: Diagnosis not present

## 2020-07-16 NOTE — Progress Notes (Signed)
Location:  [facility] Room Number: 155/P Place of Service:  SNF (31)   CODE STATUS: DNR  Allergies  Allergen Reactions  . Aspirin Nausea And Vomiting    Upset stomach  . Codeine Nausea And Vomiting    Upset stomach    Chief Complaint  Patient presents with  . Acute Visit    Weight Loss    HPI:  She is losing weight. On 06-25-20 she was 120.8 pounds and on 07-14-20: 113.8 pounds. She has been treated for c-diff. Her po intake is fair to good. She is presently taking ensure daily; she is taking crestor for hyperlipidemia. She states that she is not very hungry. She denies any pain.   Past Medical History:  Diagnosis Date  . Baker's cyst, ruptured   . CAD (coronary artery disease)   . Dementia (HCC)   . History of hepatitis   . Hyperlipidemia   . Hypertension   . Lumbar degenerative disc disease   . Nephrolithiasis   . Spasmodic torticollis     Past Surgical History:  Procedure Laterality Date  . ABDOMINAL HYSTERECTOMY    . CARPAL TUNNEL RELEASE    . CHOLECYSTECTOMY    . CORONARY ARTERY BYPASS GRAFT  1998   LIMA to LAD-Dx  . ESOPHAGOGASTRODUODENOSCOPY N/A 01/15/2017   Procedure: ESOPHAGOGASTRODUODENOSCOPY (EGD);  Surgeon: Malissa Hippo, MD;  [facility];  Service: Endoscopy;  Laterality: N/A;  1:00  . KIDNEY STONE SURGERY    . LEFT HEART CATHETERIZATION WITH CORONARY/GRAFT ANGIOGRAM N/A 05/26/2011   Procedure: LEFT HEART CATHETERIZATION WITH Isabel Caprice;  Surgeon: Othella Boyer, MD;  Location: [facility] CATH LAB;  Service: Cardiovascular;  Laterality: N/A;  . LUMBAR LAMINECTOMY      Social History   Socioeconomic History  . Marital status: Married    Spouse name: Not on file  . Number of children: Not on file  . Years of education: Not on file  . Highest education level: Not on file  Occupational History  . Not on file  Tobacco Use  . Smoking status: Never Smoker  . Smokeless tobacco: Never Used  Vaping Use  .  Vaping Use: Never used  Substance and Sexual Activity  . Alcohol use: No  . Drug use: No  . Sexual activity: Not Currently  Other Topics Concern  . Not on file  Social History Narrative   Divorced, remarried. Lives at home with daughter   Social Determinants of Health   Financial Resource Strain: Not on file  Food Insecurity: Not on file  Transportation Needs: Not on file  Physical Activity: Not on file  Stress: Not on file  Social Connections: Not on file  Intimate Partner Violence: Not on file   Family History  Problem Relation Age of Onset  . Diabetes Sister   . Diabetes Sister       VITAL SIGNS BP 123/69   Pulse 80   Temp (!) 97.2 F (36.2 C)   Resp 18   Ht 5\' 3"  (1.6 m)   Wt 119 lb (54 kg)   SpO2 94%   BMI 21.08 kg/m   Outpatient Encounter Medications as of 07/16/2020  Medication Sig  . acetaminophen (TYLENOL) 650 MG CR tablet Take 650 mg by mouth every 6 (six) hours as needed for pain.  . Amino Acids-Protein Hydrolys (FEEDING SUPPLEMENT, PRO-STAT SUGAR FREE 64,) LIQD Take 30 mLs by mouth 3 (three) times daily with meals.  07/18/2020 amLODipine (NORVASC) 10 MG tablet  Take 1 tablet (10 mg total) by mouth daily.  Marland Kitchen aspirin EC 81 MG tablet Take 81 mg by mouth daily.  . citalopram (CELEXA) 20 MG tablet Take 1 tablet (20 mg total) by mouth daily.  . clopidogrel (PLAVIX) 75 MG tablet Take 1 tablet (75 mg total) by mouth daily.  . diclofenac Sodium (VOLTAREN) 1 % GEL Apply topically 3 (three) times daily. Special Instructions: apply to right hand for pain and swelling  . feeding supplement (ENSURE ENLIVE / ENSURE PLUS) LIQD Take 237 mLs by mouth 2 (two) times daily between meals.  . isosorbide mononitrate (IMDUR) 30 MG 24 hr tablet Take 0.5 tablets (15 mg total) by mouth daily.  . memantine (NAMENDA) 10 MG tablet Take 10 mg by mouth 2 (two) times daily.  . nitroGLYCERIN (NITROSTAT) 0.4 MG SL tablet Place 0.4 mg under the tongue every 5 (five) minutes as needed for chest pain.   . NON FORMULARY Diet: Dysphagia 3, NAS Diet  . ondansetron (ZOFRAN ODT) 4 MG disintegrating tablet Take 1 tablet (4 mg total) by mouth every 8 (eight) hours as needed for nausea or vomiting.  . pantoprazole (PROTONIX) 40 MG tablet Take 1 tablet (40 mg total) by mouth daily.  . polyethylene glycol (MIRALAX / GLYCOLAX) 17 g packet Take 17 g by mouth daily as needed for mild constipation.  . saccharomyces boulardii (FLORASTOR) 250 MG capsule Take 250 mg by mouth 2 (two) times daily. From 07/04/2020-08/03/2020. Then on 08/04/2020 start 250 mg by mouth daily  . Vitamin D, Ergocalciferol, (DRISDOL) 50000 units CAPS capsule Take 50,000 Units by mouth every 7 (seven) days. On Wednesday  . [DISCONTINUED] rosuvastatin (CRESTOR) 10 MG tablet Take 1 tablet (10 mg total) by mouth daily.   No facility-administered encounter medications on file as of 07/16/2020.     SIGNIFICANT DIAGNOSTIC EXAMS  PREVIOUS   06-19-20: chest x-ray:  Post sternotomy changes. No focal opacity or pleural effusion. Aortic atherosclerosis. Normal cardiac size. Scoliosis of the spine.  06-19-20: ct of head:  1. No CT evidence for acute intracranial abnormality. 2. Atrophy and mild chronic small vessel ischemic changes of the white matter.  06-28-20: right hand x-ray:  1. No definite radiographic evidence of acute fracture or dislocation. If there are persistent symptoms, followup x ray may be obtained as clinically warranted.  2. Mild degree of osteopenia.  3. Moderate osteoarthritis.  07-06-20: right upper extremity venous doppler 1. No evidence of acute DVT of the right lower extremity is demonstrated  07-08-20 right hand and wrist x-ray: no bone abnormality     NO NEW EXAMS.   LABS REVIEWED PREVIOUS   05-16-20: chol 141 LDL 58; trig 99; hdl 63   04-18-49: wbc 19.1; hgb 10.6; hct 33.0; mcv 95.9 plt 262; glucose 244; bun 32; creat 1.54; k+ 4.2; na++ 135; ca 8.9 GFR 33; liver normal albumin 4.1 urine culture: 70,000 group B  strept 06-20-20: urine culture: no growth 06-21-20: wbc 9.3; hgb 9.5; hct 29.6 mcv 97.4 plt 141; glucose 122; bun 21; creat 0.77; k+ 2.9; na++ 140; ca 8.1 GFR>60; liver normal albumin 2.5 06-23-20: glucose 118; bun 17; creat 0.77; k+ 3.5; na++ 138; ca 7.7; GFR>60; mag 1.9  07-02-20: c-diff: +  07-03-20: glucose 113; bun 16; creat 0.82; k+ 3.4; na++ 138; ca 8.2 GFR>60   TODAY  07-08-20: glucose 108; bun 18; creat 0.79; k+ 4.0; na++ 135; ca 8.1 GFR>60     Review of Systems  Unable to perform ROS: Dementia (unable to  fully participate )    Physical Exam Constitutional:      General: She is not in acute distress.    Appearance: She is well-developed. She is not diaphoretic.  Neck:     Thyroid: No thyromegaly.  Cardiovascular:     Rate and Rhythm: Normal rate and regular rhythm.     Pulses: Normal pulses.     Heart sounds: Normal heart sounds.  Pulmonary:     Effort: Pulmonary effort is normal. No respiratory distress.     Breath sounds: Normal breath sounds.  Abdominal:     General: Bowel sounds are normal. There is no distension.     Palpations: Abdomen is soft.     Tenderness: There is no abdominal tenderness.  Musculoskeletal:     Cervical back: Neck supple.     Right lower leg: No edema.     Left lower leg: No edema.     Comments:  Is able to move all extremities  Scoliosis Right hand swelling is mild  Lymphadenopathy:     Cervical: No cervical adenopathy.  Skin:    General: Skin is warm and dry.  Neurological:     Mental Status: She is alert. Mental status is at baseline.  Psychiatric:        Mood and Affect: Mood normal.        ASSESSMENT/ PLAN:  TODAY  1. Vascular dementia without behavioral disturbance 2. Weight loss nonintentional    Her weight is due to several issues: recent hospitalization; c-diff infection Will stop crestor at this time due to weight loss Will increase ensure enlive to twice daily between meals.       Synthia Innocent NP [facility]  Adult Medicine  Contact [phone] Monday through Friday 8am- 5pm  After hours call [phone]

## 2020-07-17 DIAGNOSIS — N1831 Chronic kidney disease, stage 3a: Secondary | ICD-10-CM | POA: Diagnosis not present

## 2020-07-17 DIAGNOSIS — E43 Unspecified severe protein-calorie malnutrition: Secondary | ICD-10-CM | POA: Diagnosis not present

## 2020-07-17 DIAGNOSIS — R339 Retention of urine, unspecified: Secondary | ICD-10-CM | POA: Diagnosis not present

## 2020-07-17 DIAGNOSIS — G3184 Mild cognitive impairment, so stated: Secondary | ICD-10-CM | POA: Diagnosis not present

## 2020-07-17 DIAGNOSIS — Z515 Encounter for palliative care: Secondary | ICD-10-CM | POA: Diagnosis not present

## 2020-07-17 DIAGNOSIS — I25119 Atherosclerotic heart disease of native coronary artery with unspecified angina pectoris: Secondary | ICD-10-CM | POA: Diagnosis not present

## 2020-07-17 DIAGNOSIS — Z960 Presence of urogenital implants: Secondary | ICD-10-CM | POA: Diagnosis not present

## 2020-07-17 DIAGNOSIS — R634 Abnormal weight loss: Secondary | ICD-10-CM | POA: Insufficient documentation

## 2020-07-22 ENCOUNTER — Encounter: Payer: Self-pay | Admitting: Adult Health

## 2020-07-22 ENCOUNTER — Other Ambulatory Visit: Payer: Self-pay | Admitting: Adult Health

## 2020-07-22 ENCOUNTER — Non-Acute Institutional Stay (SKILLED_NURSING_FACILITY): Payer: Medicare Other | Admitting: Adult Health

## 2020-07-22 ENCOUNTER — Ambulatory Visit: Payer: Medicare Other | Admitting: Cardiovascular Disease

## 2020-07-22 DIAGNOSIS — F0150 Vascular dementia without behavioral disturbance: Secondary | ICD-10-CM

## 2020-07-22 DIAGNOSIS — N1831 Chronic kidney disease, stage 3a: Secondary | ICD-10-CM | POA: Diagnosis not present

## 2020-07-22 DIAGNOSIS — R6520 Severe sepsis without septic shock: Secondary | ICD-10-CM

## 2020-07-22 DIAGNOSIS — A409 Streptococcal sepsis, unspecified: Secondary | ICD-10-CM

## 2020-07-22 DIAGNOSIS — N179 Acute kidney failure, unspecified: Secondary | ICD-10-CM

## 2020-07-22 DIAGNOSIS — E43 Unspecified severe protein-calorie malnutrition: Secondary | ICD-10-CM | POA: Diagnosis not present

## 2020-07-22 MED ORDER — PANTOPRAZOLE SODIUM 40 MG PO TBEC: 40.0000 mg | DELAYED_RELEASE_TABLET | Freq: Every day | ORAL | 0 refills | Status: AC

## 2020-07-22 MED ORDER — AMLODIPINE BESYLATE 10 MG PO TABS: 10.0000 mg | ORAL_TABLET | Freq: Every day | ORAL | 0 refills | Status: AC

## 2020-07-22 MED ORDER — VITAMIN D (ERGOCALCIFEROL) 1.25 MG (50000 UNIT) PO CAPS: 50000.0000 [IU] | ORAL_CAPSULE | ORAL | 0 refills | Status: AC

## 2020-07-22 MED ORDER — MEMANTINE HCL 10 MG PO TABS: 10.0000 mg | ORAL_TABLET | Freq: Two times a day (BID) | ORAL | 0 refills | Status: AC

## 2020-07-22 MED ORDER — ONDANSETRON 4 MG PO TBDP: 4.0000 mg | ORAL_TABLET | Freq: Three times a day (TID) | ORAL | 0 refills | Status: AC | PRN

## 2020-07-22 MED ORDER — CITALOPRAM HYDROBROMIDE 20 MG PO TABS: 20.0000 mg | ORAL_TABLET | Freq: Every day | ORAL | 0 refills | Status: AC

## 2020-07-22 MED ORDER — DICLOFENAC SODIUM 1 % EX GEL: 2.0000 g | Freq: Three times a day (TID) | CUTANEOUS | 0 refills | Status: AC

## 2020-07-22 MED ORDER — CLOPIDOGREL BISULFATE 75 MG PO TABS: 75.0000 mg | ORAL_TABLET | Freq: Every day | ORAL | 0 refills | Status: AC

## 2020-07-22 MED ORDER — ISOSORBIDE MONONITRATE ER 30 MG PO TB24: 15.0000 mg | ORAL_TABLET | Freq: Every day | ORAL | 0 refills | Status: AC

## 2020-07-22 MED ORDER — NITROGLYCERIN 0.4 MG SL SUBL: 0.4000 mg | SUBLINGUAL_TABLET | SUBLINGUAL | 0 refills | Status: AC | PRN

## 2020-07-22 NOTE — Progress Notes (Signed)
[facility] Room Number: 222 Place of Service:  SNF (31)    CODE STATUS: dnr   Allergies  Allergen Reactions  . Aspirin Nausea And Vomiting    Upset stomach  . Codeine Nausea And Vomiting    Upset stomach    Chief Complaint  Patient presents with  . Discharge Note    HPI:  He is being discharged to home with home health for pt/ot. She will not need any dme. She will need her prescriptions written and will need to follow up with her medical provider. She had been hospitalized for sepsis to staphylococcus. She was admitted to this facility for short term rehab. She has participated in physical and occupation therapy. She is close to her baseline and is able to complete her therapy at home. She does live with family.    Past Medical History:  Diagnosis Date  . Baker's cyst, ruptured   . CAD (coronary artery disease)   . Dementia (HCC)   . History of hepatitis   . Hyperlipidemia   . Hypertension   . Lumbar degenerative disc disease   . Nephrolithiasis   . Spasmodic torticollis     Past Surgical History:  Procedure Laterality Date  . ABDOMINAL HYSTERECTOMY    . CARPAL TUNNEL RELEASE    . CHOLECYSTECTOMY    . CORONARY ARTERY BYPASS GRAFT  1998   LIMA to LAD-Dx  . ESOPHAGOGASTRODUODENOSCOPY N/A 01/15/2017   Procedure: ESOPHAGOGASTRODUODENOSCOPY (EGD);  Surgeon: Malissa Hippo, MD;  [facility];  Service: Endoscopy;  Laterality: N/A;  1:00  . KIDNEY STONE SURGERY    . LEFT HEART CATHETERIZATION WITH CORONARY/GRAFT ANGIOGRAM N/A 05/26/2011   Procedure: LEFT HEART CATHETERIZATION WITH Isabel Caprice;  Surgeon: Othella Boyer, MD;  Location: [facility] CATH LAB;  Service: Cardiovascular;  Laterality: N/A;  . LUMBAR LAMINECTOMY      Social History   Socioeconomic History  . Marital status: Married    Spouse name: Not on file  . Number of children: Not on file  . Years of education: Not on file  . Highest education level:  Not on file  Occupational History  . Not on file  Tobacco Use  . Smoking status: Never Smoker  . Smokeless tobacco: Never Used  Vaping Use  . Vaping Use: Never used  Substance and Sexual Activity  . Alcohol use: No  . Drug use: No  . Sexual activity: Not Currently  Other Topics Concern  . Not on file  Social History Narrative   Divorced, remarried. Lives at home with daughter   Social Determinants of Health   Financial Resource Strain: Not on file  Food Insecurity: Not on file  Transportation Needs: Not on file  Physical Activity: Not on file  Stress: Not on file  Social Connections: Not on file  Intimate Partner Violence: Not on file   Family History  Problem Relation Age of Onset  . Diabetes Sister   . Diabetes Sister     VITAL SIGNS BP 123/69   Pulse 80   Temp (!) 97.2 F (36.2 C)   Resp 18   Ht 5\' 6"  (1.676 m)   Wt 119 lb (54 kg)   SpO2 94%   BMI 19.21 kg/m   Patient's Medications  New Prescriptions   No medications on file  Previous Medications   ACETAMINOPHEN (TYLENOL) 650 MG CR TABLET    Take 650 mg by mouth every 6 (six) hours as  needed for pain.   AMINO ACIDS-PROTEIN HYDROLYS (FEEDING SUPPLEMENT, PRO-STAT SUGAR FREE 64,) LIQD    Take 30 mLs by mouth 3 (three) times daily with meals.   AMLODIPINE (NORVASC) 10 MG TABLET    Take 1 tablet (10 mg total) by mouth daily.   ASPIRIN EC 81 MG TABLET    Take 81 mg by mouth daily.   CITALOPRAM (CELEXA) 20 MG TABLET    Take 1 tablet (20 mg total) by mouth daily.   CLOPIDOGREL (PLAVIX) 75 MG TABLET    Take 1 tablet (75 mg total) by mouth daily.   DICLOFENAC SODIUM (VOLTAREN) 1 % GEL    Apply topically 3 (three) times daily. Special Instructions: apply to right hand for pain and swelling   FEEDING SUPPLEMENT (ENSURE ENLIVE / ENSURE PLUS) LIQD    Take 237 mLs by mouth 2 (two) times daily between meals.   ISOSORBIDE MONONITRATE (IMDUR) 30 MG 24 HR TABLET    Take 0.5 tablets (15 mg total) by mouth daily.   MEMANTINE  (NAMENDA) 10 MG TABLET    Take 10 mg by mouth 2 (two) times daily.   NITROGLYCERIN (NITROSTAT) 0.4 MG SL TABLET    Place 0.4 mg under the tongue every 5 (five) minutes as needed for chest pain.   NON FORMULARY    Diet: Dysphagia 3, NAS Diet   ONDANSETRON (ZOFRAN ODT) 4 MG DISINTEGRATING TABLET    Take 1 tablet (4 mg total) by mouth every 8 (eight) hours as needed for nausea or vomiting.   PANTOPRAZOLE (PROTONIX) 40 MG TABLET    Take 1 tablet (40 mg total) by mouth daily.   POLYETHYLENE GLYCOL (MIRALAX / GLYCOLAX) 17 G PACKET    Take 17 g by mouth daily as needed for mild constipation.   SACCHAROMYCES BOULARDII (FLORASTOR) 250 MG CAPSULE    Take 250 mg by mouth 2 (two) times daily. From 07/04/2020-08/03/2020. Then on 08/04/2020 start 250 mg by mouth daily   VITAMIN D, ERGOCALCIFEROL, (DRISDOL) 50000 UNITS CAPS CAPSULE    Take 50,000 Units by mouth every 7 (seven) days. On Wednesday  Modified Medications   No medications on file  Discontinued Medications   No medications on file     SIGNIFICANT DIAGNOSTIC EXAMS   PREVIOUS   06-19-20: chest x-ray:  Post sternotomy changes. No focal opacity or pleural effusion. Aortic atherosclerosis. Normal cardiac size. Scoliosis of the spine.  06-19-20: ct of head:  1. No CT evidence for acute intracranial abnormality. 2. Atrophy and mild chronic small vessel ischemic changes of the white matter.  06-28-20: right hand x-ray:  1. No definite radiographic evidence of acute fracture or dislocation. If there are persistent symptoms, followup x ray may be obtained as clinically warranted.  2. Mild degree of osteopenia.  3. Moderate osteoarthritis.  07-06-20: right upper extremity venous doppler 1. No evidence of acute DVT of the right lower extremity is demonstrated  07-08-20 right hand and wrist x-ray: no bone abnormality     NO NEW EXAMS.   LABS REVIEWED PREVIOUS   05-16-20: chol 141 LDL 58; trig 99; hdl 63   5-88-50: wbc 19.1; hgb 10.6; hct 33.0; mcv  95.9 plt 262; glucose 244; bun 32; creat 1.54; k+ 4.2; na++ 135; ca 8.9 GFR 33; liver normal albumin 4.1 urine culture: 70,000 group B strept 06-20-20: urine culture: no growth 06-21-20: wbc 9.3; hgb 9.5; hct 29.6 mcv 97.4 plt 141; glucose 122; bun 21; creat 0.77; k+ 2.9; na++ 140; ca 8.1 GFR>60;  liver normal albumin 2.5 06-23-20: glucose 118; bun 17; creat 0.77; k+ 3.5; na++ 138; ca 7.7; GFR>60; mag 1.9  07-02-20: c-diff: +  07-03-20: glucose 113; bun 16; creat 0.82; k+ 3.4; na++ 138; ca 8.2 GFR>60  07-08-20: glucose 108; bun 18; creat 0.79; k+ 4.0; na++ 135; ca 8.1 GFR>60    NO NEW LABS.   Review of Systems  Unable to perform ROS: Dementia (unable to fully participate )   Physical Exam Constitutional:      General: She is not in acute distress.    Appearance: She is well-developed. She is not diaphoretic.  Neck:     Thyroid: No thyromegaly.  Cardiovascular:     Rate and Rhythm: Normal rate and regular rhythm.     Pulses: Normal pulses.     Heart sounds: Normal heart sounds.  Pulmonary:     Effort: Pulmonary effort is normal. No respiratory distress.     Breath sounds: Normal breath sounds.  Abdominal:     General: Bowel sounds are normal. There is no distension.     Palpations: Abdomen is soft.     Tenderness: There is no abdominal tenderness.  Musculoskeletal:     Cervical back: Neck supple.     Right lower leg: No edema.     Left lower leg: No edema.     Comments:   Is able to move all extremities  Scoliosis  Lymphadenopathy:     Cervical: No cervical adenopathy.  Skin:    General: Skin is warm and dry.  Neurological:     Mental Status: She is alert. Mental status is at baseline.  Psychiatric:        Mood and Affect: Mood normal.       ASSESSMENT/ PLAN:  Patient is being discharged with the following home health services:  Pt/ot to evaluate and treat as indicated for gait balance strength adl training  Patient is being discharged with the following durable medical  equipment:  None needed   Patient has been advised to f/u with their PCP in 1-2 weeks to bring them up to date on their rehab stay.  Social services at facility was responsible for arranging this appointment.  Pt was provided with a 30 day supply of prescriptions for medications and refills must be obtained from their PCP.  For controlled substances, a more limited supply may be provided adequate until PCP appointment only.   30 day supply of her prescription medications have been sent to [facility] in [place]  Time spent with patient: 40 minutes: home health medications; dme.    Synthia Innocent NP [facility] Adult Medicine  Contact [phone] Monday through Friday 8am- 5pm  After hours call [phone]

## 2020-07-26 ENCOUNTER — Other Ambulatory Visit: Payer: Self-pay | Admitting: *Deleted

## 2020-07-26 DIAGNOSIS — E43 Unspecified severe protein-calorie malnutrition: Secondary | ICD-10-CM | POA: Diagnosis not present

## 2020-07-26 DIAGNOSIS — Z515 Encounter for palliative care: Secondary | ICD-10-CM | POA: Diagnosis not present

## 2020-07-26 DIAGNOSIS — R339 Retention of urine, unspecified: Secondary | ICD-10-CM | POA: Diagnosis not present

## 2020-07-26 DIAGNOSIS — I25119 Atherosclerotic heart disease of native coronary artery with unspecified angina pectoris: Secondary | ICD-10-CM | POA: Diagnosis not present

## 2020-07-26 DIAGNOSIS — N1831 Chronic kidney disease, stage 3a: Secondary | ICD-10-CM | POA: Diagnosis not present

## 2020-07-26 DIAGNOSIS — Z960 Presence of urogenital implants: Secondary | ICD-10-CM | POA: Diagnosis not present

## 2020-07-26 DIAGNOSIS — G3184 Mild cognitive impairment, so stated: Secondary | ICD-10-CM | POA: Diagnosis not present

## 2020-07-26 NOTE — Patient Outreach (Signed)
[facility] Post-Acute Care Coordinator follow up. Member screened for potential [facility] Care Management needs.  Per [facility] (Patient Gloria Rowland) member transitioned from [facility] on 07/25/20. Telephone call made to numbers on file. No answer.   Will plan outreach again on Monday to attempt to discuss [facility] Care Management services.    Gloria Noble, MSN, RN,BSN [facility] Post Acute Care Coordinator [phone] [facility]) [phone]  (Toll free office)

## 2020-07-29 ENCOUNTER — Other Ambulatory Visit: Payer: Self-pay | Admitting: *Deleted

## 2020-07-29 DIAGNOSIS — I10 Essential (primary) hypertension: Secondary | ICD-10-CM

## 2020-07-29 NOTE — Patient Outreach (Addendum)
[facility] Post- Acute Care Coordinator follow up. Member screened for potential [facility] Care Management needs.  Gloria Rowland transitioned from [facility] on 07/25/20. Attempted to reach daugher/DPR/HCPOA Eddie North to discuss [facility] Care Management follow up. No answer.   Addendum: Telephone call received from daughter Vannah Nadal (daughter/DPR/HCPOA) [phone]. Patient identifiers confirmed. Diane states member lives with her. She is supposed to have [facility]. However, she has not heard from home health agency yet.   Explained [facility] Care Management services. Diane is agreeable. Confirms Dr. Sherril Croon is PCP. Diane states she recently had knee surgery herself and is recovering. States she will reschedule PCP appointment for later.   Confirmed with [facility] SW that [facility] was arranged. Writer to follow up with [facility].   Will make referral to [facility] Care Management RNCM for care coordination. Mrs. Harries has history of HTN, CAD, dementia, HLD, nephrolithiasis.     Raiford Noble, MSN, RN,BSN [facility] Post Acute Care Coordinator [phone] [facility]) [phone]  (Toll free office)

## 2020-07-29 NOTE — Addendum Note (Signed)
Addended by: Ladean Raya on: 07/29/2020 12:06 PM   Modules accepted: Orders

## 2020-07-29 NOTE — Patient Outreach (Signed)
[facility] Post-Acute Care Coordinator follow up.   Telephone call made to [facility] who states they have Mrs. Steinhilber' referral for PT. States they were going to call to schedule visit. Writer made [facility] aware that daughter/DPR Aaryana Betke should be contacted to schedule at [phone].  Writer attempting to make referral in Epic for [facility] Care Management RNCM (central team).   Raiford Noble, MSN, RN,BSN [facility] Post Acute Care Coordinator [phone] [facility]) [phone]  (Toll free office)

## 2020-07-30 ENCOUNTER — Other Ambulatory Visit: Payer: Self-pay | Admitting: *Deleted

## 2020-07-30 NOTE — Patient Outreach (Signed)
[facility]) Care Management  07/30/2020  Gloria Rowland 04/01/1932 [id number]   Referral Date:  5/9 Referral Source: Post acute care coordinator Date of Admission: 4/4 from [facility] to [facility] Diagnosis: UTI/sepsois Date of Discharge: 5/5 Facility: [facility] Insurance: DCE/Traditional Medicare  Outreach attempt #1, unsuccessful to daughter Sabrine, requested contact person.  HIPAA compliant voice message left.  Plan: RN CM will send outreach letter and follow up within the next 3-4 business days.  Kemper Durie, [place], MSN [facility] Care Management  [facility] Manager [phone]

## 2020-07-31 DIAGNOSIS — N1831 Chronic kidney disease, stage 3a: Secondary | ICD-10-CM | POA: Diagnosis not present

## 2020-07-31 DIAGNOSIS — K219 Gastro-esophageal reflux disease without esophagitis: Secondary | ICD-10-CM | POA: Diagnosis not present

## 2020-07-31 DIAGNOSIS — I700 Atherosclerosis of aorta: Secondary | ICD-10-CM | POA: Diagnosis not present

## 2020-07-31 DIAGNOSIS — R1310 Dysphagia, unspecified: Secondary | ICD-10-CM | POA: Diagnosis not present

## 2020-07-31 DIAGNOSIS — R339 Retention of urine, unspecified: Secondary | ICD-10-CM | POA: Diagnosis not present

## 2020-07-31 DIAGNOSIS — Z951 Presence of aortocoronary bypass graft: Secondary | ICD-10-CM | POA: Diagnosis not present

## 2020-07-31 DIAGNOSIS — M19041 Primary osteoarthritis, right hand: Secondary | ICD-10-CM | POA: Diagnosis not present

## 2020-07-31 DIAGNOSIS — F329 Major depressive disorder, single episode, unspecified: Secondary | ICD-10-CM | POA: Diagnosis not present

## 2020-07-31 DIAGNOSIS — E785 Hyperlipidemia, unspecified: Secondary | ICD-10-CM | POA: Diagnosis not present

## 2020-07-31 DIAGNOSIS — Z7902 Long term (current) use of antithrombotics/antiplatelets: Secondary | ICD-10-CM | POA: Diagnosis not present

## 2020-07-31 DIAGNOSIS — G243 Spasmodic torticollis: Secondary | ICD-10-CM | POA: Diagnosis not present

## 2020-07-31 DIAGNOSIS — D631 Anemia in chronic kidney disease: Secondary | ICD-10-CM | POA: Diagnosis not present

## 2020-07-31 DIAGNOSIS — Z466 Encounter for fitting and adjustment of urinary device: Secondary | ICD-10-CM | POA: Diagnosis not present

## 2020-07-31 DIAGNOSIS — M5136 Other intervertebral disc degeneration, lumbar region: Secondary | ICD-10-CM | POA: Diagnosis not present

## 2020-07-31 DIAGNOSIS — M419 Scoliosis, unspecified: Secondary | ICD-10-CM | POA: Diagnosis not present

## 2020-07-31 DIAGNOSIS — F0390 Unspecified dementia without behavioral disturbance: Secondary | ICD-10-CM | POA: Diagnosis not present

## 2020-07-31 DIAGNOSIS — H9190 Unspecified hearing loss, unspecified ear: Secondary | ICD-10-CM | POA: Diagnosis not present

## 2020-07-31 DIAGNOSIS — L89151 Pressure ulcer of sacral region, stage 1: Secondary | ICD-10-CM | POA: Diagnosis not present

## 2020-07-31 DIAGNOSIS — I129 Hypertensive chronic kidney disease with stage 1 through stage 4 chronic kidney disease, or unspecified chronic kidney disease: Secondary | ICD-10-CM | POA: Diagnosis not present

## 2020-07-31 DIAGNOSIS — I252 Old myocardial infarction: Secondary | ICD-10-CM | POA: Diagnosis not present

## 2020-07-31 DIAGNOSIS — I2510 Atherosclerotic heart disease of native coronary artery without angina pectoris: Secondary | ICD-10-CM | POA: Diagnosis not present

## 2020-07-31 DIAGNOSIS — M47814 Spondylosis without myelopathy or radiculopathy, thoracic region: Secondary | ICD-10-CM | POA: Diagnosis not present

## 2020-07-31 DIAGNOSIS — I440 Atrioventricular block, first degree: Secondary | ICD-10-CM | POA: Diagnosis not present

## 2020-07-31 DIAGNOSIS — M85841 Other specified disorders of bone density and structure, right hand: Secondary | ICD-10-CM | POA: Diagnosis not present

## 2020-07-31 DIAGNOSIS — E43 Unspecified severe protein-calorie malnutrition: Secondary | ICD-10-CM | POA: Diagnosis not present

## 2020-08-02 DIAGNOSIS — I2510 Atherosclerotic heart disease of native coronary artery without angina pectoris: Secondary | ICD-10-CM | POA: Diagnosis not present

## 2020-08-02 DIAGNOSIS — N1831 Chronic kidney disease, stage 3a: Secondary | ICD-10-CM | POA: Diagnosis not present

## 2020-08-02 DIAGNOSIS — I129 Hypertensive chronic kidney disease with stage 1 through stage 4 chronic kidney disease, or unspecified chronic kidney disease: Secondary | ICD-10-CM | POA: Diagnosis not present

## 2020-08-02 DIAGNOSIS — F0390 Unspecified dementia without behavioral disturbance: Secondary | ICD-10-CM | POA: Diagnosis not present

## 2020-08-02 DIAGNOSIS — L89151 Pressure ulcer of sacral region, stage 1: Secondary | ICD-10-CM | POA: Diagnosis not present

## 2020-08-02 DIAGNOSIS — D631 Anemia in chronic kidney disease: Secondary | ICD-10-CM | POA: Diagnosis not present

## 2020-08-05 ENCOUNTER — Other Ambulatory Visit: Payer: Self-pay | Admitting: *Deleted

## 2020-08-05 ENCOUNTER — Encounter: Payer: Self-pay | Admitting: *Deleted

## 2020-08-05 NOTE — Patient Outreach (Signed)
[facility]) Care Management  [facility] Care Manager  08/05/2020   Gloria Rowland 1932/10/30 [id number]  Referral Date:  5/9 Referral Source: Post acute care coordinator Date of Admission: 4/4 from [facility] to [facility] Diagnosis: UTI/sepsois Date of Discharge: 5/5 Facility: [facility] Insurance: DCE/Traditional Medicare   Outreach attempt #2, successful to daughter Gloria Rowland. Identity verified.  This care manager introduced self and stated purpose of call.  [facility] care management services explained.    Social: Lives with daughter, report she helps with bathing, dressing, and other ADL's/IADL's.  Conditions: Per chart, has history of CAD, MI, GERD, HTN, Dementia, CABG, HLD, Urine retention with foley catheter remaining in place.  Medications: Reviewed with daughter, report taking all as instructed.    Encounter Medications:  Outpatient Encounter Medications as of 08/05/2020  Medication Sig  . acetaminophen (TYLENOL) 650 MG CR tablet Take 650 mg by mouth every 6 (six) hours as needed for pain.  . Amino Acids-Protein Hydrolys (FEEDING SUPPLEMENT, PRO-STAT SUGAR FREE 64,) LIQD Take 30 mLs by mouth 3 (three) times daily with meals.  Marland Kitchen amLODipine (NORVASC) 10 MG tablet Take 1 tablet (10 mg total) by mouth daily.  Marland Kitchen aspirin EC 81 MG tablet Take 81 mg by mouth daily.  . citalopram (CELEXA) 20 MG tablet Take 1 tablet (20 mg total) by mouth daily.  . clopidogrel (PLAVIX) 75 MG tablet Take 1 tablet (75 mg total) by mouth daily.  . diclofenac Sodium (VOLTAREN) 1 % GEL Apply 2 g topically 3 (three) times daily. Special Instructions: apply to right hand for pain and swelling  . feeding supplement (ENSURE ENLIVE / ENSURE PLUS) LIQD Take 237 mLs by mouth 2 (two) times daily between meals.  . isosorbide mononitrate (IMDUR) 30 MG 24 hr tablet Take 0.5 tablets (15 mg total) by mouth daily.  . memantine (NAMENDA) 10 MG tablet Take 1 tablet (10 mg total) by mouth 2 (two) times  daily.  . nitroGLYCERIN (NITROSTAT) 0.4 MG SL tablet Place 1 tablet (0.4 mg total) under the tongue every 5 (five) minutes as needed for chest pain.  . NON FORMULARY Diet: Dysphagia 3, NAS Diet  . pantoprazole (PROTONIX) 40 MG tablet Take 1 tablet (40 mg total) by mouth daily.  Marland Kitchen saccharomyces boulardii (FLORASTOR) 250 MG capsule Take 250 mg by mouth 2 (two) times daily. From 07/04/2020-08/03/2020. Then on 08/04/2020 start 250 mg by mouth daily  . Vitamin D, Ergocalciferol, (DRISDOL) 1.25 MG (50000 UNIT) CAPS capsule Take 1 capsule (50,000 Units total) by mouth every 7 (seven) days. On Wednesday  . ondansetron (ZOFRAN ODT) 4 MG disintegrating tablet Take 1 tablet (4 mg total) by mouth every 8 (eight) hours as needed for nausea or vomiting. (Patient not taking: Reported on 08/05/2020)  . polyethylene glycol (MIRALAX / GLYCOLAX) 17 g packet Take 17 g by mouth daily as needed for mild constipation. (Patient not taking: Reported on 08/05/2020)   No facility-administered encounter medications on file as of 08/05/2020.    Functional Status:  In your present state of health, do you have any difficulty performing the following activities: 06/21/2020 06/20/2020  Hearing? Y -  Vision? N -  Difficulty concentrating or making decisions? Malvin Johns  Walking or climbing stairs? Y Y  Dressing or bathing? Y Y  Doing errands, shopping? Y Y  Some recent data might be hidden    Fall/Depression Screening: Fall Risk  08/05/2020  Falls in the past year? 0  Number falls in past yr: 0  Injury with Fall? 0  Risk for fall due to : Impaired balance/gait  Follow up Education provided;Falls evaluation completed   PHQ 2/9 Scores 08/05/2020  Exception Documentation Medical reason    Assessment:  Goals Addressed            This Visit's Progress   . [facility] - Make and Keep All Appointments       Timeframe:  Short-Term Goal Priority:  High Start Date:           5/16                  Expected End Date:  6/16                      Follow Up Date 5/23  Barriers: Psychosocial - Dementia    - call to cancel if needed - keep a calendar with prescription refill dates    Why is this important?    Part of staying healthy is seeing the doctor for follow-up care.   If you forget your appointments, there are some things you can do to stay on track.    Notes:   5/16 - Appointment with PCP on 5/25, will call to schedule visit with urology    . [facility] - Matintain My Quality of Life       Timeframe:  Long-Range Goal Priority:  Medium Start Date:         5/16                    Expected End Date:  8/16                     Follow Up Date 5/23  Barriers: Health Behaviors Psychosocial - Dementia    - discuss my treatment options with the doctor or nurse - do something different, like talking to a new person or going to a new place, every day - spend time outdoors at least 3 times a week    Why is this important?    Having a long-term illness can be scary.   It can also be stressful for you and your caregiver.   These steps may help.    Notes:   5/16 - Discussed member having chronic foley, encouraged to confirm it is changed every 4 weeks.  Confirmed member active with [name] for PT and OT       Plan:  Follow-up:  Patient agrees to Care Plan and Follow-up. Will send education regarding UTI recovery.  Will notify PCP of [facility] involvement.  Will follow up within the next week.  Kemper Durie, [place], MSN [facility] Care Management  [facility] Manager [phone]

## 2020-08-05 NOTE — Patient Instructions (Signed)
Urinary Tract Infection, Adult A urinary tract infection (UTI) is an infection of any part of the urinary tract. The urinary tract includes:  The kidneys.  The ureters.  The bladder.  The urethra. These organs make, store, and get rid of pee (urine) in the body. What are the causes? This infection is caused by germs (bacteria) in your genital area. These germs grow and cause swelling (inflammation) of your urinary tract. What increases the risk? The following factors may make you more likely to develop this condition:  Using a small, thin tube (catheter) to drain pee.  Not being able to control when you pee or poop (incontinence).  Being female. If you are female, these things can increase the risk: ? Using these methods to prevent pregnancy:  A medicine that kills sperm (spermicide).  A device that blocks sperm (diaphragm). ? Having low levels of a female hormone (estrogen). ? Being pregnant. You are more likely to develop this condition if:  You have genes that add to your risk.  You are sexually active.  You take antibiotic medicines.  You have trouble peeing because of: ? A prostate that is bigger than normal, if you are female. ? A blockage in the part of your body that drains pee from the bladder. ? A kidney stone. ? A nerve condition that affects your bladder. ? Not getting enough to drink. ? Not peeing often enough.  You have other conditions, such as: ? Diabetes. ? A weak disease-fighting system (immune system). ? Sickle cell disease. ? Gout. ? Injury of the spine. What are the signs or symptoms? Symptoms of this condition include:  Needing to pee right away.  Peeing small amounts often.  Pain or burning when peeing.  Blood in the pee.  Pee that smells bad or not like normal.  Trouble peeing.  Pee that is cloudy.  Fluid coming from the vagina, if you are female.  Pain in the belly or lower back. Other symptoms include:  Vomiting.  Not  feeling hungry.  Feeling mixed up (confused). This may be the first symptom in older adults.  Being tired and grouchy (irritable).  A fever.  Watery poop (diarrhea). How is this treated?  Taking antibiotic medicine.  Taking other medicines.  Drinking enough water. In some cases, you may need to see a specialist. Follow these instructions at home: Medicines  Take over-the-counter and prescription medicines only as told by your doctor.  If you were prescribed an antibiotic medicine, take it as told by your doctor. Do not stop taking it even if you start to feel better. General instructions  Make sure you: ? Pee until your bladder is empty. ? Do not hold pee for a long time. ? Empty your bladder after sex. ? Wipe from front to back after peeing or pooping if you are a female. Use each tissue one time when you wipe.  Drink enough fluid to keep your pee pale yellow.  Keep all follow-up visits.   Contact a doctor if:  You do not get better after 1-2 days.  Your symptoms go away and then come back. Get help right away if:  You have very bad back pain.  You have very bad pain in your lower belly.  You have a fever.  You have chills.  You feeling like you will vomit or you vomit. Summary  A urinary tract infection (UTI) is an infection of any part of the urinary tract.  This condition is caused by   germs in your genital area.  There are many risk factors for a UTI.  Treatment includes antibiotic medicines.  Drink enough fluid to keep your pee pale yellow. This information is not intended to replace advice given to you by your health care provider. Make sure you discuss any questions you have with your health care provider. Document Revised: 10/20/2019 Document Reviewed: 10/20/2019 Elsevier Patient Education  2021 Elsevier Inc.  

## 2020-08-08 DIAGNOSIS — L89151 Pressure ulcer of sacral region, stage 1: Secondary | ICD-10-CM | POA: Diagnosis not present

## 2020-08-08 DIAGNOSIS — F0390 Unspecified dementia without behavioral disturbance: Secondary | ICD-10-CM | POA: Diagnosis not present

## 2020-08-08 DIAGNOSIS — I2510 Atherosclerotic heart disease of native coronary artery without angina pectoris: Secondary | ICD-10-CM | POA: Diagnosis not present

## 2020-08-08 DIAGNOSIS — N1831 Chronic kidney disease, stage 3a: Secondary | ICD-10-CM | POA: Diagnosis not present

## 2020-08-08 DIAGNOSIS — I129 Hypertensive chronic kidney disease with stage 1 through stage 4 chronic kidney disease, or unspecified chronic kidney disease: Secondary | ICD-10-CM | POA: Diagnosis not present

## 2020-08-08 DIAGNOSIS — D631 Anemia in chronic kidney disease: Secondary | ICD-10-CM | POA: Diagnosis not present

## 2020-08-09 DIAGNOSIS — F0390 Unspecified dementia without behavioral disturbance: Secondary | ICD-10-CM | POA: Diagnosis not present

## 2020-08-09 DIAGNOSIS — D631 Anemia in chronic kidney disease: Secondary | ICD-10-CM | POA: Diagnosis not present

## 2020-08-09 DIAGNOSIS — I2510 Atherosclerotic heart disease of native coronary artery without angina pectoris: Secondary | ICD-10-CM | POA: Diagnosis not present

## 2020-08-09 DIAGNOSIS — N1831 Chronic kidney disease, stage 3a: Secondary | ICD-10-CM | POA: Diagnosis not present

## 2020-08-09 DIAGNOSIS — L89151 Pressure ulcer of sacral region, stage 1: Secondary | ICD-10-CM | POA: Diagnosis not present

## 2020-08-09 DIAGNOSIS — I129 Hypertensive chronic kidney disease with stage 1 through stage 4 chronic kidney disease, or unspecified chronic kidney disease: Secondary | ICD-10-CM | POA: Diagnosis not present

## 2020-08-12 ENCOUNTER — Other Ambulatory Visit: Payer: Self-pay | Admitting: *Deleted

## 2020-08-12 NOTE — Patient Outreach (Signed)
[facility]) Care Management  08/12/2020  Gloria Rowland 02/12/1933 [id number]   Weekly transition of care call placed to member's daughter.  State member is progressing well, no complications noted.  Continue to monitor for fever and signs of recurring infections. Denies any urgent concerns, encouraged to contact this care manager with questions.  Agrees to follow up within the next week.  Goals Addressed            This Visit's Progress   . [facility] - Make and Keep All Appointments   On track    Timeframe:  Short-Term Goal Priority:  High Start Date:           5/16                  Expected End Date:  6/16                      Barriers: Psychosocial - Dementia    - call to cancel if needed - keep a calendar with prescription refill dates    Why is this important?    Part of staying healthy is seeing the doctor for follow-up care.   If you forget your appointments, there are some things you can do to stay on track.    Notes:   5/16 - Appointment with PCP on 5/25, will call to schedule visit with urology  523 - Reminded of appointment this week, will try to secure appointment with new urologist in [place]    . [facility] - Matintain My Quality of Life   On track    Timeframe:  Long-Range Goal Priority:  Medium Start Date:         5/16                    Expected End Date:  8/16                      Barriers: Health Behaviors Psychosocial - Dementia    - discuss my treatment options with the doctor or nurse - do something different, like talking to a new person or going to a new place, every day - spend time outdoors at least 3 times a week    Why is this important?    Having a long-term illness can be scary.   It can also be stressful for you and your caregiver.   These steps may help.    Notes:   5/16 - Discussed member having chronic foley, encouraged to confirm it is changed every 4 weeks.  Confirmed member active with [facility] for PT and  OT  5/23 - [name] remains active, will start changing catheter once they have the order from MD      Kemper Durie, RN, MSN [facility] Care Management  [facility] Manager [phone]

## 2020-08-13 ENCOUNTER — Other Ambulatory Visit: Payer: Self-pay | Admitting: *Deleted

## 2020-08-13 NOTE — Patient Outreach (Signed)
[facility]) Care Management  08/13/2020  VETRA SHINALL 1932-03-27 [id number]   Voice message received from [facility] daughter stating [name] still has not received orders to change member's catheter.  Call placed to PCP office, notified that [name] would need to send a request to the office.  Call placed to [facility], spoke to Maralyn Sago, notified that they spoke to [facility] daughter and suggested a call be placed to her urologist to request this order rather than her PCP.  Call placed to [facility], order will be faxed to [facility].  Daughter notified that [name] is receiving orders and will contact her directly to set up a nursing visit.  Will follow up with daughter within the next week as planned.  Kemper Durie, [place], MSN [facility] Care Management  [facility] Manager [phone]

## 2020-08-14 DIAGNOSIS — I129 Hypertensive chronic kidney disease with stage 1 through stage 4 chronic kidney disease, or unspecified chronic kidney disease: Secondary | ICD-10-CM | POA: Diagnosis not present

## 2020-08-14 DIAGNOSIS — N1831 Chronic kidney disease, stage 3a: Secondary | ICD-10-CM | POA: Diagnosis not present

## 2020-08-14 DIAGNOSIS — F0390 Unspecified dementia without behavioral disturbance: Secondary | ICD-10-CM | POA: Diagnosis not present

## 2020-08-14 DIAGNOSIS — D631 Anemia in chronic kidney disease: Secondary | ICD-10-CM | POA: Diagnosis not present

## 2020-08-14 DIAGNOSIS — L89151 Pressure ulcer of sacral region, stage 1: Secondary | ICD-10-CM | POA: Diagnosis not present

## 2020-08-14 DIAGNOSIS — I2510 Atherosclerotic heart disease of native coronary artery without angina pectoris: Secondary | ICD-10-CM | POA: Diagnosis not present

## 2020-08-15 DIAGNOSIS — L89151 Pressure ulcer of sacral region, stage 1: Secondary | ICD-10-CM | POA: Diagnosis not present

## 2020-08-15 DIAGNOSIS — I25119 Atherosclerotic heart disease of native coronary artery with unspecified angina pectoris: Secondary | ICD-10-CM | POA: Diagnosis not present

## 2020-08-15 DIAGNOSIS — E43 Unspecified severe protein-calorie malnutrition: Secondary | ICD-10-CM | POA: Diagnosis not present

## 2020-08-15 DIAGNOSIS — I129 Hypertensive chronic kidney disease with stage 1 through stage 4 chronic kidney disease, or unspecified chronic kidney disease: Secondary | ICD-10-CM | POA: Diagnosis not present

## 2020-08-15 DIAGNOSIS — R339 Retention of urine, unspecified: Secondary | ICD-10-CM | POA: Diagnosis not present

## 2020-08-15 DIAGNOSIS — D631 Anemia in chronic kidney disease: Secondary | ICD-10-CM | POA: Diagnosis not present

## 2020-08-15 DIAGNOSIS — Z960 Presence of urogenital implants: Secondary | ICD-10-CM | POA: Diagnosis not present

## 2020-08-15 DIAGNOSIS — Z515 Encounter for palliative care: Secondary | ICD-10-CM | POA: Diagnosis not present

## 2020-08-15 DIAGNOSIS — G3184 Mild cognitive impairment, so stated: Secondary | ICD-10-CM | POA: Diagnosis not present

## 2020-08-15 DIAGNOSIS — N1831 Chronic kidney disease, stage 3a: Secondary | ICD-10-CM | POA: Diagnosis not present

## 2020-08-15 DIAGNOSIS — F0390 Unspecified dementia without behavioral disturbance: Secondary | ICD-10-CM | POA: Diagnosis not present

## 2020-08-15 DIAGNOSIS — I2510 Atherosclerotic heart disease of native coronary artery without angina pectoris: Secondary | ICD-10-CM | POA: Diagnosis not present

## 2020-08-16 DIAGNOSIS — I129 Hypertensive chronic kidney disease with stage 1 through stage 4 chronic kidney disease, or unspecified chronic kidney disease: Secondary | ICD-10-CM | POA: Diagnosis not present

## 2020-08-16 DIAGNOSIS — L89151 Pressure ulcer of sacral region, stage 1: Secondary | ICD-10-CM | POA: Diagnosis not present

## 2020-08-16 DIAGNOSIS — I2510 Atherosclerotic heart disease of native coronary artery without angina pectoris: Secondary | ICD-10-CM | POA: Diagnosis not present

## 2020-08-16 DIAGNOSIS — F0390 Unspecified dementia without behavioral disturbance: Secondary | ICD-10-CM | POA: Diagnosis not present

## 2020-08-16 DIAGNOSIS — N1831 Chronic kidney disease, stage 3a: Secondary | ICD-10-CM | POA: Diagnosis not present

## 2020-08-16 DIAGNOSIS — D631 Anemia in chronic kidney disease: Secondary | ICD-10-CM | POA: Diagnosis not present

## 2020-08-19 DIAGNOSIS — I25119 Atherosclerotic heart disease of native coronary artery with unspecified angina pectoris: Secondary | ICD-10-CM | POA: Diagnosis not present

## 2020-08-19 DIAGNOSIS — E785 Hyperlipidemia, unspecified: Secondary | ICD-10-CM | POA: Diagnosis not present

## 2020-08-19 DIAGNOSIS — N390 Urinary tract infection, site not specified: Secondary | ICD-10-CM | POA: Diagnosis not present

## 2020-08-19 DIAGNOSIS — I10 Essential (primary) hypertension: Secondary | ICD-10-CM | POA: Diagnosis not present

## 2020-08-19 DIAGNOSIS — F331 Major depressive disorder, recurrent, moderate: Secondary | ICD-10-CM | POA: Diagnosis not present

## 2020-08-19 DIAGNOSIS — I214 Non-ST elevation (NSTEMI) myocardial infarction: Secondary | ICD-10-CM | POA: Diagnosis not present

## 2020-08-19 DIAGNOSIS — E43 Unspecified severe protein-calorie malnutrition: Secondary | ICD-10-CM | POA: Diagnosis not present

## 2020-08-19 DIAGNOSIS — N1831 Chronic kidney disease, stage 3a: Secondary | ICD-10-CM | POA: Diagnosis not present

## 2020-08-19 DIAGNOSIS — R339 Retention of urine, unspecified: Secondary | ICD-10-CM | POA: Diagnosis not present

## 2020-08-19 DIAGNOSIS — E782 Mixed hyperlipidemia: Secondary | ICD-10-CM | POA: Diagnosis not present

## 2020-08-19 DIAGNOSIS — D649 Anemia, unspecified: Secondary | ICD-10-CM | POA: Diagnosis not present

## 2020-08-19 DIAGNOSIS — F419 Anxiety disorder, unspecified: Secondary | ICD-10-CM | POA: Diagnosis not present

## 2020-08-19 DIAGNOSIS — M87059 Idiopathic aseptic necrosis of unspecified femur: Secondary | ICD-10-CM | POA: Diagnosis not present

## 2020-08-19 DIAGNOSIS — I2510 Atherosclerotic heart disease of native coronary artery without angina pectoris: Secondary | ICD-10-CM | POA: Diagnosis not present

## 2020-08-19 DIAGNOSIS — F0390 Unspecified dementia without behavioral disturbance: Secondary | ICD-10-CM | POA: Diagnosis not present

## 2020-08-19 DIAGNOSIS — Z515 Encounter for palliative care: Secondary | ICD-10-CM | POA: Diagnosis not present

## 2020-08-19 DIAGNOSIS — R531 Weakness: Secondary | ICD-10-CM | POA: Diagnosis not present

## 2020-08-19 DIAGNOSIS — E46 Unspecified protein-calorie malnutrition: Secondary | ICD-10-CM | POA: Diagnosis not present

## 2020-08-19 DIAGNOSIS — Z960 Presence of urogenital implants: Secondary | ICD-10-CM | POA: Diagnosis not present

## 2020-08-19 DIAGNOSIS — G3184 Mild cognitive impairment, so stated: Secondary | ICD-10-CM | POA: Diagnosis not present

## 2020-08-20 ENCOUNTER — Ambulatory Visit: Payer: Self-pay | Admitting: *Deleted

## 2020-08-20 DIAGNOSIS — N1831 Chronic kidney disease, stage 3a: Secondary | ICD-10-CM | POA: Diagnosis not present

## 2020-08-20 DIAGNOSIS — I10 Essential (primary) hypertension: Secondary | ICD-10-CM | POA: Diagnosis not present

## 2020-08-20 DIAGNOSIS — R339 Retention of urine, unspecified: Secondary | ICD-10-CM | POA: Diagnosis not present

## 2020-08-20 DIAGNOSIS — E46 Unspecified protein-calorie malnutrition: Secondary | ICD-10-CM | POA: Diagnosis not present

## 2020-08-20 DIAGNOSIS — E785 Hyperlipidemia, unspecified: Secondary | ICD-10-CM | POA: Diagnosis not present

## 2020-08-20 DIAGNOSIS — I2510 Atherosclerotic heart disease of native coronary artery without angina pectoris: Secondary | ICD-10-CM | POA: Diagnosis not present

## 2020-08-21 ENCOUNTER — Other Ambulatory Visit: Payer: Self-pay | Admitting: *Deleted

## 2020-08-21 DIAGNOSIS — I10 Essential (primary) hypertension: Secondary | ICD-10-CM | POA: Diagnosis not present

## 2020-08-21 DIAGNOSIS — F331 Major depressive disorder, recurrent, moderate: Secondary | ICD-10-CM | POA: Diagnosis not present

## 2020-08-21 DIAGNOSIS — D649 Anemia, unspecified: Secondary | ICD-10-CM | POA: Diagnosis not present

## 2020-08-21 DIAGNOSIS — F419 Anxiety disorder, unspecified: Secondary | ICD-10-CM | POA: Diagnosis not present

## 2020-08-21 DIAGNOSIS — N1831 Chronic kidney disease, stage 3a: Secondary | ICD-10-CM | POA: Diagnosis not present

## 2020-08-21 DIAGNOSIS — E785 Hyperlipidemia, unspecified: Secondary | ICD-10-CM | POA: Diagnosis not present

## 2020-08-21 DIAGNOSIS — I2510 Atherosclerotic heart disease of native coronary artery without angina pectoris: Secondary | ICD-10-CM | POA: Diagnosis not present

## 2020-08-21 DIAGNOSIS — F0390 Unspecified dementia without behavioral disturbance: Secondary | ICD-10-CM | POA: Diagnosis not present

## 2020-08-21 DIAGNOSIS — R339 Retention of urine, unspecified: Secondary | ICD-10-CM | POA: Diagnosis not present

## 2020-08-21 DIAGNOSIS — N390 Urinary tract infection, site not specified: Secondary | ICD-10-CM | POA: Diagnosis not present

## 2020-08-21 DIAGNOSIS — R531 Weakness: Secondary | ICD-10-CM | POA: Diagnosis not present

## 2020-08-21 DIAGNOSIS — E46 Unspecified protein-calorie malnutrition: Secondary | ICD-10-CM | POA: Diagnosis not present

## 2020-08-21 DIAGNOSIS — I214 Non-ST elevation (NSTEMI) myocardial infarction: Secondary | ICD-10-CM | POA: Diagnosis not present

## 2020-08-21 NOTE — Patient Outreach (Signed)
[facility]) Care Management  08/21/2020  Gloria Rowland [birth year] [id number]   Weekly transition of care call placed to member's daughter.  She report member has developed another UTI but it was caught in time, now taking antibiotics.  Awaiting call back from hospice nurse for medication for nausea, otherwise member is doing well.  Denies any urgent concerns, encouraged to contact this care manager with questions.  Agrees to follow up within the next week.  Goals Addressed            This Visit's Progress   . [facility] - Make and Keep All Appointments   On track    Timeframe:  Short-Term Goal Priority:  High Start Date:           5/16                  Expected End Date:  6/16                      Barriers: Psychosocial - Dementia    - call to cancel if needed - keep a calendar with prescription refill dates    Why is this important?    Part of staying healthy is seeing the doctor for follow-up care.   If you forget your appointments, there are some things you can do to stay on track.    Notes:   5/16 - Appointment with PCP on 5/25, will call to schedule visit with urology  523 - Reminded of appointment this week, will try to secure appointment with new urologist in [facility]  6/1 - Daughter continue to work on new urology appointment    . [facility] - Matintain My Quality of Life   On track    Timeframe:  Long-Range Goal Priority:  Medium Start Date:         5/16                    Expected End Date:  8/16                      Barriers: Health Behaviors Psychosocial - Dementia    - discuss my treatment options with the doctor or nurse - do something different, like talking to a new person or going to a new place, every day - spend time outdoors at least 3 times a week    Why is this important?    Having a long-term illness can be scary.   It can also be stressful for you and your caregiver.   These steps may help.    Notes:   5/16 -  Discussed member having chronic foley, encouraged to confirm it is changed every 4 weeks.  Confirmed member active with [facility] for PT and OT  5/23 - [name] remains active, will start changing catheter once they have the order from MD  6/1- Confirms [facility] received orders to change catheter.  Daughter mentioned member being active with hospice.  Call placed to [facility] to confirm, no answer, will make another attempt in the next week.       Kemper Durie, [place], MSN [facility] Care Management  [facility] Manager [phone]

## 2020-08-23 DIAGNOSIS — N1831 Chronic kidney disease, stage 3a: Secondary | ICD-10-CM | POA: Diagnosis not present

## 2020-08-23 DIAGNOSIS — E785 Hyperlipidemia, unspecified: Secondary | ICD-10-CM | POA: Diagnosis not present

## 2020-08-23 DIAGNOSIS — I2510 Atherosclerotic heart disease of native coronary artery without angina pectoris: Secondary | ICD-10-CM | POA: Diagnosis not present

## 2020-08-23 DIAGNOSIS — I10 Essential (primary) hypertension: Secondary | ICD-10-CM | POA: Diagnosis not present

## 2020-08-23 DIAGNOSIS — R339 Retention of urine, unspecified: Secondary | ICD-10-CM | POA: Diagnosis not present

## 2020-08-23 DIAGNOSIS — E46 Unspecified protein-calorie malnutrition: Secondary | ICD-10-CM | POA: Diagnosis not present

## 2020-08-26 DIAGNOSIS — E785 Hyperlipidemia, unspecified: Secondary | ICD-10-CM | POA: Diagnosis not present

## 2020-08-26 DIAGNOSIS — I10 Essential (primary) hypertension: Secondary | ICD-10-CM | POA: Diagnosis not present

## 2020-08-26 DIAGNOSIS — R339 Retention of urine, unspecified: Secondary | ICD-10-CM | POA: Diagnosis not present

## 2020-08-26 DIAGNOSIS — E46 Unspecified protein-calorie malnutrition: Secondary | ICD-10-CM | POA: Diagnosis not present

## 2020-08-26 DIAGNOSIS — N1831 Chronic kidney disease, stage 3a: Secondary | ICD-10-CM | POA: Diagnosis not present

## 2020-08-26 DIAGNOSIS — I2510 Atherosclerotic heart disease of native coronary artery without angina pectoris: Secondary | ICD-10-CM | POA: Diagnosis not present

## 2020-08-28 DIAGNOSIS — N1831 Chronic kidney disease, stage 3a: Secondary | ICD-10-CM | POA: Diagnosis not present

## 2020-08-28 DIAGNOSIS — E46 Unspecified protein-calorie malnutrition: Secondary | ICD-10-CM | POA: Diagnosis not present

## 2020-08-28 DIAGNOSIS — I10 Essential (primary) hypertension: Secondary | ICD-10-CM | POA: Diagnosis not present

## 2020-08-28 DIAGNOSIS — I2510 Atherosclerotic heart disease of native coronary artery without angina pectoris: Secondary | ICD-10-CM | POA: Diagnosis not present

## 2020-08-28 DIAGNOSIS — E785 Hyperlipidemia, unspecified: Secondary | ICD-10-CM | POA: Diagnosis not present

## 2020-08-28 DIAGNOSIS — R339 Retention of urine, unspecified: Secondary | ICD-10-CM | POA: Diagnosis not present

## 2020-08-29 ENCOUNTER — Other Ambulatory Visit: Payer: Self-pay | Admitting: *Deleted

## 2020-08-29 NOTE — Patient Outreach (Signed)
[facility]) Care Management  08/29/2020  Gloria Rowland 1933-02-15 [id number]   Weekly transition of care call placed to member's daughter.  State member has not been doing well over the past week, has stopped eating and is sleeping more.  She will follow up with hospice/palliative care for assessment.  Denies any urgent concerns, encouraged to contact this care manager with questions.  Agrees to follow up within the next week.   Goals Addressed             This Visit's Progress    [facility] - Make and Keep All Appointments   On track    Timeframe:  Short-Term Goal Priority:  High Start Date:           5/16                  Expected End Date:  6/16                      Barriers: Psychosocial - Dementia    - call to cancel if needed - keep a calendar with prescription refill dates    Why is this important?   Part of staying healthy is seeing the doctor for follow-up care.  If you forget your appointments, there are some things you can do to stay on track.    Notes:   5/16 - Appointment with PCP on 5/25, will call to schedule visit with urology  523 - Reminded of appointment this week, will try to secure appointment with new urologist in [facility]  6/1 - Daughter continue to work on new urology appointment  6/9 - Member/daughter has kept appointments with home health nurse and palliative care nurse      [facility] - Matintain My Quality of Life   Not on track    Timeframe:  Long-Range Goal Priority:  Medium Start Date:         5/16                    Expected End Date:  8/16                      Barriers: Health Behaviors Psychosocial - Dementia    - discuss my treatment options with the doctor or nurse - do something different, like talking to a new person or going to a new place, every day - spend time outdoors at least 3 times a week    Why is this important?   Having a long-term illness can be scary.  It can also be stressful for you and your  caregiver.  These steps may help.    Notes:   5/16 - Discussed member having chronic foley, encouraged to confirm it is changed every 4 weeks.  Confirmed member active with [facility] for PT and OT  5/23 - [name] remains active, will start changing catheter once they have the order from MD  6/1- Confirms [facility] received orders to change catheter.  Daughter mentioned member being active with hospice.  Call placed to [facility] to confirm, no answer, will make another attempt in the next week.  6/9 - Per daughter, member's condition continues to decline.  She has had visits from palliative care nurse, will possibly transition to hospice.  Daughter will call nurse to visit member for reassessment.        Kemper Durie, [place], MSN [facility] Care Management  [facility] Manager [phone]

## 2020-08-30 DIAGNOSIS — E785 Hyperlipidemia, unspecified: Secondary | ICD-10-CM | POA: Diagnosis not present

## 2020-08-30 DIAGNOSIS — I2510 Atherosclerotic heart disease of native coronary artery without angina pectoris: Secondary | ICD-10-CM | POA: Diagnosis not present

## 2020-08-30 DIAGNOSIS — N1831 Chronic kidney disease, stage 3a: Secondary | ICD-10-CM | POA: Diagnosis not present

## 2020-08-30 DIAGNOSIS — E46 Unspecified protein-calorie malnutrition: Secondary | ICD-10-CM | POA: Diagnosis not present

## 2020-08-30 DIAGNOSIS — I10 Essential (primary) hypertension: Secondary | ICD-10-CM | POA: Diagnosis not present

## 2020-08-30 DIAGNOSIS — R339 Retention of urine, unspecified: Secondary | ICD-10-CM | POA: Diagnosis not present

## 2020-09-02 DIAGNOSIS — I2510 Atherosclerotic heart disease of native coronary artery without angina pectoris: Secondary | ICD-10-CM | POA: Diagnosis not present

## 2020-09-02 DIAGNOSIS — I10 Essential (primary) hypertension: Secondary | ICD-10-CM | POA: Diagnosis not present

## 2020-09-02 DIAGNOSIS — N1831 Chronic kidney disease, stage 3a: Secondary | ICD-10-CM | POA: Diagnosis not present

## 2020-09-02 DIAGNOSIS — E785 Hyperlipidemia, unspecified: Secondary | ICD-10-CM | POA: Diagnosis not present

## 2020-09-02 DIAGNOSIS — E46 Unspecified protein-calorie malnutrition: Secondary | ICD-10-CM | POA: Diagnosis not present

## 2020-09-02 DIAGNOSIS — R339 Retention of urine, unspecified: Secondary | ICD-10-CM | POA: Diagnosis not present

## 2020-09-04 DIAGNOSIS — E785 Hyperlipidemia, unspecified: Secondary | ICD-10-CM | POA: Diagnosis not present

## 2020-09-04 DIAGNOSIS — E46 Unspecified protein-calorie malnutrition: Secondary | ICD-10-CM | POA: Diagnosis not present

## 2020-09-04 DIAGNOSIS — I2510 Atherosclerotic heart disease of native coronary artery without angina pectoris: Secondary | ICD-10-CM | POA: Diagnosis not present

## 2020-09-04 DIAGNOSIS — I10 Essential (primary) hypertension: Secondary | ICD-10-CM | POA: Diagnosis not present

## 2020-09-04 DIAGNOSIS — N1831 Chronic kidney disease, stage 3a: Secondary | ICD-10-CM | POA: Diagnosis not present

## 2020-09-04 DIAGNOSIS — R339 Retention of urine, unspecified: Secondary | ICD-10-CM | POA: Diagnosis not present

## 2020-09-05 DIAGNOSIS — N1831 Chronic kidney disease, stage 3a: Secondary | ICD-10-CM | POA: Diagnosis not present

## 2020-09-05 DIAGNOSIS — I10 Essential (primary) hypertension: Secondary | ICD-10-CM | POA: Diagnosis not present

## 2020-09-05 DIAGNOSIS — R339 Retention of urine, unspecified: Secondary | ICD-10-CM | POA: Diagnosis not present

## 2020-09-05 DIAGNOSIS — E46 Unspecified protein-calorie malnutrition: Secondary | ICD-10-CM | POA: Diagnosis not present

## 2020-09-05 DIAGNOSIS — E785 Hyperlipidemia, unspecified: Secondary | ICD-10-CM | POA: Diagnosis not present

## 2020-09-05 DIAGNOSIS — I2510 Atherosclerotic heart disease of native coronary artery without angina pectoris: Secondary | ICD-10-CM | POA: Diagnosis not present

## 2020-09-06 ENCOUNTER — Other Ambulatory Visit: Payer: Self-pay | Admitting: *Deleted

## 2020-09-06 DIAGNOSIS — I2510 Atherosclerotic heart disease of native coronary artery without angina pectoris: Secondary | ICD-10-CM | POA: Diagnosis not present

## 2020-09-06 DIAGNOSIS — I10 Essential (primary) hypertension: Secondary | ICD-10-CM | POA: Diagnosis not present

## 2020-09-06 DIAGNOSIS — R339 Retention of urine, unspecified: Secondary | ICD-10-CM | POA: Diagnosis not present

## 2020-09-06 DIAGNOSIS — N1831 Chronic kidney disease, stage 3a: Secondary | ICD-10-CM | POA: Diagnosis not present

## 2020-09-06 DIAGNOSIS — E46 Unspecified protein-calorie malnutrition: Secondary | ICD-10-CM | POA: Diagnosis not present

## 2020-09-06 DIAGNOSIS — E785 Hyperlipidemia, unspecified: Secondary | ICD-10-CM | POA: Diagnosis not present

## 2020-09-06 NOTE — Patient Outreach (Signed)
[facility]) Care Management  09/06/2020  RENIKA SHIFLET 02/21/1933 [id number]   Weekly transition of care call placed to member's daughter, no answer, HIPAA compliant voice message left.  Will follow up within the next 3-4 business days.  Kemper Durie, [place], MSN [facility] Care Management  [facility] Manager [phone]

## 2020-09-09 DIAGNOSIS — E46 Unspecified protein-calorie malnutrition: Secondary | ICD-10-CM | POA: Diagnosis not present

## 2020-09-09 DIAGNOSIS — I10 Essential (primary) hypertension: Secondary | ICD-10-CM | POA: Diagnosis not present

## 2020-09-09 DIAGNOSIS — I2510 Atherosclerotic heart disease of native coronary artery without angina pectoris: Secondary | ICD-10-CM | POA: Diagnosis not present

## 2020-09-09 DIAGNOSIS — N1831 Chronic kidney disease, stage 3a: Secondary | ICD-10-CM | POA: Diagnosis not present

## 2020-09-09 DIAGNOSIS — E785 Hyperlipidemia, unspecified: Secondary | ICD-10-CM | POA: Diagnosis not present

## 2020-09-09 DIAGNOSIS — R339 Retention of urine, unspecified: Secondary | ICD-10-CM | POA: Diagnosis not present

## 2020-09-10 DIAGNOSIS — E785 Hyperlipidemia, unspecified: Secondary | ICD-10-CM | POA: Diagnosis not present

## 2020-09-10 DIAGNOSIS — I10 Essential (primary) hypertension: Secondary | ICD-10-CM | POA: Diagnosis not present

## 2020-09-10 DIAGNOSIS — R339 Retention of urine, unspecified: Secondary | ICD-10-CM | POA: Diagnosis not present

## 2020-09-10 DIAGNOSIS — N1831 Chronic kidney disease, stage 3a: Secondary | ICD-10-CM | POA: Diagnosis not present

## 2020-09-10 DIAGNOSIS — I2510 Atherosclerotic heart disease of native coronary artery without angina pectoris: Secondary | ICD-10-CM | POA: Diagnosis not present

## 2020-09-10 DIAGNOSIS — E46 Unspecified protein-calorie malnutrition: Secondary | ICD-10-CM | POA: Diagnosis not present

## 2020-09-11 ENCOUNTER — Other Ambulatory Visit: Payer: Self-pay | Admitting: *Deleted

## 2020-09-11 DIAGNOSIS — R339 Retention of urine, unspecified: Secondary | ICD-10-CM | POA: Diagnosis not present

## 2020-09-11 DIAGNOSIS — E46 Unspecified protein-calorie malnutrition: Secondary | ICD-10-CM | POA: Diagnosis not present

## 2020-09-11 DIAGNOSIS — E785 Hyperlipidemia, unspecified: Secondary | ICD-10-CM | POA: Diagnosis not present

## 2020-09-11 DIAGNOSIS — I10 Essential (primary) hypertension: Secondary | ICD-10-CM | POA: Diagnosis not present

## 2020-09-11 DIAGNOSIS — I2510 Atherosclerotic heart disease of native coronary artery without angina pectoris: Secondary | ICD-10-CM | POA: Diagnosis not present

## 2020-09-11 DIAGNOSIS — N1831 Chronic kidney disease, stage 3a: Secondary | ICD-10-CM | POA: Diagnosis not present

## 2020-09-11 NOTE — Patient Outreach (Addendum)
[facility] Customer service manager [facility]) Care Management  09/11/2020  Gloria Rowland 29-Dec-1932 [id number]   Outreach attempt #2 placed to daughter.  Listed mobile number states line is disconnected, HIPAA compliant voice message left on listed home number.  Will send outreach letter and follow up within the next 3-4 business days.    Update:  Incoming call received back from daughter.  State member remains with hospice, expecting death within the next couple days.  Denies any needs at this time, will close case.   Kemper Durie, [place], MSN [facility] Care Management  [facility] Manager [phone]

## 2020-09-12 DIAGNOSIS — I2510 Atherosclerotic heart disease of native coronary artery without angina pectoris: Secondary | ICD-10-CM | POA: Diagnosis not present

## 2020-09-12 DIAGNOSIS — E785 Hyperlipidemia, unspecified: Secondary | ICD-10-CM | POA: Diagnosis not present

## 2020-09-12 DIAGNOSIS — I10 Essential (primary) hypertension: Secondary | ICD-10-CM | POA: Diagnosis not present

## 2020-09-12 DIAGNOSIS — E46 Unspecified protein-calorie malnutrition: Secondary | ICD-10-CM | POA: Diagnosis not present

## 2020-09-12 DIAGNOSIS — N1831 Chronic kidney disease, stage 3a: Secondary | ICD-10-CM | POA: Diagnosis not present

## 2020-09-12 DIAGNOSIS — R339 Retention of urine, unspecified: Secondary | ICD-10-CM | POA: Diagnosis not present

## 2020-09-19 DIAGNOSIS — M87059 Idiopathic aseptic necrosis of unspecified femur: Secondary | ICD-10-CM | POA: Diagnosis not present

## 2020-09-19 DIAGNOSIS — I2510 Atherosclerotic heart disease of native coronary artery without angina pectoris: Secondary | ICD-10-CM | POA: Diagnosis not present

## 2020-09-19 DIAGNOSIS — E782 Mixed hyperlipidemia: Secondary | ICD-10-CM | POA: Diagnosis not present

## 2020-09-20 DEATH — deceased

## 2020-10-08 ENCOUNTER — Ambulatory Visit: Payer: Medicare Other | Admitting: Urology

## 2020-11-11 ENCOUNTER — Ambulatory Visit (HOSPITAL_BASED_OUTPATIENT_CLINIC_OR_DEPARTMENT_OTHER): Payer: Medicare Other | Admitting: Cardiovascular Disease
